# Patient Record
Sex: Female | Born: 1940 | Race: White | Hispanic: No | Smoking: Former smoker
Health system: Southern US, Community
[De-identification: ages and names within clinical notes are randomized; demographics above are authoritative.]

## PROBLEM LIST (undated history)

## (undated) DIAGNOSIS — H9190 Unspecified hearing loss, unspecified ear: Secondary | ICD-10-CM

## (undated) DIAGNOSIS — N183 Chronic kidney disease, stage 3 unspecified: Secondary | ICD-10-CM

## (undated) DIAGNOSIS — I10 Essential (primary) hypertension: Secondary | ICD-10-CM

## (undated) DIAGNOSIS — F329 Major depressive disorder, single episode, unspecified: Secondary | ICD-10-CM

## (undated) DIAGNOSIS — K219 Gastro-esophageal reflux disease without esophagitis: Secondary | ICD-10-CM

## (undated) DIAGNOSIS — H269 Unspecified cataract: Secondary | ICD-10-CM

## (undated) DIAGNOSIS — M542 Cervicalgia: Secondary | ICD-10-CM

## (undated) DIAGNOSIS — G2581 Restless legs syndrome: Secondary | ICD-10-CM

## (undated) DIAGNOSIS — J45909 Unspecified asthma, uncomplicated: Secondary | ICD-10-CM

## (undated) DIAGNOSIS — M1990 Unspecified osteoarthritis, unspecified site: Secondary | ICD-10-CM

## (undated) DIAGNOSIS — R200 Anesthesia of skin: Secondary | ICD-10-CM

## (undated) DIAGNOSIS — F32A Depression, unspecified: Secondary | ICD-10-CM

## (undated) DIAGNOSIS — M5430 Sciatica, unspecified side: Secondary | ICD-10-CM

## (undated) HISTORY — DX: Chronic kidney disease, stage 3 (moderate): N18.3

## (undated) HISTORY — DX: Unspecified hearing loss, unspecified ear: H91.90

## (undated) HISTORY — PX: TOOTH EXTRACTION: SUR596

## (undated) HISTORY — DX: Unspecified cataract: H26.9

## (undated) HISTORY — PX: EYE SURGERY: SHX253

## (undated) HISTORY — DX: Depression, unspecified: F32.A

## (undated) HISTORY — PX: OTHER SURGICAL HISTORY: SHX169

## (undated) HISTORY — DX: Cervicalgia: M54.2

## (undated) HISTORY — DX: Essential (primary) hypertension: I10

## (undated) HISTORY — DX: Anesthesia of skin: R20.0

## (undated) HISTORY — DX: Unspecified osteoarthritis, unspecified site: M19.90

## (undated) HISTORY — DX: Chronic kidney disease, stage 3 unspecified: N18.30

## (undated) HISTORY — DX: Gastro-esophageal reflux disease without esophagitis: K21.9

## (undated) HISTORY — DX: Unspecified asthma, uncomplicated: J45.909

## (undated) HISTORY — DX: Restless legs syndrome: G25.81

## (undated) HISTORY — PX: CATARACT EXTRACTION, BILATERAL: SHX1313

## (undated) HISTORY — DX: Sciatica, unspecified side: M54.30

## (undated) HISTORY — DX: Major depressive disorder, single episode, unspecified: F32.9

---

## 1999-07-12 ENCOUNTER — Encounter: Payer: Self-pay | Admitting: Family Medicine

## 1999-07-12 ENCOUNTER — Ambulatory Visit (HOSPITAL_COMMUNITY): Admission: RE | Admit: 1999-07-12 | Discharge: 1999-07-12 | Payer: Self-pay | Admitting: Family Medicine

## 1999-08-28 ENCOUNTER — Other Ambulatory Visit: Admission: RE | Admit: 1999-08-28 | Discharge: 1999-08-28 | Payer: Self-pay | Admitting: Obstetrics & Gynecology

## 1999-08-28 ENCOUNTER — Encounter (INDEPENDENT_AMBULATORY_CARE_PROVIDER_SITE_OTHER): Payer: Self-pay

## 2000-07-31 ENCOUNTER — Ambulatory Visit (HOSPITAL_COMMUNITY): Admission: RE | Admit: 2000-07-31 | Discharge: 2000-07-31 | Payer: Self-pay | Admitting: Family Medicine

## 2000-07-31 ENCOUNTER — Encounter: Payer: Self-pay | Admitting: Family Medicine

## 2001-08-19 ENCOUNTER — Ambulatory Visit (HOSPITAL_COMMUNITY): Admission: RE | Admit: 2001-08-19 | Discharge: 2001-08-19 | Payer: Self-pay | Admitting: Family Medicine

## 2001-08-19 ENCOUNTER — Encounter: Payer: Self-pay | Admitting: Family Medicine

## 2002-09-13 ENCOUNTER — Encounter: Payer: Self-pay | Admitting: Family Medicine

## 2002-09-13 ENCOUNTER — Ambulatory Visit (HOSPITAL_COMMUNITY): Admission: RE | Admit: 2002-09-13 | Discharge: 2002-09-13 | Payer: Self-pay | Admitting: Family Medicine

## 2003-09-26 ENCOUNTER — Ambulatory Visit (HOSPITAL_COMMUNITY): Admission: RE | Admit: 2003-09-26 | Discharge: 2003-09-26 | Payer: Self-pay | Admitting: Family Medicine

## 2004-03-06 ENCOUNTER — Ambulatory Visit (HOSPITAL_COMMUNITY): Admission: RE | Admit: 2004-03-06 | Discharge: 2004-03-06 | Payer: Self-pay | Admitting: Gastroenterology

## 2004-10-30 ENCOUNTER — Ambulatory Visit (HOSPITAL_COMMUNITY): Admission: RE | Admit: 2004-10-30 | Discharge: 2004-10-30 | Payer: Self-pay | Admitting: Family Medicine

## 2004-11-13 ENCOUNTER — Encounter: Admission: RE | Admit: 2004-11-13 | Discharge: 2004-11-13 | Payer: Self-pay | Admitting: Family Medicine

## 2005-12-30 ENCOUNTER — Encounter: Admission: RE | Admit: 2005-12-30 | Discharge: 2005-12-30 | Payer: Self-pay | Admitting: Family Medicine

## 2006-07-31 ENCOUNTER — Ambulatory Visit (HOSPITAL_BASED_OUTPATIENT_CLINIC_OR_DEPARTMENT_OTHER): Admission: RE | Admit: 2006-07-31 | Discharge: 2006-07-31 | Payer: Self-pay | Admitting: Orthopedic Surgery

## 2007-02-04 ENCOUNTER — Encounter: Admission: RE | Admit: 2007-02-04 | Discharge: 2007-02-04 | Payer: Self-pay | Admitting: Family Medicine

## 2007-03-19 ENCOUNTER — Ambulatory Visit: Payer: Self-pay | Admitting: Critical Care Medicine

## 2007-04-30 ENCOUNTER — Ambulatory Visit: Payer: Self-pay | Admitting: Critical Care Medicine

## 2008-03-01 ENCOUNTER — Encounter: Admission: RE | Admit: 2008-03-01 | Discharge: 2008-03-01 | Payer: Self-pay | Admitting: Family Medicine

## 2009-03-28 ENCOUNTER — Encounter: Admission: RE | Admit: 2009-03-28 | Discharge: 2009-03-28 | Payer: Self-pay | Admitting: Family Medicine

## 2010-02-05 ENCOUNTER — Encounter: Admission: RE | Admit: 2010-02-05 | Discharge: 2010-02-05 | Payer: Self-pay | Admitting: Family Medicine

## 2010-12-14 ENCOUNTER — Encounter: Payer: Self-pay | Admitting: Family Medicine

## 2011-03-25 ENCOUNTER — Other Ambulatory Visit: Payer: Self-pay | Admitting: Family Medicine

## 2011-03-25 DIAGNOSIS — Z1231 Encounter for screening mammogram for malignant neoplasm of breast: Secondary | ICD-10-CM

## 2011-03-27 ENCOUNTER — Ambulatory Visit
Admission: RE | Admit: 2011-03-27 | Discharge: 2011-03-27 | Disposition: A | Discharge status: Home / Self Care | Payer: Medicare Other | Source: Ambulatory Visit | Attending: Family Medicine | Admitting: Family Medicine

## 2011-03-27 DIAGNOSIS — Z1231 Encounter for screening mammogram for malignant neoplasm of breast: Secondary | ICD-10-CM

## 2011-04-08 NOTE — Assessment & Plan Note (Signed)
[facility]                             PULMONARY OFFICE NOTE   MEESHA, SEK                      MRN:          [id number]  DATE:04/30/2007                            DOB:          12-14-1940    Ms. Wisner returns in followup. A 70 year old female with asthmatic  bronchitis, chronic obstructive lung disease, reflux disease. She states  the Zegerid has not been much better than the omeprazole that she was on  previously. She is on;  1. Advair 500/50 one spray b.i.d.  2. Spiriva daily.  3. Singular 10 mg daily.  4. She is off __________ .   On exam, temperature 98, blood pressure 154/80, pulse 91, saturation 95%  room air.  CHEST: Showed few scattered expired wheezes, poor air flow.  CARDIAC EXAM: Showed a regular rate and rhythm without S3. Normal S1,  S2.  ABDOMEN: Soft, nontender.  EXTREMITIES: Showed no edema or clubbing.  SKIN: Clear.   IMPRESSION:  That of acute bronchitis with chronic obstructive pulmonary  disease flare.   PLAN:  For the patient to receive doxicycline 100 mg twice day for 7  days, pulse prednisone 40 mg a day, taper down by 1 every 4 days until  off. She will discontinue Zegerid and restart omeprazole 20 mg b.i.d.  Maintain Advair and Spiriva as is, and we will see the patient back in  follow up.     Charlcie Cradle Delford Field, MD, [facility]  Electronically Signed    PEW/MedQ  DD: 04/30/2007  DT: 04/30/2007  Job #: [id number]   cc:   Gregary Signs A. Everardo All, MD

## 2011-04-11 NOTE — Op Note (Signed)
[name], [name]                         ACCOUNT NO.:  [id number]   MEDICAL RECORD NO.:  [id number]                   PATIENT TYPE:  AMB   LOCATION:  ENDO                                 FACILITY:  [facility]   PHYSICIAN:  Anselmo Rod, M.D.               DATE OF BIRTH:  03-08-1941   DATE OF PROCEDURE:  03/06/2004  DATE OF DISCHARGE:                                 OPERATIVE REPORT   PROCEDURE PERFORMED:  Screening colonoscopy.   ENDOSCOPIST:  Charna Elizabeth, M.D.   INSTRUMENT USED:  Olympus video colonoscope.   INDICATIONS FOR PROCEDURE:  The patient is a 70 year old white female  undergoing screening colonoscopy to rule out colonic polyps, masses, etc.   PREPROCEDURE PREPARATION:  Informed consent was procured from the patient.  The patient was fasted for eight hours prior to the procedure and prepped  with a bottle of magnesium citrate and a gallon of GoLYTELY the night prior  to the procedure.   PREPROCEDURE PHYSICAL:  The patient had stable vital signs.  Neck supple.  Chest clear to auscultation.  S1 and S2 regular.  Abdomen soft with normal  bowel sounds.   DESCRIPTION OF PROCEDURE:  The patient was placed in left lateral decubitus  position and sedated with 125 mg of Demerol and 15 mg of Versed  intravenously.  Once the patient was adequately sedated and maintained on  low flow oxygen and continuous cardiac monitoring, the Olympus video  colonoscope was advanced from the rectum to sigmoid colon with difficulty.  There was extensive diverticulosis of the sigmoid colon with spasm.  The  patient's position was changed in several locations from the left lateral to  the supine and the right lateral position with gentle application of  abdominal pressure.  The adult scope was then changed to an adjustable  pediatric scope and the scope gently advanced up to the cecum.  There was a  large amount of residual stool in the colon and multiple washes were done.  Small lesions  could have been missed.  The patient tolerated the procedure  well without immediate complications.  Retroflexion in the rectum revealed  no abnormalities.   IMPRESSION:  1. Extensive diverticulosis with most prominent changes in the sigmoid     colon.  2. No masses or polyps seen.  3. A large amount of residual stool in the colon.  Small lesions could have     been missed.  The patient had a very difficult procedure.   RECOMMENDATIONS:  1. Repeat barium enema should be done in the next five years unless the     patient develops any abnormal symptoms     in the interim.  2. Continue high fiber diet with liberal fluid intake.  Brochures on     diverticulosis have been given to the patient for her education.  Anselmo Rod, M.D.    JNM/MEDQ  D:  03/06/2004  T:  03/07/2004  Job:  [id number]   cc:   Marjory Lies, M.D.  [address]  [place]  [place] [address]  Fax: [phone]

## 2011-04-11 NOTE — Assessment & Plan Note (Signed)
[facility]                             PULMONARY OFFICE NOTE   HUNTLEIGH, DOOLEN                      MRN:          [id number]  DATE:03/19/2007                            DOB:          1941-09-27    CHIEF COMPLAINT:  Evaluation asthma.   HISTORY OF PRESENT ILLNESS:  A 70 year old white female who I have  actually seen previously in 1996, and at that time diagnosed this  patient as having COPD and chronic asthmatic bronchitis with smoking.  She smoked 39 years, a pack a day.  She did eventually quit in 1999.  I  have not seen her in over 10 years.  In the interim, she has had dry  cough with chest discomfort and pain with coughing and also reflux  symptom-like complex.  She breaks through with acid heartburn on the  omeprazole at 20 mg b.i.d.  She has chest tightness, there is no  wheezing.  She is short of breath with exertion.  She is not short of  breath at rest.  She does have some postnasal drainage.  She has been on  Advair for 4 years, 250/50 strength 1 spray b.i.d.  She is also on the  Singulair 10 mg daily, and mucous relief over the counter 2 every 8  hours.  She also maintains Uniphyl 800 mg daily.  She is referred for  further evaluation.   PAST MEDICAL HISTORY:  1. Medical history of asthmatic bronchitis, diagnosed since 41.  2. History of chronic allergies.  3. Acid reflux disease.  4. Arm surgery for dog bite.  No major surgical issues noted other      than this.   SOCIAL HISTORY:  Retired, lives with her husband.   FAMILY HISTORY:  Heart disease in mother, father had MI.  Mother had  clotting disorder.  Sister had cancer.   REVIEW OF SYSTEMS:  Otherwise noncontributory.   CURRENT MEDICATIONS:  1. Advair 250/50 one spray b.i.d.  2. Bupropion XL 300 mg daily.  3. Allegra 180 mg daily.  4. Fosamax weekly.  5. Omeprazole 20 mg b.i.d.  6. Singulair 10 mg daily.  7. Uniphyl 800 mg daily.  8. Aspirin 81 mg daily.  9.  Magnesium daily.  10.Glucosamine 2 daily.  11.Mucous relief 2 every 8 hours.  12.Tylenol p.r.n.   LABORATORY DATA:  Chest x-ray was obtained and reviewed and showed COPD  changes but no acute infiltrates seen.  Spirometry was obtained today in  the office showing an FEV1 of 1.83, FEC of 2.62, FEV1 FEC ratio 70%.   IMPRESSION:  1. Asthmatic bronchitis, ex-smoker.  2. Chronic obstructive lung disease.  3. Severe reflux disease, exacerbated by Uniphyl use.   PLAN:  Add Spiriva 1 capsule 2 sprays daily, discontinue further  Uniphyl.  Increase the Advair to 500/50 one spray b.i.d.  Patient was  reinstructed as to the proper use of Advair and the Spiriva, and we will  see this patient back in return followup in 1 month.     Charlcie Cradle Delford Field, MD, [facility]  Electronically Signed    PEW/MedQ  DD: 03/19/2007  DT: 03/19/2007  Job #: [id number]   cc:   Marjory Lies, M.D.

## 2011-04-11 NOTE — Op Note (Signed)
[name], [name]             ACCOUNT NO.:  [id number]   MEDICAL RECORD NO.:  [id number]          PATIENT TYPE:  AMB   LOCATION:  DSC                          FACILITY:  [facility]   PHYSICIAN:  Katy Fitch. Sypher, M.D. DATE OF BIRTH:  25-Aug-1941   DATE OF PROCEDURE:  07/31/2006  DATE OF DISCHARGE:                                 OPERATIVE REPORT   PREOPERATIVE DIAGNOSES:  Complex dog bite wounds right forearm sustained on  July 28, 2006 with prior debridement, whirlpool therapy and antibiotic  therapy x72 hours, now presenting for delayed primary closure of stable  wounds.   POSTOPERATIVE DIAGNOSES:  Complex dog bite wounds right forearm sustained on  July 28, 2006 with prior debridement, whirlpool therapy and antibiotic  therapy x72 hours, now presenting for delayed primary closure of stable  wounds.   OPERATION:  1. Excisional debridement of two dorsal wounds right forearm, one      measuring 5 cm in length.  The second measuring 3.5 cm in length with      layered closure with 4-0 Vicryl and intradermal 4-0 Prolene.  2. Closure of three volar forearm wounds with intradermal 3-0 Prolene,      each measuring between 1 and 1.5 cm   SURGEON:  Dr. Josephine Igo.   ASSISTANT:  Annye Rusk PA-C.   ANESTHESIA:  General by LMA, supervising anesthesiologist Dr. Sampson Goon.   INDICATIONS:  [name] is a 65-year woman referred through the  courtesy of Dr. Marjory Lies of [facility] practice for evaluation  and management of untidy dog bite wounds to the right forearm.   Ms. Mount was involved in a altercation when her dog and another dog  became involved in a dog fight on July 28, 2006.  In breaking up the  fight, she sustained a deep bite wound to her right dorsal and volar  forearm.  She believes her own dog was the one that accidentally caused her  injuries.  She had a tearing untidy pair of wounds on the dorsal aspect of  her forearm measuring 5  cm and approximately 3-1/2 cm in length down to the  muscle fascia.  These were very untidy on July 28, 2006.  She had three  significant bite wound puncture wound lacerations measuring between 1.5 and  1 cm on the volar surface of her forearm and 2 smaller superficial bite  wounds.   She was seen on an urgent basis on July 28, 2006 at which time her  wounds were cleaned under local anesthesia in the office and she was treated  with whirlpool therapy.  She has been on oral Augmentin antibiotic therapy  x72 hours and now presents for delayed primary closure of her wounds.   After informed consent, she is brought to the operating room at this time.   DESCRIPTION OF PROCEDURE:  Viktoria Gruetzmacher is brought to the operating room  and placed in supine position on the operating table.  Following an  anesthesia consult by Dr. Sampson Goon, general anesthesia by LMA was  selected.   The right arm was prepped with Betadine soap solution and  sterilely draped.  A pneumatic tourniquet was applied to the proximal right brachium.  Following elevation of the arm for 1 minute, the arterial tourniquet was  inflated to 230 mmHg.  The procedure commenced with excisional debridement  of the inflammatory margins of her dorsal wounds.  These were  circumferentially debrided down to the subcutaneous fat.  They were then  probed with a blunt scissors and irrigated thoroughly.  No purulent material  was recovered.  There was no area of loculation noted.   The wounds were then repaired with subdermal and subcutaneous sutures of 4-0  Vicryl followed by intradermal segmental 4-0 Prolene.  The wounds were then  drained with Vesseloop drains extending from a puncture wound out the main  traumatic wound.   Ms. Speece has extremely fragile senile skin and is at some risk for  secondary wound breakdown due to the poor quality of her skin.   The volar wounds were then addressed.  All three wounds were  irrigated and  probed.  No purulent material was recovered.  The wounds were then repaired  with intradermal 4-0 Prolene.   All wounds were dressed with Adaptic sterile gauze, ABD pads and an Ace  wrap.  There were no apparent complications.   Ms. Hockenberry was given 1 gram of Ancef as a supplementary IV antibiotic  intraoperatively.  She will continue with her Augmentin 875 mg p.o. b.i.d.  for a total of 7 days.  She will return to see [place] in follow-up in the office  on August 03, 2006 for a wound check or contact us sooner p.r.n. problems  with her medication, fever or any potential wound complication.      Katy Fitch Sypher, M.D.  Electronically Signed     RVS/MEDQ  D:  07/31/2006  T:  07/31/2006  Job:  [id number]   cc:   Marjory Lies, M.D.

## 2013-06-23 ENCOUNTER — Other Ambulatory Visit: Payer: Self-pay

## 2013-06-23 DIAGNOSIS — Z1231 Encounter for screening mammogram for malignant neoplasm of breast: Secondary | ICD-10-CM

## 2013-06-30 ENCOUNTER — Ambulatory Visit
Admission: RE | Admit: 2013-06-30 | Discharge: 2013-06-30 | Disposition: A | Discharge status: Home / Self Care | Payer: Medicare Other | Source: Ambulatory Visit

## 2013-06-30 DIAGNOSIS — Z1231 Encounter for screening mammogram for malignant neoplasm of breast: Secondary | ICD-10-CM

## 2014-06-14 ENCOUNTER — Other Ambulatory Visit: Payer: Self-pay | Admitting: Otolaryngology

## 2014-06-14 DIAGNOSIS — M542 Cervicalgia: Secondary | ICD-10-CM

## 2014-06-29 ENCOUNTER — Ambulatory Visit
Admission: RE | Admit: 2014-06-29 | Discharge: 2014-06-29 | Disposition: A | Discharge status: Home / Self Care | Payer: Medicare Other | Source: Ambulatory Visit | Attending: Otolaryngology | Admitting: Otolaryngology

## 2014-06-29 DIAGNOSIS — M542 Cervicalgia: Secondary | ICD-10-CM

## 2015-10-10 ENCOUNTER — Other Ambulatory Visit: Payer: Self-pay | Admitting: Family Medicine

## 2015-10-10 DIAGNOSIS — Z1231 Encounter for screening mammogram for malignant neoplasm of breast: Secondary | ICD-10-CM

## 2015-10-30 ENCOUNTER — Ambulatory Visit
Admission: RE | Admit: 2015-10-30 | Discharge: 2015-10-30 | Disposition: A | Discharge status: Home / Self Care | Payer: Medicare Other | Source: Ambulatory Visit | Attending: Family Medicine | Admitting: Family Medicine

## 2015-10-30 DIAGNOSIS — Z1231 Encounter for screening mammogram for malignant neoplasm of breast: Secondary | ICD-10-CM

## 2016-06-03 DIAGNOSIS — J449 Chronic obstructive pulmonary disease, unspecified: Secondary | ICD-10-CM | POA: Insufficient documentation

## 2016-06-03 DIAGNOSIS — I10 Essential (primary) hypertension: Secondary | ICD-10-CM | POA: Insufficient documentation

## 2016-06-03 DIAGNOSIS — F329 Major depressive disorder, single episode, unspecified: Secondary | ICD-10-CM | POA: Diagnosis present

## 2016-06-03 DIAGNOSIS — K219 Gastro-esophageal reflux disease without esophagitis: Secondary | ICD-10-CM | POA: Insufficient documentation

## 2016-06-03 DIAGNOSIS — N183 Chronic kidney disease, stage 3 unspecified: Secondary | ICD-10-CM | POA: Diagnosis present

## 2016-06-03 DIAGNOSIS — H9190 Unspecified hearing loss, unspecified ear: Secondary | ICD-10-CM | POA: Insufficient documentation

## 2017-07-30 ENCOUNTER — Encounter: Payer: Self-pay | Admitting: Neurology

## 2017-07-30 ENCOUNTER — Ambulatory Visit (INDEPENDENT_AMBULATORY_CARE_PROVIDER_SITE_OTHER): Payer: Medicare Other | Admitting: Neurology

## 2017-07-30 DIAGNOSIS — M545 Low back pain, unspecified: Secondary | ICD-10-CM | POA: Insufficient documentation

## 2017-07-30 DIAGNOSIS — G8929 Other chronic pain: Secondary | ICD-10-CM | POA: Diagnosis not present

## 2017-07-30 DIAGNOSIS — R202 Paresthesia of skin: Secondary | ICD-10-CM | POA: Diagnosis not present

## 2017-07-30 MED ORDER — GABAPENTIN 100 MG PO CAPS: 100.0000 mg | ORAL_CAPSULE | Freq: Three times a day (TID) | ORAL | 11 refills | Status: DC

## 2017-07-30 NOTE — Progress Notes (Signed)
PATIENT: [name] DOB: 03-Nov-1941  Chief Complaint  Patient presents with  . Numbness    Reports having numbness on the bottom of both feet.  She is also experiencing body restlessness.  She is taking ibuprofen PM at night due to her symptoms interrupting her sleep.  Marland Kitchen PCP    [name] Found, MD     HISTORICAL  [name] is [name] 76 year old female, seen in refer by her primary care doctor  [name], for evaluation of numbness in the bottom of her feet, initial evaluation was on September 6th 2018.  I reviewed and summarized referring note, she has past medical history of hypertension, asthma, depression, acid reflux, restless leg,  She reported history of chronic low back pain, around 2015 she noticed numbness of her left foot, starting at the plantar surface, gradually had involvement of her whole left foot, since January 2016, she also noticed similar involvement of right foot, she described intermittent rising sensation starting from bottom of her feet, spreading rostrally to involving her ankle, leg, spine, pelvic area, even to chest level, the rising sensation described as hot flashing, like menopause, lasting for few minutes, then gradually subsided, she denied loss of consciousness, it only happened in the sitting position, when she tries to go to bed, she tends to having her feet on the floor, or rubbing her feet with over-the-counter cream  She also complains of chronic neck pain, was diagnosed with right ulnar neuropathy when she presented with right fifth finger numbness, she had right ulnar transposition of surgery without helping her right fifth finger paresthesia  She denies significant gait abnormality and no bowel bladder incontinence  In between episodes, she denies significant pain, no bowel and bladder incontinence,  Laboratory evaluations B12 515, normal CMP, CBC, hemoglobin 14 point 3, A1C 5.3  REVIEW OF SYSTEMS: Full 14 system review of  systems performed and notable only for restless leg, numbness, hearing loss, ringing ears,   ALLERGIES: No Known Allergies  HOME MEDICATIONS: Current Outpatient Prescriptions  Medication Sig Dispense Refill  . Ascorbic Acid (VITAMIN C) 1000 MG tablet Take 1,000 mg by mouth.    Marland Kitchen aspirin EC 81 MG tablet Take by mouth.    . B Complex Vitamins (VITAMIN-B COMPLEX) TABS Take by mouth.    Marland Kitchen buPROPion (WELLBUTRIN XL) 300 MG 24 hr tablet TAKE 1 TABLET BY MOUTH  DAILY    . CALCIUM PO Take 1,200 mg by mouth.    . Dextromethorphan-Guaifenesin (MUCINEX DM PO) Take by mouth as needed.    . diphenhydrAMINE (BENADRYL) 25 mg capsule Take 100 mg by mouth daily.    Marland Kitchen esomeprazole (NEXIUM) 40 MG capsule TAKE 1 CAPSULE BY MOUTH  DAILY    . Fluticasone-Salmeterol (ADVAIR DISKUS) 500-50 MCG/DOSE AEPB USE 1 INHALATION TWO TIMES  DAILY    . Glucosamine-Chondroitin (MOVE FREE PO) Take 1 tablet by mouth daily.    Marland Kitchen ibuprofen (ADVIL,MOTRIN) 200 MG tablet Take 200 mg by mouth as needed.    . Ibuprofen-Diphenhydramine Cit (IBUPROFEN PM PO) Take 2 tablets by mouth at bedtime.    Marland Kitchen losartan-hydrochlorothiazide (HYZAAR) 50-12.5 MG tablet TAKE 1 TABLET BY MOUTH  DAILY AS DIRECTED    . MAGNESIUM PO Take 3 tablets by mouth daily.    . montelukast (SINGULAIR) 10 MG tablet TAKE 1 TABLET BY MOUTH  DAILY AS DIRECTED    . Multiple Vitamin (MULTI-VITAMINS) TABS Take by mouth.    . Omega-3 Fatty Acids (FISH OIL PO) Take  2,400 mg by mouth daily.    Marland Kitchen SIMETHICONE PO Take 180 mg by mouth as needed.    . theophylline (UNIPHYL) 400 MG 24 hr tablet TAKE 1 TABLET BY MOUTH  DAILY AS DIRECTED    . vitamin E 400 UNIT capsule Take by mouth.     No current facility-administered medications for this visit.     PAST MEDICAL HISTORY: Past Medical History:  Diagnosis Date  . Arthritis   . Asthma   . Chronic renal impairment, stage 3 (moderate)   . Depression   . GERD (gastroesophageal reflux disease)   . Hearing loss   .  Hypertension   . Neck pain   . Numbness   . Restless legs   . Sciatica     PAST SURGICAL HISTORY: Past Surgical History:  Procedure Laterality Date  . arm surgery     due to dog bite  . CATARACT EXTRACTION, BILATERAL    . OTHER SURGICAL HISTORY     bone graft - jaw   . TOOTH EXTRACTION      FAMILY HISTORY: Family History  Problem Relation Age of Onset  . Stroke Mother   . Hypertension Father   . Heart disease Father   . Heart attack Father   . Cancer Sister        unsure of type    SOCIAL HISTORY:  Social History   Social History  . Marital status: Married    Spouse name: N/[name]  . Number of children: 2  . Years of education: associates degree   Occupational History  . Retired    Social History Main Topics  . Smoking status: Former Smoker    Quit date: 1999  . Smokeless tobacco: Never Used  . Alcohol use Yes     Comment: 2 drinks per day  . Drug use: No  . Sexual activity: Not on file   Other Topics Concern  . Not on file   Social History Narrative   Lives at home with husband.   Right-handed.   No caffeine use.     PHYSICAL EXAM   Vitals:   07/30/17 1057  BP: (!) 178/98  Pulse: 98  Weight: 189 lb (85.7 kg)  Height:  (1.651 m)    Not recorded      Body mass index is 31.45 kg/m.  PHYSICAL EXAMNIATION:  Gen: NAD, conversant, well nourised, obese, well groomed                     Cardiovascular: Regular rate rhythm, no peripheral edema, warm, nontender. Eyes: Conjunctivae clear without exudates or hemorrhage Neck: Supple, no carotid bruits. Pulmonary: Clear to auscultation bilaterally   NEUROLOGICAL EXAM:  MENTAL STATUS: Speech:    Speech is normal; fluent and spontaneous with normal comprehension.  Cognition:     Orientation to time, place and person     Normal recent and remote memory     Normal Attention span and concentration     Normal Language, naming, repeating,spontaneous speech     Fund of knowledge   CRANIAL  NERVES: CN II: Visual fields are full to confrontation. Fundoscopic exam is normal with sharp discs and no vascular changes. Pupils are round equal and briskly reactive to light. CN III, IV, VI: extraocular movement are normal. No ptosis. CN V: Facial sensation is intact to pinprick in all 3 divisions bilaterally. Corneal responses are intact.  CN VII: Face is symmetric with normal eye closure and smile. CN  VIII: Mildly decreased hearing bilaterally  CN IX, X: Palate elevates symmetrically. Phonation is normal. CN XI: Head turning and shoulder shrug are intact CN XII: Tongue is midline with normal movements and no atrophy.  MOTOR: There is no pronator drift of out-stretched arms. Muscle bulk and tone are normal. Muscle strength is normal.  REFLEXES: Reflexes are 2+ and symmetric at the biceps, triceps, knees, and absent at ankles.Plantar responses are flexor.  SENSORY: Intact to light touch, pinprick, positional sensation and vibratory sensation are intact in fingers, with exception of decreased light touch at the right fifth finger, decreased vibratory sensation at toes   COORDINATION: Rapid alternating movements and fine finger movements are intact. There is no dysmetria on finger-to-nose and heel-knee-shin.    GAIT/STANCE: Posture is normal. Gait is steady with normal steps, base, arm swing, and turning. Heel and toe walking are normalMild difficulty with tandem walking.  Romberg is absent.   DIAGNOSTIC DATA (LABS, IMAGING, TESTING) - I reviewed patient records, labs, notes, testing and imaging myself where available.   ASSESSMENT AND PLAN  [name] is [name] 76 y.o. female   Bilateral lower extremity paresthesia, chronic low back pain Differentiation diagnosis including peripheral neuropathy versus lumbosacral radiculopathy  Proceed with MRI of lumbar spine  EMG nerve conduction study   Gabapentin 100 mg 3 times [name] day    Levert FeinsteinYijun Krystena Reitter, M.D. Ph.D.  [facility] [address], [address] [place], [place] [address] Ph: [phone] Fax: [phone]  CC: [name] FoundEksir, Samantha A, MD

## 2017-08-14 ENCOUNTER — Encounter: Payer: Medicare Other | Admitting: Neurology

## 2017-08-19 ENCOUNTER — Ambulatory Visit
Admission: RE | Admit: 2017-08-19 | Discharge: 2017-08-19 | Disposition: A | Discharge status: Home / Self Care | Payer: Medicare Other | Source: Ambulatory Visit | Attending: Neurology | Admitting: Neurology

## 2017-08-19 DIAGNOSIS — M545 Low back pain: Secondary | ICD-10-CM | POA: Diagnosis not present

## 2017-08-19 DIAGNOSIS — R202 Paresthesia of skin: Secondary | ICD-10-CM

## 2017-08-19 DIAGNOSIS — G8929 Other chronic pain: Secondary | ICD-10-CM

## 2017-08-21 ENCOUNTER — Telehealth: Payer: Self-pay | Admitting: Neurology

## 2017-08-21 NOTE — Telephone Encounter (Signed)
  Please call patient MRI of lumbar spine showed multilevel degenerative changes, with evidence of spinal stenosis at L4-5, L3-4, and evidence of variable foraminal stenosis, I will review MRI films at her next visit on August 28 2017  IMPRESSION:  Abnormal MRI lumbar spine (without) demonstrating: 1. At L4-5: disc bulging and facet hypertrophy with severe spinal stenosis and mild biforaminal stenosis  2. At L3-4: disc bulging and facet hypertrophy with moderate-severe spinal stenosis and mild biforaminal stenosis  3. At L2-3: disc bulging and facet hypertrophy with mild spinal stenosis and no foraminal stenosis

## 2017-08-24 NOTE — Telephone Encounter (Signed)
Attempted call again.  Left another message requesting a return call.

## 2017-08-24 NOTE — Telephone Encounter (Signed)
Left message for patient. 10/1/20184:54 PM  mck

## 2017-08-25 NOTE — Telephone Encounter (Signed)
Left third message for patient.  Provided our call back number if she would like to discuss result over phone. Otherwise, she has an appt on 08/28/17 and Dr. Terrace Arabia will review them at that time.

## 2017-08-28 ENCOUNTER — Ambulatory Visit (INDEPENDENT_AMBULATORY_CARE_PROVIDER_SITE_OTHER): Payer: Medicare Other | Admitting: Neurology

## 2017-08-28 DIAGNOSIS — G8929 Other chronic pain: Secondary | ICD-10-CM | POA: Diagnosis not present

## 2017-08-28 DIAGNOSIS — M545 Low back pain: Secondary | ICD-10-CM | POA: Diagnosis not present

## 2017-08-28 DIAGNOSIS — R202 Paresthesia of skin: Secondary | ICD-10-CM

## 2017-08-28 DIAGNOSIS — M5416 Radiculopathy, lumbar region: Secondary | ICD-10-CM | POA: Diagnosis not present

## 2017-08-28 DIAGNOSIS — G629 Polyneuropathy, unspecified: Secondary | ICD-10-CM | POA: Insufficient documentation

## 2017-08-28 DIAGNOSIS — G6289 Other specified polyneuropathies: Secondary | ICD-10-CM | POA: Diagnosis not present

## 2017-08-28 NOTE — Procedures (Signed)
Full Name: [name] Gender: Female MRN #: [id number] Date of Birth: November 27, 1940    Visit Date: 08/28/2017 10:07 Age: 76 Years 1 Months Old Examining Physician: Levert Feinstein, MD  Referring Physician: Terrace Arabia, MD History: 76 years old female complains of bilateral feet paresthesia, radiating pain to bilateral upper extremity, she also has chronic low back pain  Summary of the test:  Nerve conduction study: Bilateral sural, superficial peroneal sensory responses were absent.  Left median, ulnar sensory responses were normal.  Bilateral tibial, left peroneal to EDB motor responses showed severely decreased C map amplitude. Right peroneal to EDB motor response was absent.  Left ulnar motor responses were normal. Right median motor responses showed mildly decreased to C map amplitude.  Electromyography: Selected needle examination of bilateral Lower extremity muscles and bilateral lumbar sacral paraspinal muscle were performed.  There is evidence of chronic neuropathic changes involving bilateral lower extremity muscles, bilateral tibialis anterior, tibialis posterior, vastus lateralis. There is no evidence of active denervation and bilateral lumbar sacral paraspinal muscles.    Conclusion:  This is an abnormal study. There is electrodiagnostic evidence of mild length dependent axonal peripheral neuropathy, there is also evidence of chronic bilateral lumbosacral radiculopathy. There is no evidence of active process.   ------------------------------- Levert Feinstein, M.D.  [facility] [address] [place], [place] [address] Tel: [phone] Fax: [phone]        [facility]    Nerve / Sites Muscle Latency Ref. Amplitude Ref. Rel Amp Segments Distance Velocity Ref. Area    ms ms mV mV %  cm m/s m/s mVms  L Median - APB     Wrist APB 3.9 ?4.4 3.1 ?4.0 100 Wrist - APB 7   8.2     Upper arm APB 8.2  2.9  94.1 Upper arm - Wrist 24 55 ?49 7.1  L Ulnar - ADM     Wrist  ADM 2.6 ?3.3 9.6 ?6.0 100 Wrist - ADM 7   28.5     B.Elbow ADM 6.1  8.3  86.6 B.Elbow - Wrist 19 54 ?49 25.7     A.Elbow ADM 8.1  7.3  87.5 A.Elbow - B.Elbow 10 51 ?49 24.7         A.Elbow - Wrist      R Peroneal - EDB     Ankle EDB NR ?6.5 NR ?2.0 NR Ankle - EDB 9   NR     Fib head EDB NR  NR  NR Fib head - Ankle 31 NR ?44 NR         Pop fossa - Ankle      L Peroneal - EDB     Ankle EDB 5.2 ?6.5 0.3 ?2.0 100 Ankle - EDB 9   0.8     Fib head EDB 12.6  0.2  64.4 Fib head - Ankle 32 43 ?44 0.5     Pop fossa EDB 14.9  0.1  76.7 Pop fossa - Fib head 10 44 ?44 0.3         Pop fossa - Ankle      R Tibial - AH     Ankle AH 6.0 ?5.8 0.6 ?4.0 100 Ankle - AH 9   2.0     Pop fossa AH NR  NR  NR Pop fossa - Ankle   ?41 NR  L Tibial - AH     Ankle AH 5.3 ?5.8 0.3 ?4.0 100 Ankle - AH 9  1.5     Pop fossa AH NR  NR  NR Pop fossa - Ankle   ?41 NR                 SNC    Nerve / Sites Rec. Site Peak Lat Ref.  Amp Ref. Segments Distance Peak Diff Ref.    ms ms V V  cm ms ms  L Sural - Ankle (Calf)     Calf Ankle NR ?4.4 NR ?6 Calf - Ankle 14    R Sural - Ankle (Calf)     Calf Ankle NR ?4.4 NR ?6 Calf - Ankle 14    L Superficial peroneal - Ankle     Lat leg Ankle NR ?4.4 NR ?6 Lat leg - Ankle 14    R Superficial peroneal - Ankle     Lat leg Ankle NR ?4.4 NR ?6 Lat leg - Ankle 14    L Median, Ulnar - Transcarpal comparison     Median Palm Wrist 2.1 ?2.2 21 ?35 Median Palm - Wrist 8       Ulnar Palm Wrist 1.9 ?2.2 13 ?12 Ulnar Palm - Wrist 8          Median Palm - Ulnar Palm  0.2 ?0.4  L Median - Orthodromic (Dig II, Mid palm)     Dig II Wrist 3.2 ?3.4 12 ?10 Dig II - Wrist 13    L Ulnar - Orthodromic, (Dig V, Mid palm)     Dig V Wrist 2.7 ?3.1 8 ?5 Dig V - Wrist 20                     F  Wave    Nerve F Lat Ref.   ms ms  L Ulnar - ADM 29.3 ?32.0       EMG full       EMG Summary Table    Spontaneous MUAP Recruitment  Muscle IA Fib PSW Fasc Other Amp Dur. Poly Pattern  R. Tibialis  anterior Normal None None None _______ Normal Normal Normal Reduced  R. Peroneus longus Normal None None None _______ Normal Normal Normal Reduced  R. Vastus lateralis Normal None None None _______ Normal Normal Normal Reduced  L. Tibialis anterior Normal None None None _______ Normal Normal Normal Reduced  L. Tibialis posterior Normal None None None _______ Normal Normal Normal Reduced   L. Vastus lateralis Normal None None None _______ Normal Normal Normal Reduced  R. Lumbar paraspinals (low) Normal None None None _______ Normal Normal Normal Normal  R. Lumbar paraspinals (mid) Normal None None None _______ Normal Normal Normal Normal  L. Lumbar paraspinals (low) Normal None None None _______ Normal Normal Normal Normal  L. Lumbar paraspinals (mid) Normal None None None _______ Normal Normal Normal Normal

## 2017-08-28 NOTE — Progress Notes (Signed)
PATIENT: [name] DOB: [birth year]  HISTORICAL  [name] is a 76 year old female, seen in refer by her primary care doctor  Brett Fairy A, for evaluation of numbness in the bottom of her feet, initial evaluation was on September 6th 2018.  I reviewed and summarized referring note, she has past medical history of hypertension, asthma, depression, acid reflux, restless leg,  She reported history of chronic low back pain, around 2015 she noticed numbness of her left foot, starting at the plantar surface, gradually had involvement of her whole left foot, since January 2016, she also noticed similar involvement of right foot, she described intermittent rising sensation starting from bottom of her feet, spreading rostrally to involving her ankle, leg, spine, pelvic area, even to chest level, the rising sensation described as hot flashing, like menopause, lasting for few minutes, then gradually subsided, she denied loss of consciousness, it only happened in the sitting position, when she tries to go to bed, she tends to having her feet on the floor, or rubbing her feet with over-the-counter cream  She also complains of chronic neck pain, was diagnosed with right ulnar neuropathy when she presented with right fifth finger numbness, she had right ulnar transposition of surgery without helping her right fifth finger paresthesia  She denies significant gait abnormality and no bowel bladder incontinence  In between episodes, she denies significant pain, no bowel and bladder incontinence,  Laboratory evaluations B12 515, normal CMP, CBC, hemoglobin 14 point 3, A1C 5.3  UPDATE Aug 28 2017: she returned for electrodiagnostic study today, which showed evidence of mild axonal sensorimotor polyneuropathy, chronic bilateral lumbosacral radiculopathy  We have personally reviewed MRI of lumbar spine: Evidence of multilevel degenerative changes, 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  She has taking gabapentin 100 mg 3 times a day, which has been helpful  REVIEW OF SYSTEMS: Full 14 system review of systems performed and notable only for as above  ALLERGIES: No Known Allergies  HOME MEDICATIONS: Current Outpatient Prescriptions  Medication Sig Dispense Refill  . Ascorbic Acid (VITAMIN C) 1000 MG tablet Take 1,000 mg by mouth.    Marland Kitchen aspirin EC 81 MG tablet Take by mouth.    . B Complex Vitamins (VITAMIN-B COMPLEX) TABS Take by mouth.    Marland Kitchen buPROPion (WELLBUTRIN XL) 300 MG 24 hr tablet TAKE 1 TABLET BY MOUTH  DAILY    . CALCIUM PO Take 1,200 mg by mouth.    . Dextromethorphan-Guaifenesin (MUCINEX DM PO) Take by mouth as needed.    . diphenhydrAMINE (BENADRYL) 25 mg capsule Take 100 mg by mouth daily.    Marland Kitchen esomeprazole (NEXIUM) 40 MG capsule TAKE 1 CAPSULE BY MOUTH  DAILY    . Fluticasone-Salmeterol (ADVAIR DISKUS) 500-50 MCG/DOSE AEPB USE 1 INHALATION TWO TIMES  DAILY    . gabapentin (NEURONTIN) 100 MG capsule Take 1 capsule (100 mg total) by mouth 3 (three) times daily. 90 capsule 11  . Glucosamine-Chondroitin (MOVE FREE PO) Take 1 tablet by mouth daily.    Marland Kitchen ibuprofen (ADVIL,MOTRIN) 200 MG tablet Take 200 mg by mouth as needed.    . Ibuprofen-Diphenhydramine Cit (IBUPROFEN PM PO) Take 2 tablets by mouth at bedtime.    Marland Kitchen losartan-hydrochlorothiazide (HYZAAR) 50-12.5 MG tablet TAKE 1 TABLET BY MOUTH  DAILY AS DIRECTED    . MAGNESIUM PO Take 3 tablets by mouth daily.    . montelukast (SINGULAIR) 10 MG tablet TAKE 1 TABLET BY MOUTH  DAILY AS  DIRECTED    . Multiple Vitamin (MULTI-VITAMINS) TABS Take by mouth.    . Omega-3 Fatty Acids (FISH OIL PO) Take 2,400 mg by mouth daily.    Marland Kitchen SIMETHICONE PO Take 180 mg by mouth as needed.    . theophylline (UNIPHYL) 400 MG 24 hr tablet TAKE 1 TABLET BY MOUTH  DAILY AS DIRECTED    . vitamin E 400 UNIT capsule Take by mouth.     No current facility-administered medications for this visit.     PAST MEDICAL  HISTORY: Past Medical History:  Diagnosis Date  . Arthritis   . Asthma   . Chronic renal impairment, stage 3 (moderate)   . Depression   . GERD (gastroesophageal reflux disease)   . Hearing loss   . Hypertension   . Neck pain   . Numbness   . Restless legs   . Sciatica     PAST SURGICAL HISTORY: Past Surgical History:  Procedure Laterality Date  . arm surgery     due to dog bite  . CATARACT EXTRACTION, BILATERAL    . OTHER SURGICAL HISTORY     bone graft - jaw   . TOOTH EXTRACTION      FAMILY HISTORY: Family History  Problem Relation Age of Onset  . Stroke Mother   . Hypertension Father   . Heart disease Father   . Heart attack Father   . Cancer Sister        unsure of type    SOCIAL HISTORY:  Social History   Social History  . Marital status: Married    Spouse name: N/A  . Number of children: 2  . Years of education: associates degree   Occupational History  . Retired    Social History Main Topics  . Smoking status: Former Smoker    Quit date: 1999  . Smokeless tobacco: Never Used  . Alcohol use Yes     Comment: 2 drinks per day  . Drug use: No  . Sexual activity: Not on file   Other Topics Concern  . Not on file   Social History Narrative   Lives at home with husband.   Right-handed.   No caffeine use.     PHYSICAL EXAM   There were no vitals filed for this visit.  Not recorded      There is no height or weight on file to calculate BMI.  PHYSICAL EXAMNIATION:  Gen: NAD, conversant, well nourised, obese, well groomed                     Cardiovascular: Regular rate rhythm, no peripheral edema, warm, nontender. Eyes: Conjunctivae clear without exudates or hemorrhage Neck: Supple, no carotid bruits. Pulmonary: Clear to auscultation bilaterally   NEUROLOGICAL EXAM:  MENTAL STATUS: Speech:    Speech is normal; fluent and spontaneous with normal comprehension.  Cognition:     Orientation to time, place and person     Normal  recent and remote memory     Normal Attention span and concentration     Normal Language, naming, repeating,spontaneous speech     Fund of knowledge   CRANIAL NERVES: CN II: Visual fields are full to confrontation. Fundoscopic exam is normal with sharp discs and no vascular changes. Pupils are round equal and briskly reactive to light. CN III, IV, VI: extraocular movement are normal. No ptosis. CN V: Facial sensation is intact to pinprick in all 3 divisions bilaterally. Corneal responses are intact.  CN VII:  Face is symmetric with normal eye closure and smile. CN VIII: Mildly decreased hearing bilaterally  CN IX, X: Palate elevates symmetrically. Phonation is normal. CN XI: Head turning and shoulder shrug are intact CN XII: Tongue is midline with normal movements and no atrophy.  MOTOR: There is no pronator drift of out-stretched arms. Muscle bulk and tone are normal. Muscle strength is normal.  REFLEXES: Reflexes are 2+ and symmetric at the biceps, triceps, knees, and absent at ankles.Plantar responses are flexor.  SENSORY: Length dependent decreased to light touch pinprick, vibratory sensation to bilateral feet,  COORDINATION: Rapid alternating movements and fine finger movements are intact. There is no dysmetria on finger-to-nose and heel-knee-shin.    GAIT/STANCE: She needs push up to get up from seated position, bending his back forward, mildly antalgic   DIAGNOSTIC DATA (LABS, IMAGING, TESTING) - I reviewed patient records, labs, notes, testing and imaging myself where available.   ASSESSMENT AND PLAN  Kerensa Nicklas is a 76 y.o. female    Bilateral lower extremity paresthesia, chronic low back pain Electrodiagnostic study today confirmed mild length dependent axonal peripheral neuropathy there was also superimposed bilateral lumbosacral radiculopathy  MRI of lumbar showed evidence of severe spinal stenosis at L4-5, moderate to severe stenosis at L 3-4  Laboratory  evaluation for etiology of peripheral neuropathy  Gabapentin 100 mg 3 times a day  Levert Feinstein, M.D. Ph.D.  [facility] [address] [place], [place] [address] Ph: [phone] Fax: [phone]  CC: Gwenlyn Found, MD

## 2017-08-31 ENCOUNTER — Telehealth: Payer: Self-pay | Admitting: *Deleted

## 2017-08-31 NOTE — Telephone Encounter (Signed)
Spoke to patient - she is aware of lab results. 

## 2017-08-31 NOTE — Telephone Encounter (Signed)
Left message for a return call

## 2017-08-31 NOTE — Telephone Encounter (Signed)
-----   Message from Levert Feinstein, MD sent at 08/31/2017  7:57 AM EDT ----- Please call patient for mild abnormal immunofixation electrophoresis of unknown clinical significance. Rest of the laboratory evaluations were normal.

## 2017-09-02 LAB — IMMUNOFIXATION ELECTROPHORESIS
IGA/IMMUNOGLOBULIN A, SERUM: 423 mg/dL — AB (ref 64–422)
IGG (IMMUNOGLOBIN G), SERUM: 631 mg/dL — AB (ref 700–1600)
IGM (IMMUNOGLOBULIN M), SRM: 47 mg/dL (ref 26–217)
Total Protein: 6.7 g/dL (ref 6.0–8.5)

## 2017-09-02 LAB — CK: Total CK: 60 U/L (ref 24–173)

## 2017-09-02 LAB — RPR: RPR Ser Ql: NONREACTIVE

## 2017-09-02 LAB — ANA W/REFLEX IF POSITIVE: ANA: NEGATIVE

## 2017-09-02 LAB — VITAMIN B12: VITAMIN B 12: 676 pg/mL (ref 232–1245)

## 2017-09-02 LAB — COPPER, SERUM: Copper: 130 ug/dL (ref 72–166)

## 2017-09-02 LAB — TSH: TSH: 3.47 u[IU]/mL (ref 0.450–4.500)

## 2017-09-02 LAB — C-REACTIVE PROTEIN: CRP: 3.9 mg/L (ref 0.0–4.9)

## 2017-09-02 LAB — VITAMIN D 25 HYDROXY (VIT D DEFICIENCY, FRACTURES): Vit D, 25-Hydroxy: 40.3 ng/mL (ref 30.0–100.0)

## 2017-09-02 LAB — FOLATE

## 2017-12-03 ENCOUNTER — Encounter: Payer: Self-pay | Admitting: Neurology

## 2017-12-03 ENCOUNTER — Ambulatory Visit: Payer: Medicare Other | Admitting: Neurology

## 2017-12-03 VITALS — BP 153/96 | HR 107 | Ht 65.0 in | Wt 207.0 lb

## 2017-12-03 DIAGNOSIS — G6289 Other specified polyneuropathies: Secondary | ICD-10-CM

## 2017-12-03 DIAGNOSIS — M5416 Radiculopathy, lumbar region: Secondary | ICD-10-CM

## 2017-12-03 NOTE — Progress Notes (Signed)
PATIENT: [name] DOB: Dec 14, 1940  HISTORICAL  [name] Edwardsvelyn Chandra is a 77 year old female, seen in refer by her primary care doctor  Brett Fairyksir, Samantha A, for evaluation of numbness in the bottom of her feet, initial evaluation was on September 6th 2018.  I reviewed and summarized referring note, she has past medical history of hypertension, asthma, depression, acid reflux, restless leg,  She reported history of chronic low back pain, around 2015 she noticed numbness of her left foot, starting at the plantar surface, gradually had involvement of her whole left foot, since January 2016, she also noticed similar involvement of right foot, she described intermittent rising sensation starting from bottom of her feet, spreading rostrally to involving her ankle, leg, spine, pelvic area, even to chest level, the rising sensation described as hot flashing, like menopause, lasting for few minutes, then gradually subsided, she denied loss of consciousness, it only happened in the sitting position, when she tries to go to bed, she tends to having her feet on the floor, or rubbing her feet with over-the-counter cream  She also complains of chronic neck pain, was diagnosed with right ulnar neuropathy when she presented with right fifth finger numbness, she had right ulnar transposition of surgery without helping her right fifth finger paresthesia  She denies significant gait abnormality and no bowel bladder incontinence  In between episodes, she denies significant pain, no bowel and bladder incontinence,  Laboratory evaluations B12 515, normal CMP, CBC, hemoglobin 14 point 3, A1C 5.3  UPDATE Aug 28 2017: she returned for electrodiagnostic study today, which showed evidence of mild axonal sensorimotor polyneuropathy, chronic bilateral lumbosacral radiculopathy  We have personally reviewed MRI of lumbar spine: Evidence of multilevel degenerative changes, 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  She has taking gabapentin 100 mg 3 times a day, which has been helpful  UPDATE Dec 03 2017: Extensive laboratory evaluation October 2018 showed normal negative vitamin D, copper, ANA, CPK, TSH, C-reactive protein, folic acid, vitamin B12, RPR, mild low IgG on protein electrophoresis,  She is taking gabapentin 100mg  tid, which has helped her lower extremity paresthesia,   REVIEW OF SYSTEMS: Full 14 system review of systems performed and notable only for as above  ALLERGIES: No Known Allergies  HOME MEDICATIONS: Current Outpatient Medications  Medication Sig Dispense Refill  . Ascorbic Acid (VITAMIN C) 1000 MG tablet Take 1,000 mg by mouth.    Marland Kitchen. aspirin EC 81 MG tablet Take by mouth.    . B Complex Vitamins (VITAMIN-B COMPLEX) TABS Take by mouth.    Marland Kitchen. buPROPion (WELLBUTRIN XL) 300 MG 24 hr tablet TAKE 1 TABLET BY MOUTH  DAILY    . CALCIUM PO Take 1,200 mg by mouth.    . Dextromethorphan-Guaifenesin (MUCINEX DM PO) Take by mouth as needed.    . diphenhydrAMINE (BENADRYL) 25 mg capsule Take 100 mg by mouth daily.    Marland Kitchen. esomeprazole (NEXIUM) 40 MG capsule TAKE 1 CAPSULE BY MOUTH  DAILY    . Fluticasone-Salmeterol (ADVAIR DISKUS) 500-50 MCG/DOSE AEPB USE 1 INHALATION TWO TIMES  DAILY    . gabapentin (NEURONTIN) 100 MG capsule Take 1 capsule (100 mg total) by mouth 3 (three) times daily. 90 capsule 11  . Glucosamine-Chondroitin (MOVE FREE PO) Take 1 tablet by mouth daily.    Marland Kitchen. ibuprofen (ADVIL,MOTRIN) 200 MG tablet Take 200 mg by mouth as needed.    . Ibuprofen-Diphenhydramine Cit (IBUPROFEN PM PO) Take 2 tablets by mouth at bedtime.    .Marland Kitchen  losartan-hydrochlorothiazide (HYZAAR) 50-12.5 MG tablet TAKE 1 TABLET BY MOUTH  DAILY AS DIRECTED    . montelukast (SINGULAIR) 10 MG tablet TAKE 1 TABLET BY MOUTH  DAILY AS DIRECTED    . Multiple Vitamin (MULTI-VITAMINS) TABS Take by mouth.    . Omega-3 Fatty Acids (FISH OIL PO) Take 2,400 mg by mouth daily.    Marland Kitchen SIMETHICONE PO  Take 180 mg by mouth as needed.    . theophylline (UNIPHYL) 400 MG 24 hr tablet TAKE 1 TABLET BY MOUTH  DAILY AS DIRECTED    . vitamin E 400 UNIT capsule Take by mouth.     No current facility-administered medications for this visit.     PAST MEDICAL HISTORY: Past Medical History:  Diagnosis Date  . Arthritis   . Asthma   . Chronic renal impairment, stage 3 (moderate) (HCC)   . Depression   . GERD (gastroesophageal reflux disease)   . Hearing loss   . Hypertension   . Neck pain   . Numbness   . Restless legs   . Sciatica     PAST SURGICAL HISTORY: Past Surgical History:  Procedure Laterality Date  . arm surgery     due to dog bite  . CATARACT EXTRACTION, BILATERAL    . OTHER SURGICAL HISTORY     bone graft - jaw   . TOOTH EXTRACTION      FAMILY HISTORY: Family History  Problem Relation Age of Onset  . Stroke Mother   . Hypertension Father   . Heart disease Father   . Heart attack Father   . Cancer Sister        unsure of type    SOCIAL HISTORY:  Social History   Socioeconomic History  . Marital status: Married    Spouse name: Not on file  . Number of children: 2  . Years of education: associates degree  . Highest education level: Not on file  Social Needs  . Financial resource strain: Not on file  . Food insecurity - worry: Not on file  . Food insecurity - inability: Not on file  . Transportation needs - medical: Not on file  . Transportation needs - non-medical: Not on file  Occupational History  . Occupation: Retired  Tobacco Use  . Smoking status: Former Smoker    Last attempt to quit: 1999    Years since quitting: 20.0  . Smokeless tobacco: Never Used  Substance and Sexual Activity  . Alcohol use: Yes    Comment: 2 drinks per day  . Drug use: No  . Sexual activity: Not on file  Other Topics Concern  . Not on file  Social History Narrative   Lives at home with husband.   Right-handed.   No caffeine use.     PHYSICAL EXAM   Vitals:    12/03/17 1203  BP: (!) 153/96  Pulse: (!) 107  Weight: 207 lb (93.9 kg)  Height: 5\' 5"  (1.651 m)    Not recorded      Body mass index is 34.45 kg/m.  PHYSICAL EXAMNIATION:  Gen: NAD, conversant, well nourised, obese, well groomed                     Cardiovascular: Regular rate rhythm, no peripheral edema, warm, nontender. Eyes: Conjunctivae clear without exudates or hemorrhage Neck: Supple, no carotid bruits. Pulmonary: Clear to auscultation bilaterally   NEUROLOGICAL EXAM:  MENTAL STATUS: Speech:    Speech is normal; fluent and spontaneous with  normal comprehension.  Cognition:     Orientation to time, place and person     Normal recent and remote memory     Normal Attention span and concentration     Normal Language, naming, repeating,spontaneous speech     Fund of knowledge   CRANIAL NERVES: CN II: Visual fields are full to confrontation. Fundoscopic exam is normal with sharp discs and no vascular changes. Pupils are round equal and briskly reactive to light. CN III, IV, VI: extraocular movement are normal. No ptosis. CN V: Facial sensation is intact to pinprick in all 3 divisions bilaterally. Corneal responses are intact.  CN VII: Face is symmetric with normal eye closure and smile. CN VIII: Mildly decreased hearing bilaterally  CN IX, X: Palate elevates symmetrically. Phonation is normal. CN XI: Head turning and shoulder shrug are intact CN XII: Tongue is midline with normal movements and no atrophy.  MOTOR: There is no pronator drift of out-stretched arms. Muscle bulk and tone are normal. Muscle strength is normal.  REFLEXES: Reflexes are 2+ and symmetric at the biceps, triceps, knees, and absent at ankles.Plantar responses are flexor.  SENSORY: Length dependent decreased to light touch pinprick, vibratory sensation to bilateral feet,  COORDINATION: Rapid alternating movements and fine finger movements are intact. There is no dysmetria on finger-to-nose  and heel-knee-shin.    GAIT/STANCE: She needs push up to get up from seated position, bending his back forward, mildly antalgic   DIAGNOSTIC DATA (LABS, IMAGING, TESTING) - I reviewed patient records, labs, notes, testing and imaging myself where available.   ASSESSMENT AND PLAN  Ofelia Podolski is a 77 y.o. 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  Levert Feinstein, M.D. Ph.D.  [facility] [address] [place], [place] [address] Ph: [phone] Fax: [phone]  CC: Gwenlyn Found, MD

## 2021-03-24 DIAGNOSIS — L97222 Non-pressure chronic ulcer of left calf with fat layer exposed: Secondary | ICD-10-CM | POA: Insufficient documentation

## 2021-03-24 DIAGNOSIS — I872 Venous insufficiency (chronic) (peripheral): Secondary | ICD-10-CM | POA: Insufficient documentation

## 2021-03-29 ENCOUNTER — Ambulatory Visit: Payer: Medicare Other | Admitting: Nurse Practitioner

## 2021-06-24 ENCOUNTER — Emergency Department (HOSPITAL_COMMUNITY): Payer: Medicare Other

## 2021-06-24 ENCOUNTER — Encounter (HOSPITAL_COMMUNITY): Payer: Self-pay | Admitting: Internal Medicine

## 2021-06-24 ENCOUNTER — Inpatient Hospital Stay (HOSPITAL_COMMUNITY)
Admission: EM | Admit: 2021-06-24 | Discharge: 2021-07-02 | DRG: 872 | Disposition: A | Discharge status: Skilled Nursing Facility | Payer: Medicare Other | Attending: Student | Admitting: Student

## 2021-06-24 DIAGNOSIS — Z809 Family history of malignant neoplasm, unspecified: Secondary | ICD-10-CM

## 2021-06-24 DIAGNOSIS — E669 Obesity, unspecified: Secondary | ICD-10-CM | POA: Diagnosis present

## 2021-06-24 DIAGNOSIS — Z66 Do not resuscitate: Secondary | ICD-10-CM | POA: Diagnosis present

## 2021-06-24 DIAGNOSIS — F329 Major depressive disorder, single episode, unspecified: Secondary | ICD-10-CM | POA: Diagnosis present

## 2021-06-24 DIAGNOSIS — K219 Gastro-esophageal reflux disease without esophagitis: Secondary | ICD-10-CM | POA: Diagnosis present

## 2021-06-24 DIAGNOSIS — F419 Anxiety disorder, unspecified: Secondary | ICD-10-CM | POA: Diagnosis present

## 2021-06-24 DIAGNOSIS — G8929 Other chronic pain: Secondary | ICD-10-CM | POA: Diagnosis present

## 2021-06-24 DIAGNOSIS — M8949 Other hypertrophic osteoarthropathy, multiple sites: Secondary | ICD-10-CM | POA: Diagnosis not present

## 2021-06-24 DIAGNOSIS — N183 Chronic kidney disease, stage 3 unspecified: Secondary | ICD-10-CM | POA: Diagnosis present

## 2021-06-24 DIAGNOSIS — W19XXXA Unspecified fall, initial encounter: Secondary | ICD-10-CM | POA: Diagnosis present

## 2021-06-24 DIAGNOSIS — R21 Rash and other nonspecific skin eruption: Secondary | ICD-10-CM | POA: Diagnosis not present

## 2021-06-24 DIAGNOSIS — L03116 Cellulitis of left lower limb: Secondary | ICD-10-CM | POA: Diagnosis present

## 2021-06-24 DIAGNOSIS — J4522 Mild intermittent asthma with status asthmaticus: Secondary | ICD-10-CM | POA: Diagnosis not present

## 2021-06-24 DIAGNOSIS — R339 Retention of urine, unspecified: Secondary | ICD-10-CM | POA: Diagnosis present

## 2021-06-24 DIAGNOSIS — Z6835 Body mass index (BMI) 35.0-35.9, adult: Secondary | ICD-10-CM

## 2021-06-24 DIAGNOSIS — N179 Acute kidney failure, unspecified: Secondary | ICD-10-CM | POA: Diagnosis present

## 2021-06-24 DIAGNOSIS — M545 Low back pain, unspecified: Secondary | ICD-10-CM | POA: Diagnosis not present

## 2021-06-24 DIAGNOSIS — G629 Polyneuropathy, unspecified: Secondary | ICD-10-CM | POA: Diagnosis present

## 2021-06-24 DIAGNOSIS — R6520 Severe sepsis without septic shock: Secondary | ICD-10-CM | POA: Diagnosis present

## 2021-06-24 DIAGNOSIS — E871 Hypo-osmolality and hyponatremia: Secondary | ICD-10-CM | POA: Diagnosis present

## 2021-06-24 DIAGNOSIS — Z8249 Family history of ischemic heart disease and other diseases of the circulatory system: Secondary | ICD-10-CM

## 2021-06-24 DIAGNOSIS — H9190 Unspecified hearing loss, unspecified ear: Secondary | ICD-10-CM | POA: Diagnosis present

## 2021-06-24 DIAGNOSIS — K760 Fatty (change of) liver, not elsewhere classified: Secondary | ICD-10-CM | POA: Diagnosis present

## 2021-06-24 DIAGNOSIS — A419 Sepsis, unspecified organism: Principal | ICD-10-CM | POA: Diagnosis present

## 2021-06-24 DIAGNOSIS — L0390 Cellulitis, unspecified: Secondary | ICD-10-CM

## 2021-06-24 DIAGNOSIS — Z20822 Contact with and (suspected) exposure to covid-19: Secondary | ICD-10-CM | POA: Diagnosis present

## 2021-06-24 DIAGNOSIS — D72825 Bandemia: Secondary | ICD-10-CM | POA: Diagnosis not present

## 2021-06-24 DIAGNOSIS — N1832 Chronic kidney disease, stage 3b: Secondary | ICD-10-CM | POA: Diagnosis present

## 2021-06-24 DIAGNOSIS — E875 Hyperkalemia: Principal | ICD-10-CM | POA: Diagnosis present

## 2021-06-24 DIAGNOSIS — E860 Dehydration: Secondary | ICD-10-CM | POA: Diagnosis present

## 2021-06-24 DIAGNOSIS — Y92009 Unspecified place in unspecified non-institutional (private) residence as the place of occurrence of the external cause: Secondary | ICD-10-CM

## 2021-06-24 DIAGNOSIS — Z888 Allergy status to other drugs, medicaments and biological substances status: Secondary | ICD-10-CM

## 2021-06-24 DIAGNOSIS — I878 Other specified disorders of veins: Secondary | ICD-10-CM | POA: Diagnosis present

## 2021-06-24 DIAGNOSIS — L03115 Cellulitis of right lower limb: Secondary | ICD-10-CM

## 2021-06-24 DIAGNOSIS — G2581 Restless legs syndrome: Secondary | ICD-10-CM | POA: Diagnosis present

## 2021-06-24 DIAGNOSIS — Z79899 Other long term (current) drug therapy: Secondary | ICD-10-CM

## 2021-06-24 DIAGNOSIS — Z823 Family history of stroke: Secondary | ICD-10-CM

## 2021-06-24 DIAGNOSIS — N189 Chronic kidney disease, unspecified: Secondary | ICD-10-CM | POA: Diagnosis present

## 2021-06-24 DIAGNOSIS — M25559 Pain in unspecified hip: Secondary | ICD-10-CM

## 2021-06-24 DIAGNOSIS — Z87891 Personal history of nicotine dependence: Secondary | ICD-10-CM

## 2021-06-24 DIAGNOSIS — T501X5A Adverse effect of loop [high-ceiling] diuretics, initial encounter: Secondary | ICD-10-CM | POA: Diagnosis present

## 2021-06-24 DIAGNOSIS — I83029 Varicose veins of left lower extremity with ulcer of unspecified site: Secondary | ICD-10-CM | POA: Diagnosis present

## 2021-06-24 DIAGNOSIS — T796XXA Traumatic ischemia of muscle, initial encounter: Secondary | ICD-10-CM | POA: Diagnosis not present

## 2021-06-24 DIAGNOSIS — J449 Chronic obstructive pulmonary disease, unspecified: Secondary | ICD-10-CM | POA: Diagnosis present

## 2021-06-24 DIAGNOSIS — R000 Tachycardia, unspecified: Secondary | ICD-10-CM | POA: Diagnosis not present

## 2021-06-24 DIAGNOSIS — R609 Edema, unspecified: Secondary | ICD-10-CM | POA: Diagnosis not present

## 2021-06-24 DIAGNOSIS — I517 Cardiomegaly: Secondary | ICD-10-CM | POA: Diagnosis present

## 2021-06-24 DIAGNOSIS — M7989 Other specified soft tissue disorders: Secondary | ICD-10-CM | POA: Diagnosis present

## 2021-06-24 DIAGNOSIS — Z7951 Long term (current) use of inhaled steroids: Secondary | ICD-10-CM

## 2021-06-24 DIAGNOSIS — I1310 Hypertensive heart and chronic kidney disease without heart failure, with stage 1 through stage 4 chronic kidney disease, or unspecified chronic kidney disease: Secondary | ICD-10-CM | POA: Diagnosis present

## 2021-06-24 DIAGNOSIS — I872 Venous insufficiency (chronic) (peripheral): Secondary | ICD-10-CM | POA: Diagnosis present

## 2021-06-24 DIAGNOSIS — R531 Weakness: Secondary | ICD-10-CM | POA: Diagnosis present

## 2021-06-24 DIAGNOSIS — I10 Essential (primary) hypertension: Secondary | ICD-10-CM | POA: Diagnosis not present

## 2021-06-24 DIAGNOSIS — R7989 Other specified abnormal findings of blood chemistry: Secondary | ICD-10-CM | POA: Diagnosis not present

## 2021-06-24 LAB — CBC WITH DIFFERENTIAL/PLATELET
Abs Immature Granulocytes: 0.23 10*3/uL — ABNORMAL HIGH (ref 0.00–0.07)
Basophils Absolute: 0.1 10*3/uL (ref 0.0–0.1)
Basophils Relative: 0 %
Eosinophils Absolute: 0 10*3/uL (ref 0.0–0.5)
Eosinophils Relative: 0 %
HCT: 44.6 % (ref 36.0–46.0)
Hemoglobin: 14.8 g/dL (ref 12.0–15.0)
Immature Granulocytes: 1 %
Lymphocytes Relative: 3 %
Lymphs Abs: 0.7 10*3/uL (ref 0.7–4.0)
MCH: 33 pg (ref 26.0–34.0)
MCHC: 33.2 g/dL (ref 30.0–36.0)
MCV: 99.3 fL (ref 80.0–100.0)
Monocytes Absolute: 1.7 10*3/uL — ABNORMAL HIGH (ref 0.1–1.0)
Monocytes Relative: 8 %
Neutro Abs: 19.3 10*3/uL — ABNORMAL HIGH (ref 1.7–7.7)
Neutrophils Relative %: 88 %
Platelets: 414 10*3/uL — ABNORMAL HIGH (ref 150–400)
RBC: 4.49 MIL/uL (ref 3.87–5.11)
RDW: 13.2 % (ref 11.5–15.5)
WBC: 22 10*3/uL — ABNORMAL HIGH (ref 4.0–10.5)
nRBC: 0 % (ref 0.0–0.2)

## 2021-06-24 LAB — COMPREHENSIVE METABOLIC PANEL
ALT: 25 U/L (ref 0–44)
ALT: 29 U/L (ref 0–44)
AST: 52 U/L — ABNORMAL HIGH (ref 15–41)
AST: 61 U/L — ABNORMAL HIGH (ref 15–41)
Albumin: 3.2 g/dL — ABNORMAL LOW (ref 3.5–5.0)
Albumin: 2.8 g/dL — ABNORMAL LOW (ref 3.5–5.0)
Alkaline Phosphatase: 103 U/L (ref 38–126)
Alkaline Phosphatase: 79 U/L (ref 38–126)
Anion gap: 18 — ABNORMAL HIGH (ref 5–15)
Anion gap: 16 — ABNORMAL HIGH (ref 5–15)
BUN: 98 mg/dL — ABNORMAL HIGH (ref 8–23)
BUN: 80 mg/dL — ABNORMAL HIGH (ref 8–23)
CO2: 22 mmol/L (ref 22–32)
CO2: 20 mmol/L — ABNORMAL LOW (ref 22–32)
Calcium: 10.1 mg/dL (ref 8.9–10.3)
Calcium: 9.2 mg/dL (ref 8.9–10.3)
Chloride: 94 mmol/L — ABNORMAL LOW (ref 98–111)
Chloride: 98 mmol/L (ref 98–111)
Creatinine, Ser: 4.66 mg/dL — ABNORMAL HIGH (ref 0.44–1.00)
Creatinine, Ser: 3.83 mg/dL — ABNORMAL HIGH (ref 0.44–1.00)
GFR, Estimated: 9 mL/min — ABNORMAL LOW (ref >60)
GFR, Estimated: 11 mL/min — ABNORMAL LOW (ref >60)
Glucose, Bld: 103 mg/dL — ABNORMAL HIGH (ref 70–99)
Glucose, Bld: 100 mg/dL — ABNORMAL HIGH (ref 70–99)
Potassium: 6.4 mmol/L (ref 3.5–5.1)
Potassium: 4.3 mmol/L (ref 3.5–5.1)
Sodium: 134 mmol/L — ABNORMAL LOW (ref 135–145)
Sodium: 134 mmol/L — ABNORMAL LOW (ref 135–145)
Total Bilirubin: 0.7 mg/dL (ref 0.3–1.2)
Total Bilirubin: 0.8 mg/dL (ref 0.3–1.2)
Total Protein: 8 g/dL (ref 6.5–8.1)
Total Protein: 6.6 g/dL (ref 6.5–8.1)

## 2021-06-24 LAB — PROTIME-INR
INR: 1 (ref 0.8–1.2)
Prothrombin Time: 13.5 seconds (ref 11.4–15.2)

## 2021-06-24 LAB — URINALYSIS, ROUTINE W REFLEX MICROSCOPIC
Bilirubin Urine: NEGATIVE
Glucose, UA: NEGATIVE mg/dL
Ketones, ur: NEGATIVE mg/dL
Nitrite: NEGATIVE
Protein, ur: 100 mg/dL — AB
Specific Gravity, Urine: 1.017 (ref 1.005–1.030)
pH: 6 (ref 5.0–8.0)

## 2021-06-24 LAB — RESP PANEL BY RT-PCR (FLU A&B, COVID) ARPGX2
Influenza A by PCR: NEGATIVE
Influenza B by PCR: NEGATIVE
SARS Coronavirus 2 by RT PCR: NEGATIVE

## 2021-06-24 LAB — CBC
HCT: 41 % (ref 36.0–46.0)
Hemoglobin: 13.1 g/dL (ref 12.0–15.0)
MCH: 32.4 pg (ref 26.0–34.0)
MCHC: 32 g/dL (ref 30.0–36.0)
MCV: 101.5 fL — ABNORMAL HIGH (ref 80.0–100.0)
Platelets: 341 10*3/uL (ref 150–400)
RBC: 4.04 MIL/uL (ref 3.87–5.11)
RDW: 13.2 % (ref 11.5–15.5)
WBC: 18 10*3/uL — ABNORMAL HIGH (ref 4.0–10.5)
nRBC: 0 % (ref 0.0–0.2)

## 2021-06-24 LAB — PHOSPHORUS: Phosphorus: 5.9 mg/dL — ABNORMAL HIGH (ref 2.5–4.6)

## 2021-06-24 LAB — LACTIC ACID, PLASMA
Lactic Acid, Venous: 1.9 mmol/L (ref 0.5–1.9)
Lactic Acid, Venous: 2.4 mmol/L (ref 0.5–1.9)

## 2021-06-24 LAB — MAGNESIUM: Magnesium: 2.6 mg/dL — ABNORMAL HIGH (ref 1.7–2.4)

## 2021-06-24 LAB — CBG MONITORING, ED: Glucose-Capillary: 119 mg/dL — ABNORMAL HIGH (ref 70–99)

## 2021-06-24 LAB — GLUCOSE, RANDOM: Glucose, Bld: 90 mg/dL (ref 70–99)

## 2021-06-24 LAB — MRSA NEXT GEN BY PCR, NASAL: MRSA by PCR Next Gen: DETECTED — AB

## 2021-06-24 LAB — TSH: TSH: 3.445 u[IU]/mL (ref 0.350–4.500)

## 2021-06-24 LAB — APTT: aPTT: 30 seconds (ref 24–36)

## 2021-06-24 MED ORDER — THEOPHYLLINE ER 400 MG PO TB24
400.0000 mg | ORAL_TABLET | Freq: Every day | ORAL | Status: DC
  Administered 2021-06-25 – 2021-07-02 (×8): 400 mg via ORAL
  Filled 2021-06-24 (×8): qty 1

## 2021-06-24 MED ORDER — SODIUM CHLORIDE 0.9 % IV SOLN: 2.0000 g | INTRAVENOUS | Status: DC

## 2021-06-24 MED ORDER — NYSTATIN 100000 UNIT/GM EX POWD
CUTANEOUS | Status: DC | PRN
  Filled 2021-06-24: qty 15

## 2021-06-24 MED ORDER — ONDANSETRON HCL 4 MG/2ML IJ SOLN: 4.0000 mg | Freq: Four times a day (QID) | INTRAMUSCULAR | Status: DC | PRN

## 2021-06-24 MED ORDER — BUPROPION HCL ER (XL) 150 MG PO TB24
150.0000 mg | ORAL_TABLET | Freq: Every day | ORAL | Status: DC
  Administered 2021-06-25 – 2021-07-02 (×8): 150 mg via ORAL
  Filled 2021-06-24 (×8): qty 1

## 2021-06-24 MED ORDER — SODIUM CHLORIDE 0.9 % IV BOLUS (SEPSIS)
1000.0000 mL | Freq: Once | INTRAVENOUS | Status: AC
  Administered 2021-06-24: 1000 mL via INTRAVENOUS

## 2021-06-24 MED ORDER — POLYETHYLENE GLYCOL 3350 17 G PO PACK: 17.0000 g | PACK | Freq: Every day | ORAL | Status: DC | PRN

## 2021-06-24 MED ORDER — CHLORHEXIDINE GLUCONATE CLOTH 2 % EX PADS
6.0000 | MEDICATED_PAD | Freq: Every day | CUTANEOUS | Status: DC
  Administered 2021-06-24 – 2021-07-02 (×9): 6 via TOPICAL

## 2021-06-24 MED ORDER — DEXTROSE 10 % IV SOLN: Freq: Once | INTRAVENOUS | Status: AC

## 2021-06-24 MED ORDER — SODIUM CHLORIDE 0.9 % IV SOLN: Freq: Once | INTRAVENOUS | Status: AC

## 2021-06-24 MED ORDER — ACETAMINOPHEN 325 MG PO TABS
650.0000 mg | ORAL_TABLET | Freq: Four times a day (QID) | ORAL | Status: DC | PRN
  Administered 2021-06-25 – 2021-06-28 (×3): 650 mg via ORAL
  Filled 2021-06-24 (×3): qty 2

## 2021-06-24 MED ORDER — HEPARIN SODIUM (PORCINE) 5000 UNIT/ML IJ SOLN
5000.0000 [IU] | Freq: Three times a day (TID) | INTRAMUSCULAR | Status: DC
  Administered 2021-06-25 – 2021-07-02 (×22): 5000 [IU] via SUBCUTANEOUS
  Filled 2021-06-24 (×22): qty 1

## 2021-06-24 MED ORDER — DEXTROSE 50 % IV SOLN
1.0000 | Freq: Once | INTRAVENOUS | Status: AC
  Administered 2021-06-24: 50 mL via INTRAVENOUS
  Filled 2021-06-24: qty 50

## 2021-06-24 MED ORDER — SODIUM CHLORIDE 0.9 % IV SOLN: INTRAVENOUS | Status: AC

## 2021-06-24 MED ORDER — VANCOMYCIN HCL IN DEXTROSE 1-5 GM/200ML-% IV SOLN
1000.0000 mg | Freq: Once | INTRAVENOUS | Status: AC
  Administered 2021-06-24: 1000 mg via INTRAVENOUS
  Filled 2021-06-24: qty 200

## 2021-06-24 MED ORDER — MOMETASONE FURO-FORMOTEROL FUM 200-5 MCG/ACT IN AERO
2.0000 | INHALATION_SPRAY | Freq: Two times a day (BID) | RESPIRATORY_TRACT | Status: DC
  Administered 2021-06-25 – 2021-07-02 (×14): 2 via RESPIRATORY_TRACT
  Filled 2021-06-24: qty 8.8

## 2021-06-24 MED ORDER — SODIUM CHLORIDE 0.9 % IV SOLN
2.0000 g | Freq: Once | INTRAVENOUS | Status: AC
  Administered 2021-06-24: 2 g via INTRAVENOUS
  Filled 2021-06-24: qty 20

## 2021-06-24 MED ORDER — VANCOMYCIN HCL 500 MG/100ML IV SOLN
500.0000 mg | INTRAVENOUS | Status: DC
Start: 2021-06-26 — End: 2021-06-26

## 2021-06-24 MED ORDER — ACETAMINOPHEN 650 MG RE SUPP: 650.0000 mg | Freq: Four times a day (QID) | RECTAL | Status: DC | PRN

## 2021-06-24 MED ORDER — HYDRALAZINE HCL 20 MG/ML IJ SOLN: 10.0000 mg | Freq: Four times a day (QID) | INTRAMUSCULAR | Status: DC | PRN

## 2021-06-24 MED ORDER — ONDANSETRON HCL 4 MG PO TABS: 4.0000 mg | ORAL_TABLET | Freq: Four times a day (QID) | ORAL | Status: DC | PRN

## 2021-06-24 MED ORDER — CHLORHEXIDINE GLUCONATE 0.12 % MT SOLN
15.0000 mL | Freq: Two times a day (BID) | OROMUCOSAL | Status: DC
  Administered 2021-06-24 – 2021-07-02 (×15): 15 mL via OROMUCOSAL
  Filled 2021-06-24 (×16): qty 15

## 2021-06-24 MED ORDER — OXYCODONE HCL 5 MG PO TABS
5.0000 mg | ORAL_TABLET | ORAL | Status: DC | PRN
  Administered 2021-06-24 – 2021-07-02 (×15): 5 mg via ORAL
  Filled 2021-06-24 (×15): qty 1

## 2021-06-24 MED ORDER — SODIUM CHLORIDE 0.9 % IV BOLUS
500.0000 mL | Freq: Once | INTRAVENOUS | Status: AC
  Administered 2021-06-24: 500 mL via INTRAVENOUS

## 2021-06-24 MED ORDER — INSULIN ASPART 100 UNIT/ML IV SOLN
5.0000 [IU] | Freq: Once | INTRAVENOUS | Status: AC
  Administered 2021-06-24: 5 [IU] via INTRAVENOUS
  Filled 2021-06-24: qty 0.05

## 2021-06-24 MED ORDER — SODIUM CHLORIDE 0.9 % IV SOLN: 2.0000 g | Freq: Once | INTRAVENOUS | Status: DC

## 2021-06-24 MED ORDER — ORAL CARE MOUTH RINSE
15.0000 mL | Freq: Two times a day (BID) | OROMUCOSAL | Status: DC
  Administered 2021-06-25 – 2021-07-01 (×13): 15 mL via OROMUCOSAL

## 2021-06-24 MED ORDER — ADULT MULTIVITAMIN W/MINERALS CH
1.0000 | ORAL_TABLET | Freq: Every day | ORAL | Status: DC
  Administered 2021-06-25 – 2021-07-02 (×8): 1 via ORAL
  Filled 2021-06-24 (×8): qty 1

## 2021-06-24 MED ORDER — SODIUM CHLORIDE 0.9 % IV SOLN
2.0000 g | INTRAVENOUS | Status: DC
  Administered 2021-06-25 – 2021-06-27 (×3): 2 g via INTRAVENOUS
  Filled 2021-06-24: qty 2
  Filled 2021-06-24 (×3): qty 20

## 2021-06-24 MED ORDER — PANTOPRAZOLE SODIUM 40 MG PO TBEC
40.0000 mg | DELAYED_RELEASE_TABLET | Freq: Every day | ORAL | Status: DC
  Administered 2021-06-25 – 2021-07-02 (×8): 40 mg via ORAL
  Filled 2021-06-24 (×8): qty 1

## 2021-06-24 MED ORDER — SODIUM CHLORIDE 0.9 % IV SOLN: INTRAVENOUS | Status: DC

## 2021-06-24 MED ORDER — SODIUM CHLORIDE 0.9 % IV BOLUS
1000.0000 mL | Freq: Once | INTRAVENOUS | Status: DC
Start: 2021-06-24 — End: 2021-06-28

## 2021-06-24 MED ORDER — SODIUM ZIRCONIUM CYCLOSILICATE 10 G PO PACK
10.0000 g | PACK | Freq: Once | ORAL | Status: AC
  Administered 2021-06-24: 10 g via ORAL
  Filled 2021-06-24: qty 1

## 2021-06-24 MED ORDER — LEVALBUTEROL HCL 0.63 MG/3ML IN NEBU: 0.6300 mg | INHALATION_SOLUTION | Freq: Four times a day (QID) | RESPIRATORY_TRACT | Status: DC | PRN

## 2021-06-24 NOTE — Progress Notes (Signed)
A consult was received from an ED physician for vancomycin per pharmacy dosing.  The patient's profile has been reviewed for ht/wt/allergies/indication/available labs.  STAT ht/wt ordered entered, no weight in chart.   A one time order has been placed for vancomycin 1000 mg IV.  Further antibiotics/pharmacy consults should be ordered by admitting physician if indicated.                       Thank you,  Cindi Carbon, PharmD 06/24/2021  3:29 PM

## 2021-06-24 NOTE — ED Provider Notes (Addendum)
[facility]-EMERGENCY DEPT Provider Note   CSN: [id number] Arrival date & time: 06/24/21  1443     History Chief Complaint  Patient presents with   Fall   Weakness    [name] is a 80 y.o. female.  Patient currently following with wound care for lower leg wounds.  Started on antibiotics 2 days ago for worsening of left lower leg wound.  Does not have any home health.  Lives with her husband.  She is feeling generally weak and having more more difficulty with walking secondary to pain in her legs.  Patient was found on the floor this morning by her husband.  She denies a fall.  She is not on blood thinner.  She states that they have not been able to arrange for home health and she has had wound care and dressing changes several times a week at her wound care clinic but does not think that is helping.  Having a hard time taking care of her self.  Denies history of diabetes.  The history is provided by the patient.  Weakness Severity:  Moderate Onset quality:  Gradual Timing:  Constant Progression:  Worsening Chronicity:  New Associated symptoms: no abdominal pain, no arthralgias, no chest pain, no cough, no dysuria, no fever, no seizures, no shortness of breath and no vomiting       Past Medical History:  Diagnosis Date   Arthritis    Asthma    Chronic renal impairment, stage 3 (moderate) (HCC)    Depression    GERD (gastroesophageal reflux disease)    Hearing loss    Hypertension    Neck pain    Numbness    Restless legs    Sciatica     Patient Active Problem List   Diagnosis Date Noted   Peripheral neuropathy 08/28/2017   Lumbar radiculopathy 08/28/2017   Paresthesia 07/30/2017   Chronic low back pain 07/30/2017    Past Surgical History:  Procedure Laterality Date   arm surgery     due to dog bite   CATARACT EXTRACTION, BILATERAL     OTHER SURGICAL HISTORY     bone graft - jaw    TOOTH EXTRACTION       OB History   No obstetric  history on file.     Family History  Problem Relation Age of Onset   Stroke Mother    Hypertension Father    Heart disease Father    Heart attack Father    Cancer Sister        unsure of type    Social History   Tobacco Use   Smoking status: Former    Types: Cigarettes    Quit date: 1999    Years since quitting: 23.5   Smokeless tobacco: Never  Vaping Use   Vaping Use: Never used  Substance Use Topics   Alcohol use: Yes    Comment: 2 drinks per day   Drug use: No    Home Medications Prior to Admission medications   Medication Sig Start Date End Date Taking? Authorizing Provider  buPROPion (WELLBUTRIN XL) 300 MG 24 hr tablet Take 300 mg by mouth daily. 06/15/17  Yes [provider]  Dextromethorphan-Guaifenesin (MUCINEX DM PO) Take 1 tablet by mouth daily as needed (cough).   Yes [provider]  diphenhydrAMINE (BENADRYL) 25 mg capsule Take 25 mg by mouth daily as needed for itching or allergies.   Yes [provider]  esomeprazole (NEXIUM) 40 MG  capsule Take 40 mg by mouth daily as needed (heartburn). 06/15/17  Yes [provider]  Fluticasone-Salmeterol (ADVAIR) 500-50 MCG/DOSE AEPB Inhale 1 puff into the lungs 2 (two) times daily. 06/15/17  Yes [provider]  gabapentin (NEURONTIN) 100 MG capsule Take 1 capsule (100 mg total) by mouth 3 (three) times daily. 07/30/17  Yes Levert Feinstein, MD  Multiple Vitamin (MULTI-VITAMINS) TABS Take 1 tablet by mouth daily.   Yes [provider]  theophylline (UNIPHYL) 400 MG 24 hr tablet Take 400 mg by mouth daily. 06/15/17  Yes [provider]  Glucosamine-Chondroitin (MOVE FREE PO) Take 1 tablet by mouth daily.    [provider]  ibuprofen (ADVIL,MOTRIN) 200 MG tablet Take 200 mg by mouth as needed.    [provider]  Ibuprofen-Diphenhydramine Cit (IBUPROFEN PM PO) Take 2 tablets by mouth at bedtime.    [provider]  losartan-hydrochlorothiazide  (HYZAAR) 50-12.5 MG tablet TAKE 1 TABLET BY MOUTH  DAILY AS DIRECTED 06/15/17   [provider]  montelukast (SINGULAIR) 10 MG tablet TAKE 1 TABLET BY MOUTH  DAILY AS DIRECTED 06/15/17   [provider]  Omega-3 Fatty Acids (FISH OIL PO) Take 2,400 mg by mouth daily.    [provider]  SIMETHICONE PO Take 180 mg by mouth as needed.    [provider]  vitamin E 400 UNIT capsule Take by mouth.    [provider]    Allergies    Meloxicam  Review of Systems   Review of Systems  Constitutional:  Negative for chills and fever.  HENT:  Negative for ear pain and sore throat.   Eyes:  Negative for pain and visual disturbance.  Respiratory:  Negative for cough and shortness of breath.   Cardiovascular:  Negative for chest pain and palpitations.  Gastrointestinal:  Negative for abdominal pain and vomiting.  Genitourinary:  Negative for dysuria and hematuria.  Musculoskeletal:  Negative for arthralgias and back pain.  Skin:  Positive for color change and wound. Negative for rash.  Neurological:  Positive for weakness. Negative for seizures and syncope.  All other systems reviewed and are negative.  Physical Exam Updated Vital Signs BP (!) 141/87 (BP Location: Right Arm)   Pulse (!) 106   Temp 99 F (37.2 C) (Oral)   Resp 18   Wt 81.6 kg   SpO2 93%   BMI 29.95 kg/m   Physical Exam Vitals and nursing note reviewed.  Constitutional:      General: She is not in acute distress.    Appearance: She is well-developed. She is not ill-appearing.  HENT:     Head: Normocephalic and atraumatic.     Nose: Nose normal.     Mouth/Throat:     Mouth: Mucous membranes are moist.  Eyes:     Extraocular Movements: Extraocular movements intact.     Conjunctiva/sclera: Conjunctivae normal.     Pupils: Pupils are equal, round, and reactive to light.  Cardiovascular:     Rate and Rhythm: Normal rate and regular rhythm.     Pulses: Normal pulses.     Heart  sounds: Normal heart sounds. No murmur heard. Pulmonary:     Effort: Pulmonary effort is normal. No respiratory distress.     Breath sounds: Normal breath sounds.  Abdominal:     Palpations: Abdomen is soft.     Tenderness: There is no abdominal tenderness.  Musculoskeletal:        General: No swelling or tenderness.  Cervical back: Neck supple.  Skin:    General: Skin is warm and dry.     Capillary Refill: Capillary refill takes less than 2 seconds.     Comments: Bilateral lower legs with redness, right leg overall does not appear to have any acute infection but left lower extremity is red and swollen with some areas of purulent drainage multiple areas of ulcers  Neurological:     General: No focal deficit present.     Mental Status: She is alert and oriented to person, place, and time.     Cranial Nerves: No cranial nerve deficit.     Sensory: No sensory deficit.    ED Results / Procedures / Treatments   Labs (all labs ordered are listed, but only abnormal results are displayed) Labs Reviewed  COMPREHENSIVE METABOLIC PANEL - Abnormal; Notable for the following components:      Result Value   Sodium 134 (*)    Potassium 6.4 (*)    Chloride 94 (*)    Glucose, Bld 103 (*)    BUN 98 (*)    Creatinine, Ser 4.66 (*)    Albumin 3.2 (*)    AST 52 (*)    GFR, Estimated 9 (*)    Anion gap 18 (*)    All other components within normal limits  CBC WITH DIFFERENTIAL/PLATELET - Abnormal; Notable for the following components:   WBC 22.0 (*)    Platelets 414 (*)    Neutro Abs 19.3 (*)    Monocytes Absolute 1.7 (*)    Abs Immature Granulocytes 0.23 (*)    All other components within normal limits  RESP PANEL BY RT-PCR (FLU A&B, COVID) ARPGX2  CULTURE, BLOOD (ROUTINE X 2)  CULTURE, BLOOD (ROUTINE X 2)  URINE CULTURE  LACTIC ACID, PLASMA  PROTIME-INR  APTT  LACTIC ACID, PLASMA  URINALYSIS, ROUTINE W REFLEX MICROSCOPIC  GLUCOSE, RANDOM    EKG EKG  Interpretation  Date/Time:  Monday June 24 2021 17:05:19 EDT Ventricular Rate:  109 PR Interval:  142 QRS Duration: 102 QT Interval:  322 QTC Calculation: 433 R Axis:   55 Text Interpretation: Sinus tachycardia Low voltage QRS Borderline ECG Confirmed by Virgina Norfolkuratolo, Graziella Connery ([id number]) on 06/24/2021 5:09:20 PM  Radiology DG Tibia/Fibula Left  Result Date: 06/24/2021 CLINICAL DATA:  Infection. Patient reports fall and weakness. Wounds to the lower extremity. EXAM: LEFT TIBIA AND FIBULA - 2 VIEW COMPARISON:  None. FINDINGS: Cortical margins of the tibia and fibula are intact. There is no evidence of fracture or other focal bone lesions. No periosteal reaction, erosion, or bone destruction. Knee and ankle alignment are maintained. Generalized soft tissue edema. No soft tissue air or radiopaque foreign body. Mild overlying artifact projects over the distal aspect of the lower leg on the lateral view. IMPRESSION: Generalized soft tissue edema. No acute osseous abnormality. Electronically Signed   By: Narda RutherfordMelanie  Sanford M.D.   On: 06/24/2021 16:49   CT Head Wo Contrast  Result Date: 06/24/2021 CLINICAL DATA:  Follow up approximately 12 hours prior. Weakness for a few days. Inability to ambulate due to weakness. EXAM: CT HEAD WITHOUT CONTRAST TECHNIQUE: Contiguous axial images were obtained from the base of the skull through the vertex without intravenous contrast. COMPARISON:  None. FINDINGS: Despite efforts by the technologist and patient, mild motion artifact is present on today's exam and could not be eliminated. This reduces exam sensitivity and specificity. Some images were repeated. Brain: There is no evidence of acute intracranial hemorrhage, mass lesion, brain  edema or extra-axial fluid collection. Mild atrophy with mild prominence of the ventricles and subarachnoid spaces. There is mild low-density in the periventricular white matter which likely reflects small vessel ischemic change. There is no CT evidence  of acute cortical infarction. Vascular: Mild intracranial atherosclerosis. No hyperdense vessel identified. Skull: Negative for fracture or focal lesion. Sinuses/Orbits: The visualized paranasal sinuses and mastoid air cells are clear. No orbital abnormalities are seen. Other: Previous bilateral lens surgery. IMPRESSION: 1. No acute intracranial or calvarial findings identified. 2. Mild atrophy and chronic small vessel ischemic changes. Electronically Signed   By: Carey Bullocks M.D.   On: 06/24/2021 16:36   DG Chest Port 1 View  Result Date: 06/24/2021 CLINICAL DATA:  80 year old female evaluation for sepsis EXAM: PORTABLE CHEST - 1 VIEW COMPARISON:  None. FINDINGS: Cardiomegaly. Hypoinflation. No focal consolidation or mass. No pleural effusion or pneumothorax. No acute osseous abnormality. IMPRESSION: No acute cardiopulmonary process. Electronically Signed   By: Roanna Banning MD   On: 06/24/2021 16:44    Procedures .Critical Care  Date/Time: 06/24/2021 5:27 PM Performed by: Virgina Norfolk, DO Authorized by: Virgina Norfolk, DO   Critical care provider statement:    Critical care time (minutes):  45   Critical care was necessary to treat or prevent imminent or life-threatening deterioration of the following conditions:  Sepsis and renal failure   Critical care was time spent personally by me on the following activities:  Development of treatment plan with patient or surrogate, blood draw for specimens, discussions with primary provider, evaluation of patient's response to treatment, examination of patient, obtaining history from patient or surrogate, ordering and performing treatments and interventions, ordering and review of laboratory studies, ordering and review of radiographic studies, pulse oximetry, re-evaluation of patient's condition and review of old charts   I assumed direction of critical care for this patient from another provider in my specialty: no     Care discussed with: admitting  provider     Medications Ordered in ED Medications  vancomycin (VANCOCIN) IVPB 1000 mg/200 mL premix (has no administration in time range)  sodium zirconium cyclosilicate (LOKELMA) packet 10 g (has no administration in time range)  insulin aspart (novoLOG) injection 5 Units (has no administration in time range)    And  dextrose 50 % solution 50 mL (has no administration in time range)  dextrose 10 % infusion (has no administration in time range)  0.9 %  sodium chloride infusion (has no administration in time range)  sodium chloride 0.9 % bolus 1,000 mL (1,000 mLs Intravenous New Bag/Given 06/24/21 1552)  cefTRIAXone (ROCEPHIN) 2 g in sodium chloride 0.9 % 100 mL IVPB (2 g Intravenous New Bag/Given 06/24/21 1620)    ED Course  I have reviewed the triage vital signs and the nursing notes.  Pertinent labs & imaging results that were available during my care of the patient were reviewed by me and considered in my medical decision making (see chart for details).    MDM Rules/Calculators/A&P                           Emunah Texidor is here for weakness, worsening left lower leg infection.  History of hypertension, neuropathy.  Follows closely with wound care.  Right lower leg appears to be fairly stable and no signs of acute infection.  She has been on doxycycline for about a day or 2 for worsening of left lower leg infection and edema.  She  goes to wound care several times a week for wound dressing changes but was started on doxycycline due to concern for some areas of purulence.  She has diffuse erythema from her left foot up to her knee.  There are multiple areas of ulcers with some purulent drainage.  She has good pulses in her lower extremities.  Her husband found her on the floor this morning.  She denies a fall but is not very good at telling me what happened.  She states that she is just too weak to get up to use her walker and that her left lower leg is more painful and making it more  difficult for her to ambulate.  Overall she has mild tachycardia but no fever.  Left lower leg wound looks acutely infected and believe she would benefit from IV antibiotics and likely needs more support at home with daily home health if not short-term rehab given her lack of mobility despite using a walker.  We will start broad-spectrum IV antibiotics and pursue sepsis work-up although does not meet sepsis criteria at this time.  Her mucous membranes are very dry and overall she appears dehydrated and in overall poor health.  We will get a CT scan of her head given possible fall.  She is not having any neck pain.  No other extremity pain.  Will admit after obtaining work-up.  Patient with leukocytosis of 22, potassium is 6.4, AKI with a creatinine of 4.66.  Lactic acid within normal limits.  CT of head unremarkable.  X-ray of left lower extremity shows no subcutaneous gas.  Overall suspect sepsis from a bad left lower leg cellulitis complicated by severe dehydration with significant hyperkalemia.  EKG does not show any hyperkalemic changes.  We will give her IV fluids, IV insulin/dextrose/Lokelma and have her admitted to medicine for further care.  This chart was dictated using voice recognition software.  Despite best efforts to proofread,  errors can occur which can change the documentation meaning.   Final Clinical Impression(s) / ED Diagnoses Final diagnoses:  Hyperkalemia  Cellulitis, unspecified cellulitis site  Sepsis, due to unspecified organism, unspecified whether acute organ dysfunction present [facility])  AKI (acute kidney injury) [facility])    Rx / DC Orders ED Discharge Orders     None        Virgina Norfolk, DO 06/24/21 1727    Virgina Norfolk, DO 06/24/21 1727

## 2021-06-24 NOTE — H&P (Signed)
History and Physical    [name]:[id number] DOB: [birth year] DOA: 06/24/2021  PCP: Gwenlyn Found, MD   Patient coming from: Home  Chief Complaint: Generalized Weakness, Inability to Ambulate and Leg Pain and Swelling   HPI: [name] is a 80 y.o. female with medical history significant of arthritis, chronic kidney disease stage IIIb, depression anxiety, GERD, history of hearing loss requiring hearing aids, history of restless leg syndrome and sciatica and numbness and neuropathy, hypertension, as well as a left leg venous stasis and wound who has been seeing the wound clinic in outpatient setting.  Patient was recently seen by wound clinic and prescribed doxycycline for cellulitis of her leg but subsequently became extremely weak and unable to ambulate.  Patient's husband states that she had a fall about 12 hours prior to admission and he is unable to get her up and ambulate because of her weakness and because of the pain in her legs.  She has had this leg cellulitis and wound since last year which has been progressively getting worse.  She has been seeing the wound clinic has been having wraps but subsequently has gotten worse and started having purulent drainage.  Patient complains of significant amount of pain when ambulating and neuropathy.  Of note she was taking her Lasix for leg swelling but had not been elevating her extremities so her legs remain swollen.  She denies chest pain or shortness of breath but did have some tremors.  She feels overall weak and she is extremely hard of hearing so most of the history is obtained from the patient and patient's husband who is at bedside.  TRH was asked admit this patient given that her leg appeared infected and she was found to be in severe sepsis with hyperkalemia and AKI on CKD stage IIIb.  ED Course: In the ED she had basic blood work done and an x-ray of her leg and a renal ultrasound.  She had chest x-ray, head CT scan as well as  an EKG.  She was given IV fluid hydration of just 1 L and had blood cultures drawn.  SARS-CoV-2 testing was negative.  Review of Systems: As per HPI otherwise all other systems reviewed and negative.   Past Medical History:  Diagnosis Date   Arthritis    Asthma    Chronic renal impairment, stage 3 (moderate) (HCC)    Depression    GERD (gastroesophageal reflux disease)    Hearing loss    Hypertension    Neck pain    Numbness    Restless legs    Sciatica     Past Surgical History:  Procedure Laterality Date   arm surgery     due to dog bite   CATARACT EXTRACTION, BILATERAL     OTHER SURGICAL HISTORY     bone graft - jaw    TOOTH EXTRACTION     SOCIAL HISTORY   reports that she quit smoking about 23 years ago. She has never used smokeless tobacco. She reports current alcohol use. She reports that she does not use drugs.  Allergies  Allergen Reactions   Meloxicam Rash   Family History  Problem Relation Age of Onset   Stroke Mother    Hypertension Father    Heart disease Father    Heart attack Father    Cancer Sister        unsure of type   Prior to Admission medications   Medication Sig Start Date End Date Taking? Authorizing  Provider  buPROPion (WELLBUTRIN XL) 150 MG 24 hr tablet Take 150 mg by mouth daily.   Yes [provider]  Dextromethorphan-Guaifenesin (MUCINEX DM PO) Take 1 tablet by mouth daily as needed (cough).   Yes [provider]  diphenhydrAMINE (BENADRYL) 25 mg capsule Take 25 mg by mouth daily as needed for itching or allergies.   Yes [provider]  doxycycline (MONODOX) 100 MG capsule Take 100 mg by mouth 2 (two) times daily. Start date : 06/22/21 06/21/21 07/01/21 Yes [provider]  Fluticasone-Salmeterol (ADVAIR) 500-50 MCG/DOSE AEPB Inhale 1 puff into the lungs 2 (two) times daily. 06/15/17  Yes [provider]  furosemide (LASIX) 40 MG tablet Take 40 mg by mouth daily.   Yes [provider]   gabapentin (NEURONTIN) 100 MG capsule Take 1 capsule (100 mg total) by mouth 3 (three) times daily. 07/30/17  Yes Levert Feinstein, MD  Glucosamine-Chondroitin (MOVE FREE PO) Take 1 tablet by mouth daily.   Yes [provider]  Hyprom-Naphaz-Polysorb-Zn Sulf (CLEAR EYES COMPLETE OP) Place 1 drop into both eyes 2 (two) times daily as needed (redness).   Yes [provider]  ibuprofen (ADVIL,MOTRIN) 200 MG tablet Take 600 mg by mouth daily as needed for moderate pain.   Yes [provider]  losartan (COZAAR) 50 MG tablet Take 25 mg by mouth daily.   Yes [provider]  Multiple Vitamin (MULTI-VITAMINS) TABS Take 1 tablet by mouth daily.   Yes [provider]  omeprazole (PRILOSEC) 20 MG capsule Take 20 mg by mouth daily as needed (heartburn).   Yes [provider]  potassium chloride SA (KLOR-CON) 20 MEQ tablet Take 20 mEq by mouth 2 (two) times daily.   Yes [provider]  SIMETHICONE PO Take 180 mg by mouth daily as needed (flatulance).   Yes [provider]  theophylline (UNIPHYL) 400 MG 24 hr tablet Take 400 mg by mouth daily. 06/15/17  Yes [provider]   Physical Exam: Vitals:   06/24/21 1512 06/24/21 1552 06/24/21 1639  BP: (!) 151/136  (!) 141/87  Pulse: (!) 106  (!) 106  Resp: 19  18  Temp: 99 F (37.2 C)    TempSrc: Oral    SpO2: 99%  93%  Weight:  81.6 kg    Constitutional: WN/WD obese Caucasian female currently in mild distress appears uncomfortable, Eyes: Lids and conjunctivae normal, sclerae anicteric  ENMT: External Ears, Nose appear normal.  She extremely hard of hearing Neck: Appears normal, supple, no cervical masses, normal ROM, no appreciable thyromegaly; no JVD Respiratory: Diminished to auscultation bilaterally, no wheezing, rales, rhonchi or crackles. Normal respiratory effort and patient is not tachypenic. No accessory muscle use.  Unlabored breathing Cardiovascular: Tachycardic rate, no  murmurs / rubs / gallops. S1 and S2 auscultated.  Is 1+ lower extremity edema bilaterally worse on the left compared to right Abdomen: Soft, non-tender, distended secondary body habitus. Bowel sounds positive.  GU: Deferred. Musculoskeletal: No clubbing / cyanosis of digits/nails. No joint deformity upper and lower extremities.  Skin: Has significant erythema and swelling in the left lower extremity compared to the right with some purulent discharge on the left and some small ulcers noted.  Has some erythema on the right leg leg.  No induration; Warm and dry.  Has some onychomycosis noted on her nails Neurologic: CN 2-12 grossly intact with no focal deficits but does have some slight tremors.  Romberg sign and cerebellar reflexes not assessed.  Psychiatric: Normal  judgment and insight. Alert and oriented x 3. Normal mood and appropriate affect.   Labs on Admission: I have personally reviewed following labs and imaging studies  CBC: Recent Labs  Lab 06/24/21 1559  WBC 22.0*  NEUTROABS 19.3*  HGB 14.8  HCT 44.6  MCV 99.3  PLT 414*   Basic Metabolic Panel: Recent Labs  Lab 06/24/21 1559 06/24/21 1800  NA 134*  --   K 6.4*  --   CL 94*  --   CO2 22  --   GLUCOSE 103* 90  BUN 98*  --   CREATININE 4.66*  --   CALCIUM 10.1  --    GFR: CrCl cannot be calculated (Unknown ideal weight.). Liver Function Tests: Recent Labs  Lab 06/24/21 1559  AST 52*  ALT 25  ALKPHOS 103  BILITOT 0.7  PROT 8.0  ALBUMIN 3.2*   No results for input(s): LIPASE, AMYLASE in the last 168 hours. No results for input(s): AMMONIA in the last 168 hours. Coagulation Profile: Recent Labs  Lab 06/24/21 1559  INR 1.0   Cardiac Enzymes: No results for input(s): CKTOTAL, CKMB, CKMBINDEX, TROPONINI in the last 168 hours. BNP (last 3 results) No results for input(s): PROBNP in the last 8760 hours. HbA1C: No results for input(s): HGBA1C in the last 72 hours. CBG: Recent Labs  Lab 06/24/21 1921   GLUCAP 119*   Lipid Profile: No results for input(s): CHOL, HDL, LDLCALC, TRIG, CHOLHDL, LDLDIRECT in the last 72 hours. Thyroid Function Tests: No results for input(s): TSH, T4TOTAL, FREET4, T3FREE, THYROIDAB in the last 72 hours. Anemia Panel: No results for input(s): VITAMINB12, FOLATE, FERRITIN, TIBC, IRON, RETICCTPCT in the last 72 hours. Urine analysis: No results found for: COLORURINE, APPEARANCEUR, LABSPEC, PHURINE, GLUCOSEU, HGBUR, BILIRUBINUR, KETONESUR, PROTEINUR, UROBILINOGEN, NITRITE, LEUKOCYTESUR Sepsis Labs: !!!!!!!!!!!!!!!!!!!!!!!!!!!!!!!!!!!!!!!!!!!! @LABRCNTIP (procalcitonin:4,lacticidven:4) ) Recent Results (from the past 240 hour(s))  Resp Panel by RT-PCR (Flu A&B, Covid) Nasopharyngeal Swab     Status: None   Collection Time: 06/24/21  3:51 PM   Specimen: Nasopharyngeal Swab; Nasopharyngeal(NP) swabs in vial transport medium  Result Value Ref Range Status   SARS Coronavirus 2 by RT PCR NEGATIVE NEGATIVE Final    Comment: (NOTE) SARS-CoV-2 target nucleic acids are NOT DETECTED.  The SARS-CoV-2 RNA is generally detectable in upper respiratory specimens during the acute phase of infection. The lowest concentration of SARS-CoV-2 viral copies this assay can detect is 138 copies/mL. A negative result does not preclude SARS-Cov-2 infection and should not be used as the sole basis for treatment or other patient management decisions. A negative result may occur with  improper specimen collection/handling, submission of specimen other than nasopharyngeal swab, presence of viral mutation(s) within the areas targeted by this assay, and inadequate number of viral copies(<138 copies/mL). A negative result must be combined with clinical observations, patient history, and epidemiological information. The expected result is Negative.  Fact Sheet for Patients:  BloggerCourse.comhttps://www.fda.gov/media/152166/download  Fact Sheet for Healthcare Providers:   SeriousBroker.ithttps://www.fda.gov/media/152162/download  This test is no t yet approved or cleared by the [place] FDA and  has been authorized for detection and/or diagnosis of SARS-CoV-2 by FDA under an Emergency Use Authorization (EUA). This EUA will remain  in effect (meaning this test can be used) for the duration of the COVID-19 declaration under Section 564(b)(1) of the Act, 21 U.S.C.section 360bbb-3(b)(1), unless the authorization is terminated  or revoked sooner.       Influenza A by PCR NEGATIVE NEGATIVE Final   Influenza B by PCR NEGATIVE  NEGATIVE Final    Comment: (NOTE) The Xpert Xpress SARS-CoV-2/FLU/RSV plus assay is intended as an aid in the diagnosis of influenza from Nasopharyngeal swab specimens and should not be used as a sole basis for treatment. Nasal washings and aspirates are unacceptable for Xpert Xpress SARS-CoV-2/FLU/RSV testing.  Fact Sheet for Patients: BloggerCourse.com  Fact Sheet for Healthcare Providers: SeriousBroker.it  This test is not yet approved or cleared by the [place] FDA and has been authorized for detection and/or diagnosis of SARS-CoV-2 by FDA under an Emergency Use Authorization (EUA). This EUA will remain in effect (meaning this test can be used) for the duration of the COVID-19 declaration under Section 564(b)(1) of the Act, 21 U.S.C. section 360bbb-3(b)(1), unless the authorization is terminated or revoked.  Performed at [facility], [address], [place], [place] [address]      Radiological Exams on Admission: DG Tibia/Fibula Left  Result Date: 06/24/2021 CLINICAL DATA:  Infection. Patient reports fall and weakness. Wounds to the lower extremity. EXAM: LEFT TIBIA AND FIBULA - 2 VIEW COMPARISON:  None. FINDINGS: Cortical margins of the tibia and fibula are intact. There is no evidence of fracture or other focal bone lesions. No periosteal reaction, erosion,  or bone destruction. Knee and ankle alignment are maintained. Generalized soft tissue edema. No soft tissue air or radiopaque foreign body. Mild overlying artifact projects over the distal aspect of the lower leg on the lateral view. IMPRESSION: Generalized soft tissue edema. No acute osseous abnormality. Electronically Signed   By: Narda Rutherford M.D.   On: 06/24/2021 16:49   CT Head Wo Contrast  Result Date: 06/24/2021 CLINICAL DATA:  Follow up approximately 12 hours prior. Weakness for a few days. Inability to ambulate due to weakness. EXAM: CT HEAD WITHOUT CONTRAST TECHNIQUE: Contiguous axial images were obtained from the base of the skull through the vertex without intravenous contrast. COMPARISON:  None. FINDINGS: Despite efforts by the technologist and patient, mild motion artifact is present on today's exam and could not be eliminated. This reduces exam sensitivity and specificity. Some images were repeated. Brain: There is no evidence of acute intracranial hemorrhage, mass lesion, brain edema or extra-axial fluid collection. Mild atrophy with mild prominence of the ventricles and subarachnoid spaces. There is mild low-density in the periventricular white matter which likely reflects small vessel ischemic change. There is no CT evidence of acute cortical infarction. Vascular: Mild intracranial atherosclerosis. No hyperdense vessel identified. Skull: Negative for fracture or focal lesion. Sinuses/Orbits: The visualized paranasal sinuses and mastoid air cells are clear. No orbital abnormalities are seen. Other: Previous bilateral lens surgery. IMPRESSION: 1. No acute intracranial or calvarial findings identified. 2. Mild atrophy and chronic small vessel ischemic changes. Electronically Signed   By: Carey Bullocks M.D.   On: 06/24/2021 16:36   US Renal  Result Date: 06/24/2021 CLINICAL DATA:  Acute kidney injury. EXAM: RENAL / URINARY TRACT ULTRASOUND COMPLETE COMPARISON:  None. FINDINGS: Right Kidney:  Renal measurements: 9.2 x 4 x 4 cm = volume: 76 mL. Echogenicity within normal limits. No mass or hydronephrosis visualized. Left Kidney: Renal measurements: 9.2 x 5.3 x 4.5 cm = volume: 114 mL. Echogenicity within normal limits. No mass or hydronephrosis visualized. Urinary bladder: Prevoid volume of 628 mL. Appears normal for degree of bladder distention. Other: Increased hepatic echogenicity. IMPRESSION: 1. Unremarkable bilateral renal ultrasound. 2. Prevoid volume urinary bladder 628 mL.  No postvoid obtained. 3. Hepatic steatosis. Electronically Signed   By: Normajean Glasgow.D.  On: 06/24/2021 19:16   DG Chest Port 1 View  Result Date: 06/24/2021 CLINICAL DATA:  80 year old female evaluation for sepsis EXAM: PORTABLE CHEST - 1 VIEW COMPARISON:  None. FINDINGS: Cardiomegaly. Hypoinflation. No focal consolidation or mass. No pleural effusion or pneumothorax. No acute osseous abnormality. IMPRESSION: No acute cardiopulmonary process. Electronically Signed   By: Roanna Banning MD   On: 06/24/2021 16:44    EKG: Independently reviewed.  Showed sinus tachycardia with a rate of 109 and QT stable 433 as well as low voltage but no evidence of ST elevation on my interpretation  Assessment/Plan Active Problems:   Severe sepsis (HCC)  Severe sepsis in setting of left lower extremity cellulitis, present on admission and failed outpatient antibiotics -Patient presented with a severe cellulitis and a acute renal failure with a creatinine over 2 as well as a WBC of 22 and a tachycardia meeting sepsis criteria; subsequently lactic acid level elevated 2.4 -Sees the wound clinic in outpatient setting and was prescribed doxycycline but swelling, erythema and purulent drainage has gotten worse -The ED only gave her 1 L of normal saline boluses and will give another 1.5 liter boluses and start maintenance IV fluid hydration at normal saline 100 MLS per hour -Cellulitis focus order set or utilized and we will start the  patient on IV ceftriaxone 2 g and IV vancomycin given her significant cellulitis -WOC Nurse Consulted -Check urinalysis and urine culture  -Blood cultures x2 -WBC was elevated 22.0 -She had a DG tib-fib which showed "Cortical margins of the tibia and fibula are intact. There is no evidence of fracture or other focal bone lesions. No periosteal reaction, erosion, or bone destruction. Knee and ankle alignment are maintained. Generalized soft tissue edema. No soft tissue air or radiopaque foreign body. Mild overlying artifact projects over the distal aspect of the lower leg on the lateral view." -DG Chest X-Ray Showed "Cardiomegaly. Hypoinflation. No focal consolidation or mass. No pleural effusion or pneumothorax. No acute osseous abnormality"  Acute kidney injury in the setting of chronic kidney disease stage IIIb Elevated anion gap  -Baseline creatinine is around 1-1.3 -Now with a BUN/creatinine of 98/4.66 in the setting of Lasix usage, losartan usage, NSAID usage with ibuprofen and Meloxicam -Start IV fluid hydration as above -Avoid nephrotoxic medications, contrast dyes, hypotension renally dose medications -Patient had an elevated anion gap with an anion gap of 18 -FENa has not been to be accurate given that she has been on Lasix' -Check bladder scan and she was retaining urine -Check renal ultrasound and showed "Unremarkable bilateral renal ultrasound. Prevoid volume urinary bladder 628 mL. No postvoid obtained. Hepatic steatosis." -Strict I's and O's -Check urinalysis and urine culture -Continue to monitor and trend renal function  Acute urinary retention -Patient was retaining urine on admission -Strict I's and O's and have placed a Foley catheter for decompression  -Will need a trial of void and will continue antibiotics -Will be checking urinalysis and urine culture  Hyperkalemia -In setting of dehydration and potassium supplementation -Patient was taking potassium supplements  along with her Lasix -Given Lokelma, D5 and insulin, and given IV fluid hydration boluses -Continue to monitor and trend and repeat CMP in AM  Generalized weakness and acute fall -In setting of dehydration from Lasix and infection -Fall approximately 12 hours prior to admission and complained of weakness for a few days and was unable to stand and ambulate due to weakness -Head CT done and showed "No acute intracranial or calvarial findings identified.  Mild atrophy and chronic small vessel ischemic changes." -Rehydration as above -Will need PT and OT to further evaluate and treat  Hyponatremia -Patient's Na+ was 134  -C/w IVF Hydration -Continue to Monitor and Trend -Repeat CMP in the AM   Elevated AST -In setting of sepsis and dehydration -Patient's AST is now 52 -Continue monitor and trend and if worsening will need to be obtaining a right upper quadrant ultrasound as well as an acute hepatitis panel -Repeat CMP in the a.m. to monitor hepatic function trend carefully  Neuropathy -Hold gabapentin for now and resume at a renally adjusted dose  Hypertension -Continue to hold furosemide 40 mg p.o. daily as well as losartan 25 mg daily next-if necessary will place on IV hydralazine next-continue to monitor blood pressures per protocol -Last blood pressure reading was  GERD -Continue with omeprazole substitution with pantoprazole 40 p.o. daily  Depression and Anxiety Continue bupropion 150 mg p.o. daily  Asthma and COPD -Currently not decompensated -Continue with theophylline and Advair substitution -Continue monitor respiratory status carefully and if necessary will place on nebs  Obesity -Complicates overall prognosis and care -Estimated body mass index is 29.95 kg/m as calculated from the following:   Height as of 12/03/17: 5\' 5"  (1.651 m).   Weight as of this encounter: 81.6 kg. -Weight Loss and Dietary Counseling given   DVT prophylaxis: Heparin 5,000 units sq  q8h Code Status: DO NOT RESUSCITATE  Family Communication: Discussed with the husband at bedside Disposition Plan: Pending further clinical improvement Consults called: None Admission status: Inpatient stepdown unit  Severity of Illness: The appropriate patient status for this patient is INPATIENT. Inpatient status is judged to be reasonable and necessary in order to provide the required intensity of service to ensure the patient's safety. The patient's presenting symptoms, physical exam findings, and initial radiographic and laboratory data in the context of their chronic comorbidities is felt to place them at high risk for further clinical deterioration. Furthermore, it is not anticipated that the patient will be medically stable for discharge from the hospital within 2 midnights of admission. The following factors support the patient status of inpatient.   " The patient's presenting symptoms include leg swelling and pain as well as purulent drainage and associated generalized weakness. " The worrisome physical exam findings include dry mucous membranes and significantly swollen and erythematous left leg. " The initial radiographic and laboratory data are worrisome because of AKI, hyperkalemia, cellulitis. " The chronic co-morbidities are listed as above   * I certify that at the point of admission it is my clinical judgment that the patient will require inpatient hospital care spanning beyond 2 midnights from the point of admission due to high intensity of service, high risk for further deterioration and high frequency of surveillance required. , D.O. [facility] PAG[facility] is on AMION  If 7PM-7AM, please contact night-coverage [email or web address]  06/24/2021, 9:00 PM

## 2021-06-24 NOTE — ED Triage Notes (Signed)
Transported by [facility] from home-- experienced a fall approximately 12 hours ago, has been c/o weakness x a few days. Unable to stand and ambulate due to weakness and wounds to BLE; patient has a wound care specialist. AAO x 4. HOH

## 2021-06-24 NOTE — Progress Notes (Signed)
Pharmacy Antibiotic Note  [name] is a 80 y.o. female admitted on 06/24/2021 with cellulitis.  Pharmacy has been consulted for vancomycin dosing for a duration of 7 days.  Antibiotics administered today in ED: Ceftriaxone 2 g IV x 1 at 1620 Vancomycin 1000 mg IV x 1 at 1754  Plan: Vancomycin 500 mg IV Q 48 hrs (Goal AUC 400-550, Expected AUC: 542.2, SCr used: 4.66) Monitor clinical picture, renal function, vancomycin levels if indicated/therapy continued for greater than 7 days Might consider changing to linezolid (no renal dose adjustment required) if serum creatinine worsens F/U C&S, abx deescalation / LOT    Weight: 81.6 kg (180 lb)  Temp (24hrs), Avg:99 F (37.2 C), Min:99 F (37.2 C), Max:99 F (37.2 C)  Recent Labs  Lab 06/24/21 1559  WBC 22.0*  CREATININE 4.66*  LATICACIDVEN 1.9    CrCl cannot be calculated (Unknown ideal weight.).    Allergies  Allergen Reactions   Meloxicam Rash    Antimicrobials this admission: 8/1 Rocephin x 1 8/1 vancomycin >> (8/7)  Microbiology results: 8/1 BCx: sent  Thank you for allowing pharmacy to be a part of this patient's care.  Payzlee Ryder P. Casimiro Needle, PharmD, BCPS Clinical Pharmacist [place] Please utilize Amion for appropriate phone number to reach the unit pharmacist [facility]) 06/24/2021 7:21 PM

## 2021-06-24 NOTE — ED Notes (Signed)
Bladder scan showed >550

## 2021-06-25 DIAGNOSIS — T796XXA Traumatic ischemia of muscle, initial encounter: Secondary | ICD-10-CM

## 2021-06-25 DIAGNOSIS — R7989 Other specified abnormal findings of blood chemistry: Secondary | ICD-10-CM

## 2021-06-25 DIAGNOSIS — D72825 Bandemia: Secondary | ICD-10-CM

## 2021-06-25 DIAGNOSIS — N179 Acute kidney failure, unspecified: Secondary | ICD-10-CM | POA: Diagnosis present

## 2021-06-25 DIAGNOSIS — N189 Chronic kidney disease, unspecified: Secondary | ICD-10-CM | POA: Diagnosis present

## 2021-06-25 DIAGNOSIS — R000 Tachycardia, unspecified: Secondary | ICD-10-CM

## 2021-06-25 DIAGNOSIS — Y92009 Unspecified place in unspecified non-institutional (private) residence as the place of occurrence of the external cause: Secondary | ICD-10-CM

## 2021-06-25 DIAGNOSIS — J4522 Mild intermittent asthma with status asthmaticus: Secondary | ICD-10-CM

## 2021-06-25 DIAGNOSIS — R7401 Elevation of levels of liver transaminase levels: Secondary | ICD-10-CM

## 2021-06-25 DIAGNOSIS — I878 Other specified disorders of veins: Secondary | ICD-10-CM

## 2021-06-25 DIAGNOSIS — L03116 Cellulitis of left lower limb: Secondary | ICD-10-CM | POA: Diagnosis present

## 2021-06-25 DIAGNOSIS — I10 Essential (primary) hypertension: Secondary | ICD-10-CM

## 2021-06-25 DIAGNOSIS — E871 Hypo-osmolality and hyponatremia: Secondary | ICD-10-CM

## 2021-06-25 DIAGNOSIS — E875 Hyperkalemia: Secondary | ICD-10-CM

## 2021-06-25 DIAGNOSIS — W19XXXA Unspecified fall, initial encounter: Secondary | ICD-10-CM

## 2021-06-25 DIAGNOSIS — R531 Weakness: Secondary | ICD-10-CM

## 2021-06-25 LAB — COMPREHENSIVE METABOLIC PANEL
ALT: 28 U/L (ref 0–44)
AST: 65 U/L — ABNORMAL HIGH (ref 15–41)
Albumin: 2.5 g/dL — ABNORMAL LOW (ref 3.5–5.0)
Alkaline Phosphatase: 74 U/L (ref 38–126)
Anion gap: 12 (ref 5–15)
BUN: 73 mg/dL — ABNORMAL HIGH (ref 8–23)
CO2: 20 mmol/L — ABNORMAL LOW (ref 22–32)
Calcium: 9.1 mg/dL (ref 8.9–10.3)
Chloride: 105 mmol/L (ref 98–111)
Creatinine, Ser: 3.19 mg/dL — ABNORMAL HIGH (ref 0.44–1.00)
GFR, Estimated: 14 mL/min — ABNORMAL LOW (ref >60)
Glucose, Bld: 90 mg/dL (ref 70–99)
Potassium: 4.2 mmol/L (ref 3.5–5.1)
Sodium: 137 mmol/L (ref 135–145)
Total Bilirubin: 0.7 mg/dL (ref 0.3–1.2)
Total Protein: 6.4 g/dL — ABNORMAL LOW (ref 6.5–8.1)

## 2021-06-25 LAB — GLUCOSE, CAPILLARY
Glucose-Capillary: 85 mg/dL (ref 70–99)
Glucose-Capillary: 73 mg/dL (ref 70–99)
Glucose-Capillary: 75 mg/dL (ref 70–99)

## 2021-06-25 LAB — CK: Total CK: 1100 U/L — ABNORMAL HIGH (ref 38–234)

## 2021-06-25 LAB — HEMOGLOBIN A1C
Hgb A1c MFr Bld: 5.9 % — ABNORMAL HIGH (ref 4.8–5.6)
Mean Plasma Glucose: 122.63 mg/dL

## 2021-06-25 LAB — CBC
HCT: 38.1 % (ref 36.0–46.0)
Hemoglobin: 12.8 g/dL (ref 12.0–15.0)
MCH: 33 pg (ref 26.0–34.0)
MCHC: 33.6 g/dL (ref 30.0–36.0)
MCV: 98.2 fL (ref 80.0–100.0)
Platelets: 303 10*3/uL (ref 150–400)
RBC: 3.88 MIL/uL (ref 3.87–5.11)
RDW: 13.2 % (ref 11.5–15.5)
WBC: 15.6 10*3/uL — ABNORMAL HIGH (ref 4.0–10.5)
nRBC: 0 % (ref 0.0–0.2)

## 2021-06-25 MED ORDER — GABAPENTIN 100 MG PO CAPS
100.0000 mg | ORAL_CAPSULE | Freq: Every day | ORAL | Status: DC
  Administered 2021-06-25: 100 mg via ORAL
  Filled 2021-06-25: qty 1

## 2021-06-25 MED ORDER — SILVER SULFADIAZINE 1 % EX CREA
TOPICAL_CREAM | Freq: Two times a day (BID) | CUTANEOUS | Status: DC
  Administered 2021-07-01 – 2021-07-02 (×2): 1 via TOPICAL
  Filled 2021-06-25 (×5): qty 50

## 2021-06-25 MED ORDER — MUPIROCIN 2 % EX OINT: 1.0000 "application " | TOPICAL_OINTMENT | Freq: Two times a day (BID) | CUTANEOUS | Status: DC

## 2021-06-25 MED ORDER — DIPHENHYDRAMINE HCL 25 MG PO CAPS
25.0000 mg | ORAL_CAPSULE | Freq: Three times a day (TID) | ORAL | Status: DC | PRN
  Administered 2021-06-25: 25 mg via ORAL
  Filled 2021-06-25: qty 1

## 2021-06-25 MED ORDER — MUPIROCIN 2 % EX OINT
1.0000 "application " | TOPICAL_OINTMENT | Freq: Two times a day (BID) | CUTANEOUS | Status: AC
  Administered 2021-06-25 – 2021-06-29 (×10): 1 via NASAL
  Filled 2021-06-25 (×3): qty 22

## 2021-06-25 MED ORDER — DEXTROSE-NACL 5-0.9 % IV SOLN: INTRAVENOUS | Status: AC

## 2021-06-25 MED ORDER — BETHANECHOL CHLORIDE 10 MG PO TABS
10.0000 mg | ORAL_TABLET | Freq: Three times a day (TID) | ORAL | Status: DC
  Administered 2021-06-25 – 2021-07-02 (×21): 10 mg via ORAL
  Filled 2021-06-25 (×25): qty 1

## 2021-06-25 MED ORDER — BETHANECHOL CHLORIDE 25 MG PO TABS
25.0000 mg | ORAL_TABLET | Freq: Once | ORAL | Status: AC
  Administered 2021-06-25: 25 mg via ORAL
  Filled 2021-06-25: qty 1

## 2021-06-25 NOTE — TOC Initial Note (Addendum)
[place] with Transition of Care [facility]) - Initial/Assessment Note    Patient Details  Name: [name] MRN: [id number] Date of Birth: 12-21-1940  Transition [facility] Care [facility]) CM/SW Contact:    Golda Acre, RN Phone Number: 06/25/2021, 7:52 AM  Clinical Narrative:                 80 y.o. female with medical history significant of arthritis, chronic kidney disease stage IIIb, depression anxiety, GERD, history of hearing loss requiring hearing aids, history of restless leg syndrome and sciatica and numbness and neuropathy, hypertension, as well as a left leg venous stasis and wound who has been seeing the wound clinic in outpatient setting.  Patient was recently seen by wound clinic and prescribed doxycycline for cellulitis of her leg but subsequently became extremely weak and unable to ambulate.  Patient's husband states that she had a fall about 12 hours prior to admission and he is unable to get her up and ambulate because of her weakness and because of the pain in her legs.  She has had this leg cellulitis and wound since last year which has been progressively getting worse.  She has been seeing the wound clinic has been having wraps but subsequently has gotten worse and started having purulent drainage.  Patient complains of significant amount of pain when ambulating and neuropathy.  Of note she was taking her Lasix for leg swelling but had not been elevating her extremities so her legs remain swollen.  She denies chest pain or shortness of breath but did have some tremors.  She feels overall weak and she is extremely hard of hearing so most of the history is obtained from the patient and patient's husband who is at bedside.  TRH was asked admit this patient given that her leg appeared infected and she was found to be in severe sepsis with hyperkalemia and AKI on CKD stage IIIb. TOC PLAN OF CARE: pt is from home =seeing the wound clinic for cellulitis of the leg x1 year, recent fall,[id number] and  creat' 3.19 Following for toc neess -possible hhc and iv abx at home. Following for progression. Jeri Modena with [employer] notified of possible home iv abx due to continued cellulits. Risk Evaluation and Mitigation Strategies (REMS)  The medication you are ordering is associated with Risk Evaluation and Mitigation Strategies (REMS) and has Elements to [employer] Use (ETASU).   Please use the link below to the FDA website to assure all ETASU are addressed for this medication.  [email or web address] Expected Discharge Plan: Home w Home Health Services Barriers to Discharge: Continued Medical Work up   Patient Goals and CMS Choice Patient states their goals for this hospitalization and ongoing recovery are:: to go home CMS Medicare.gov Compare Post Acute Care list provided to:: Patient    Expected Discharge Plan and Services Expected Discharge Plan: Home w Home Health Services   Discharge Planning Services: CM Consult   Living arrangements for the past 2 months: Single Family Home                                      Prior Living Arrangements/Services Living arrangements for the past 2 months: Single Family Home Lives with:: Spouse Patient language and need for interpreter reviewed:: Yes Do you feel safe going back to the place where you live?: Yes           Criminal Activity/Legal  Involvement Pertinent to Current Situation/Hospitalization: No - Comment as needed  Activities of Daily Living Home Assistive Devices/Equipment: Hearing aid, Walker (specify type), Grab bars around toilet (bilateral hearing aides, Perfect walker) ADL Screening (condition at time of admission) Patient's cognitive ability adequate to safely complete daily activities?: No Is the patient deaf or have difficulty hearing?: Yes (wears bilateral hearing aides) Does the patient have difficulty seeing, even when wearing glasses/contacts?: No (bilateral  cataract removal with lens implants) Does the patient have difficulty concentrating, remembering, or making decisions?: Yes Patient able to express need for assistance with ADLs?: Yes Does the patient have difficulty dressing or bathing?: Yes Independently performs ADLs?: No Communication: Independent Dressing (OT): Needs assistance Is this a change from baseline?: Change from baseline, expected to last >3 days Grooming: Needs assistance Is this a change from baseline?: Change from baseline, expected to last >3 days Feeding: Needs assistance Is this a change from baseline?: Change from baseline, expected to last >3 days Bathing: Needs assistance Is this a change from baseline?: Change from baseline, expected to last >3 days Toileting: Needs assistance Is this a change from baseline?: Change from baseline, expected to last >3days In/Out Bed: Needs assistance Is this a change from baseline?: Change from baseline, expected to last >3 days Walks in Home: Dependent Is this a change from baseline?: Change from baseline, expected to last >3 days Does the patient have difficulty walking or climbing stairs?: Yes Weakness of Legs: Both Weakness of Arms/Hands: Both  Permission Sought/Granted                  Emotional Assessment Appearance:: Appears stated age Attitude/Demeanor/Rapport: Engaged Affect (typically observed): Calm Orientation: : Oriented to Place, Oriented to Self, Oriented to  Time, Oriented to Situation Alcohol / Substance Use: Not Applicable Psych Involvement: No (comment)  Admission diagnosis:  Hyperkalemia [E87.5] AKI (acute kidney injury) (HCC) [N17.9] Severe sepsis (HCC) [A41.9, R65.20] Cellulitis, unspecified cellulitis site [L03.90] Sepsis, due to unspecified organism, unspecified whether acute organ dysfunction present [facility]) [A41.9] Patient Active Problem List   Diagnosis Date Noted   Severe sepsis (HCC) 06/24/2021   Peripheral neuropathy 08/28/2017    Lumbar radiculopathy 08/28/2017   Paresthesia 07/30/2017   Chronic low back pain 07/30/2017   PCP:  Gwenlyn Found, MD Pharmacy:   [facility] [phone] - [place], [place] - [address] [place] [address] Phone: [phone] Fax: [phone]     Social Determinants of Health (SDOH) Interventions    Readmission Risk Interventions No flowsheet data found.

## 2021-06-25 NOTE — Consult Note (Signed)
WOC Nurse Consult Note: Patient receiving care in [facility].  Primary RN present at time of my assessment. Reason for Consult: LLE wound Wound type: Per notes from Sheppard Plumber at the [facility] in [place], [place] she has a diagnosis of venous stasis of the left calf. Pressure Injury POA: Yes/No/NA Measurement:  Wound bed: there is circumferential erythema and Irritant Dermatitis from heavy drainage, and moisture from likely patient spraying of water on her wrapped legs (prior to this hospitalization).  There are numerous areas/ulcerations with yellow slough in the wound beds.  The entire LLE and foot are tender to light touch.  The patient also tells me that the ulcerations hurt worse if she tries to elevate her leg, making me think this could represent an arterial insufficiency as well. Drainage (amount, consistency, odor) heavy serous on existing wraps that were removed Periwound: as described.  Also, the left foot has heavy dried brown drainage around the toes that would be very difficult to remove due to the pain associated with touch.  The RLE is erythematous and the lateral aspect has a few scattered areas that are pink, and very lightly healed over, but also tender to touch. Dressing procedure/placement/frequency:  Place as many Mepitel as necessary to cover the red, raw area of the LLE (will likely take 8 -10), and 1-2 over the RLE lateral side of the leg. THE MEPITEL DRESSINGS CAN STAY IN PLACE UP TO 5 DAYS BEFORE THEY ARE CHANGED. Then apply a layer of silvadene cream over the Mepitel dressing. Beginning behind the toes and going to just below the knees, spiral wrap kerlix, then 4 inch ace wrap. Perform twice daily. Change the kerlix with each application of Silvadene and the Ace wraps when soiled.  I have also added an order for a Prevalon heel lift boot for each foot.  It may be in the patient's best interest to investigate arterial flow and ABI to the legs.  Although I am not  sure she could tolerate pressures to either leg in attempting to obtain ABIs.  There could be a role for inviting Vascular Services or possibly Orthopedic Services into her case for their expert advise. Thank you for the consult.  Discussed plan of care with the patient and bedside nurse.  WOC nurse will not follow at this time.  Please re-consult the WOC team if needed.  Helmut Muster, RN, MSN, CWOCN, CNS-BC, pager [phone]

## 2021-06-25 NOTE — Progress Notes (Signed)
PROGRESS NOTE  [name] WGN:[id number] DOB: August 03, 1941   PCP: Gwenlyn FoundEksir, Samantha A, MD  Patient is from: Home  DOA: 06/24/2021 LOS: 1  Chief complaints:  Chief Complaint  Patient presents with   Fall   Weakness     Brief Narrative / Interim history: 80 year old M with PMH of renal cystitis/ulcer followed at wound clinic, CKD-3B, asthma/COPD, neuropathy, HTN, osteoarthritis, anxiety, depression, RLS, diminished hearing and GERD presenting with generalized weakness, fall at home, increased leg pain, ulceration and drainage, and admitted for severe sepsis due to LLE cellulitis, AKI, hyperkalemia and acute urinary retention.  Cultures obtained.  Patient was started on IV ceftriaxone, IV vancomycin and IV fluid.  Wound care consulted.  Subjective: Seen and examined earlier this morning.  No major events overnight of this morning.  No major complaints other than itching in her legs.  She denies pain, shortness of breath, palpitation, GI or UTI symptoms.  She has indwelling Foley in place.  Objective: Vitals:   06/25/21 0759 06/25/21 0800 06/25/21 0813 06/25/21 0900  BP:  (!) 178/142 108/62 (!) 97/53  Pulse:  (!) 103 (!) 107   Resp:  14 15 14   Temp: 99.3 F (37.4 C)     TempSrc: Axillary     SpO2:  90% 92%   Weight:      Height:        Intake/Output Summary (Last 24 hours) at 06/25/2021 1107 Last data filed at 06/25/2021 1053 Gross per 24 hour  Intake 2695.11 ml  Output 1500 ml  Net 1195.11 ml   Filed Weights   06/24/21 1552 06/24/21 2148 06/25/21 0500  Weight: 81.6 kg 88.6 kg 87.8 kg    Examination:  GENERAL: No apparent distress.  Nontoxic. HEENT: MMM.  Vision grossly intact.  Diminished hearing. NECK: Supple.  No apparent JVD.  RESP: 92% on RA.  No IWOB.  Fair aeration bilaterally. CVS:  RRR. Heart sounds normal.  ABD/GI/GU: BS+. Abd soft, NTND.  Indwelling Foley. MSK/EXT:  Moves extremities.  Dressing over BLE.  SKIN: Dressing over BLE DCI. NEURO: Awake, alert  and oriented appropriately.  No apparent focal neuro deficit. PSYCH: Calm. Normal affect.   Procedures:  None  Microbiology summarized: COVID-19 and influenza PCR nonreactive. MRSA PCR screen positive.  Assessment & Plan: Severe sepsis in the setting of LLE cellulitis: POA.  Recently started on p.o. doxycycline at wound clinic. Had significant leukocytosis with tachycardia, lactic acidosis and AKI on presentation.  She also spiked fever to 100.5 overnight.  MRSA PCR screen positive.  Sepsis physiology improved.  No osseous abnormality on x-ray. -Continue Vanco and CTX -Follow cultures -Trend leukocytosis -WOCN consulted.  AKI on CKD-3B/azotemia-likely due to sepsis, diuretics, ARB, NSAID use and rhabdo.  Improving. Elevated anion gap metabolic acidosis: Likely due to azotemia.  Improving. Recent Labs    06/24/21 1559 06/24/21 2206 06/25/21 0243  BUN 98* 80* 73*  CREATININE 4.66* 3.83* 3.19*  -Renal US negative. -Avoid or minimize nephrotoxic meds -May consider changing vancomycin to Zyvox if worse -Continue IV fluid  Generalized weakness/fall at home/traumatic rhabdomyolysis: CK elevated to 1100.  CT head without acute finding.  No significant injury. -Continue IV fluid -Check CK in the morning -PT/OT eval   Acute urinary retention-reportedly had about 628 cc on bladder scan prevoid and Foley catheter was inserted.  Renal US without acute finding. -Start bethanechol -Voiding trial   Hyperkalemia: Likely due to renal failure.  Resolved. -Continue IV fluids  Hyponatremia: In the setting of AKI.  Resolved. -  Continue IV fluid   Elevated AST: Likely due to rhabdo.  Stable. -Treat rhabdo as above   Neuropathic pain -Resume home gabapentin at reduced dose.   Hypertension: Normotensive for most part.  Some of the readings are not accurate. -Continue IV fluid -Continue holding Lasix and losartan  Sinus tachycardia: Could be in the setting of sepsis.  However, she is  also on theophylline which could contribute. -Continue IV fluid as above -Xopenex as needed instead of albuterol   GERD -Continue Protonix  Depression and Anxiety: Stable -Continue bupropion 150 mg p.o. daily  Asthma and COPD: Stable. -Continue with theophylline and Advair substitution -Continue monitor respiratory status carefully and if necessary will place on nebs   Obesity Body mass index is 33.23 kg/m.         DVT prophylaxis:  heparin injection 5,000 Units Start: 06/25/21 0600  Code Status: DNR/DNI Family Communication: Patient and/or RN. Available if any question.  Level of care: Stepdown.  Will transfer to progressive care Status is: Inpatient  Remains inpatient appropriate because:Hemodynamically unstable, IV treatments appropriate due to intensity of illness or inability to take PO, and Inpatient level of care appropriate due to severity of illness  Dispo: The patient is from: Home              Anticipated d/c is to:  To be determined              Patient currently is not medically stable to d/c.   Difficult to place patient No       Consultants:  None   Sch Meds:  Scheduled Meds:  bethanechol  10 mg Oral TID   buPROPion  150 mg Oral Daily   chlorhexidine  15 mL Mouth Rinse BID   Chlorhexidine Gluconate Cloth  6 each Topical Daily   heparin  5,000 Units Subcutaneous Q8H   mouth rinse  15 mL Mouth Rinse q12n4p   mometasone-formoterol  2 puff Inhalation BID   multivitamin with minerals  1 tablet Oral Daily   mupirocin ointment  1 application Nasal BID   pantoprazole  40 mg Oral Daily   theophylline  400 mg Oral Daily   Continuous Infusions:  sodium chloride 100 mL/hr at 06/25/21 0641   sodium chloride     cefTRIAXone (ROCEPHIN)  IV     sodium chloride     [START ON 06/26/2021] vancomycin     PRN Meds:.acetaminophen **OR** acetaminophen, diphenhydrAMINE, hydrALAZINE, levalbuterol, nystatin, ondansetron **OR** ondansetron (ZOFRAN) IV, oxyCODONE,  polyethylene glycol  Antimicrobials: Anti-infectives (From admission, onward)    Start     Dose/Rate Route Frequency Ordered Stop   06/26/21 1800  vancomycin (VANCOREADY) IVPB 500 mg/100 mL        500 mg 100 mL/hr over 60 Minutes Intravenous Every 48 hours 06/24/21 1917 07/02/21 1759   06/25/21 1600  cefTRIAXone (ROCEPHIN) 2 g in sodium chloride 0.9 % 100 mL IVPB        2 g 200 mL/hr over 30 Minutes Intravenous Every 24 hours 06/24/21 2059     06/24/21 2245  cefTRIAXone (ROCEPHIN) 2 g in sodium chloride 0.9 % 100 mL IVPB  Status:  Discontinued        2 g 200 mL/hr over 30 Minutes Intravenous  Once 06/24/21 2145 06/24/21 2149   06/24/21 2245  cefTRIAXone (ROCEPHIN) 2 g in sodium chloride 0.9 % 100 mL IVPB  Status:  Discontinued        2 g 200 mL/hr over 30 Minutes Intravenous  Every 24 hours 06/24/21 2145 06/24/21 2149   06/24/21 2100  cefTRIAXone (ROCEPHIN) 2 g in sodium chloride 0.9 % 100 mL IVPB  Status:  Discontinued        2 g 200 mL/hr over 30 Minutes Intravenous Every 24 hours 06/24/21 2059 06/24/21 2100   06/24/21 1530  vancomycin (VANCOCIN) IVPB 1000 mg/200 mL premix        1,000 mg 200 mL/hr over 60 Minutes Intravenous  Once 06/24/21 1520 06/24/21 1854   06/24/21 1530  cefTRIAXone (ROCEPHIN) 2 g in sodium chloride 0.9 % 100 mL IVPB        2 g 200 mL/hr over 30 Minutes Intravenous  Once 06/24/21 1520 06/24/21 1650        I have personally reviewed the following labs and images: CBC: Recent Labs  Lab 06/24/21 1559 06/24/21 2206 06/25/21 0243  WBC 22.0* 18.0* 15.6*  NEUTROABS 19.3*  --   --   HGB 14.8 13.1 12.8  HCT 44.6 41.0 38.1  MCV 99.3 101.5* 98.2  PLT 414* 341 303   BMP &GFR Recent Labs  Lab 06/24/21 1559 06/24/21 1800 06/24/21 2206 06/25/21 0243  NA 134*  --  134* 137  K 6.4*  --  4.3 4.2  CL 94*  --  98 105  CO2 22  --  20* 20*  GLUCOSE 103* 90 100* 90  BUN 98*  --  80* 73*  CREATININE 4.66*  --  3.83* 3.19*  CALCIUM 10.1  --  9.2 9.1  MG  --    --  2.6*  --   PHOS  --   --  5.9*  --    Estimated Creatinine Clearance: 15.1 mL/min (A) (by C-G formula based on SCr of 3.19 mg/dL (H)). Liver & Pancreas: Recent Labs  Lab 06/24/21 1559 06/24/21 2206 06/25/21 0243  AST 52* 61* 65*  ALT ALKPHOS 103 79 74  BILITOT 0.7 0.8 0.7  PROT 8.0 6.6 6.4*  ALBUMIN 3.2* 2.8* 2.5*   No results for input(s): LIPASE, AMYLASE in the last 168 hours. No results for input(s): AMMONIA in the last 168 hours. Diabetic: Recent Labs    06/24/21 2206  HGBA1C 5.9*   Recent Labs  Lab 06/24/21 1921 06/25/21 0745  GLUCAP 119* 85   Cardiac Enzymes: Recent Labs  Lab 06/25/21 0728  CKTOTAL 1,100*   No results for input(s): PROBNP in the last 8760 hours. Coagulation Profile: Recent Labs  Lab 06/24/21 1559  INR 1.0   Thyroid Function Tests: Recent Labs    06/24/21 2206  TSH 3.445   Lipid Profile: No results for input(s): CHOL, HDL, LDLCALC, TRIG, CHOLHDL, LDLDIRECT in the last 72 hours. Anemia Panel: No results for input(s): VITAMINB12, FOLATE, FERRITIN, TIBC, IRON, RETICCTPCT in the last 72 hours. Urine analysis:    Component Value Date/Time   COLORURINE YELLOW 06/24/2021 2100   APPEARANCEUR HAZY (A) 06/24/2021 2100   LABSPEC 1.017 06/24/2021 2100   PHURINE 6.0 06/24/2021 2100   GLUCOSEU NEGATIVE 06/24/2021 2100   HGBUR MODERATE (A) 06/24/2021 2100   BILIRUBINUR NEGATIVE 06/24/2021 2100   KETONESUR NEGATIVE 06/24/2021 2100   PROTEINUR 100 (A) 06/24/2021 2100   NITRITE NEGATIVE 06/24/2021 2100   LEUKOCYTESUR MODERATE (A) 06/24/2021 2100   Sepsis Labs: Invalid input(s): PROCALCITONIN, LACTICIDVEN  Microbiology: Recent Results (from the past 240 hour(s))  Blood Culture (routine x 2)     Status: None (Preliminary result)   Collection Time: 06/24/21  3:48 PM  Specimen: BLOOD  Result Value Ref Range Status   Specimen Description   Final    BLOOD LEFT ANTECUBITAL Performed at [facility], [address] [place], [place] [address]    Special Requests   Final    BOTTLES DRAWN AEROBIC AND ANAEROBIC Blood Culture adequate volume Performed at [facility], [address], [place], [place] [address]    Culture   Final    NO GROWTH < 24 HOURS Performed at [facility], [address] [place], [place] [address]    Report Status PENDING  Incomplete  Blood Culture (routine x 2)     Status: None (Preliminary result)   Collection Time: 06/24/21  3:48 PM   Specimen: BLOOD  Result Value Ref Range Status   Specimen Description   Final    BLOOD RIGHT ANTECUBITAL Performed at [facility], [address] [place], [place] [address]    Special Requests   Final    BOTTLES DRAWN AEROBIC AND ANAEROBIC Blood Culture adequate volume Performed at [facility], [address] [place], [place] [address]    Culture   Final    NO GROWTH < 24 HOURS Performed at [facility], [address] [place], [place] [address]    Report Status PENDING  Incomplete  Resp Panel by RT-PCR (Flu A&B, Covid) Nasopharyngeal Swab     Status: None   Collection Time: 06/24/21  3:51 PM   Specimen: Nasopharyngeal Swab; Nasopharyngeal(NP) swabs in vial transport medium  Result Value Ref Range Status   SARS Coronavirus 2 by RT PCR NEGATIVE NEGATIVE Final    Comment: (NOTE) SARS-CoV-2 target nucleic acids are NOT DETECTED.  The SARS-CoV-2 RNA is generally detectable in upper respiratory specimens during the acute phase of infection. The lowest concentration of SARS-CoV-2 viral copies this assay can detect is 138 copies/mL. A negative result does not preclude SARS-Cov-2 infection and should not be used as the sole basis for treatment or other patient management decisions. A negative result may occur with  improper specimen collection/handling, submission of specimen other than nasopharyngeal swab, presence of viral mutation(s) within the areas  targeted by this assay, and inadequate number of viral copies(<138 copies/mL). A negative result must be combined with clinical observations, patient history, and epidemiological information. The expected result is Negative.  Fact Sheet for Patients:  BloggerCourse.com  Fact Sheet for Healthcare Providers:  SeriousBroker.it  This test is no t yet approved or cleared by the [place] FDA and  has been authorized for detection and/or diagnosis of SARS-CoV-2 by FDA under an Emergency Use Authorization (EUA). This EUA will remain  in effect (meaning this test can be used) for the duration of the COVID-19 declaration under Section 564(b)(1) of the Act, 21 U.S.C.section 360bbb-3(b)(1), unless the authorization is terminated  or revoked sooner.       Influenza A by PCR NEGATIVE NEGATIVE Final   Influenza B by PCR NEGATIVE NEGATIVE Final    Comment: (NOTE) The Xpert Xpress SARS-CoV-2/FLU/RSV plus assay is intended as an aid in the diagnosis of influenza from Nasopharyngeal swab specimens and should not be used as a sole basis for treatment. Nasal washings and aspirates are unacceptable for Xpert Xpress SARS-CoV-2/FLU/RSV testing.  Fact Sheet for Patients: BloggerCourse.com  Fact Sheet for Healthcare Providers: SeriousBroker.it  This test is not yet approved or cleared by the [place] FDA and has been authorized for detection and/or diagnosis of SARS-CoV-2 by FDA  under an Emergency Use Authorization (EUA). This EUA will remain in effect (meaning this test can be used) for the duration of the COVID-19 declaration under Section 564(b)(1) of the Act, 21 U.S.C. section 360bbb-3(b)(1), unless the authorization is terminated or revoked.  Performed at [facility], [address], [place], [place] [address]   MRSA Next Gen by PCR, Nasal     Status: Abnormal    Collection Time: 06/24/21  9:47 PM   Specimen: Nasal Mucosa; Nasal Swab  Result Value Ref Range Status   MRSA by PCR Next Gen DETECTED (A) NOT DETECTED Final    Comment: RESULT CALLED TO, READ BACK BY AND VERIFIED WITH: RN S MORDUE AT 2351 06/24/21 CRUICKSHANK A (NOTE) The GeneXpert MRSA Assay (FDA approved for NASAL specimens only), is one component of a comprehensive MRSA colonization surveillance program. It is not intended to diagnose MRSA infection nor to guide or monitor treatment for MRSA infections. Test performance is not FDA approved in patients less than 32 years old. Performed at [facility], [address], [place], [place] [address]     Radiology Studies: DG Tibia/Fibula Left  Result Date: 06/24/2021 CLINICAL DATA:  Infection. Patient reports fall and weakness. Wounds to the lower extremity. EXAM: LEFT TIBIA AND FIBULA - 2 VIEW COMPARISON:  None. FINDINGS: Cortical margins of the tibia and fibula are intact. There is no evidence of fracture or other focal bone lesions. No periosteal reaction, erosion, or bone destruction. Knee and ankle alignment are maintained. Generalized soft tissue edema. No soft tissue air or radiopaque foreign body. Mild overlying artifact projects over the distal aspect of the lower leg on the lateral view. IMPRESSION: Generalized soft tissue edema. No acute osseous abnormality. Electronically Signed   By: Narda Rutherford M.D.   On: 06/24/2021 16:49   CT Head Wo Contrast  Result Date: 06/24/2021 CLINICAL DATA:  Follow up approximately 12 hours prior. Weakness for a few days. Inability to ambulate due to weakness. EXAM: CT HEAD WITHOUT CONTRAST TECHNIQUE: Contiguous axial images were obtained from the base of the skull through the vertex without intravenous contrast. COMPARISON:  None. FINDINGS: Despite efforts by the technologist and patient, mild motion artifact is present on today's exam and could not be eliminated. This reduces exam  sensitivity and specificity. Some images were repeated. Brain: There is no evidence of acute intracranial hemorrhage, mass lesion, brain edema or extra-axial fluid collection. Mild atrophy with mild prominence of the ventricles and subarachnoid spaces. There is mild low-density in the periventricular white matter which likely reflects small vessel ischemic change. There is no CT evidence of acute cortical infarction. Vascular: Mild intracranial atherosclerosis. No hyperdense vessel identified. Skull: Negative for fracture or focal lesion. Sinuses/Orbits: The visualized paranasal sinuses and mastoid air cells are clear. No orbital abnormalities are seen. Other: Previous bilateral lens surgery. IMPRESSION: 1. No acute intracranial or calvarial findings identified. 2. Mild atrophy and chronic small vessel ischemic changes. Electronically Signed   By: Carey Bullocks M.D.   On: 06/24/2021 16:36   US Renal  Result Date: 06/24/2021 CLINICAL DATA:  Acute kidney injury. EXAM: RENAL / URINARY TRACT ULTRASOUND COMPLETE COMPARISON:  None. FINDINGS: Right Kidney: Renal measurements: 9.2 x 4 x 4 cm = volume: 76 mL. Echogenicity within normal limits. No mass or hydronephrosis visualized. Left Kidney: Renal measurements: 9.2 x 5.3 x 4.5 cm = volume: 114 mL. Echogenicity within normal limits. No mass or hydronephrosis visualized. Urinary bladder: Prevoid volume of 628 mL. Appears  normal for degree of bladder distention. Other: Increased hepatic echogenicity. IMPRESSION: 1. Unremarkable bilateral renal ultrasound. 2. Prevoid volume urinary bladder 628 mL.  No postvoid obtained. 3. Hepatic steatosis. Electronically Signed   By: Tish Frederickson M.D.   On: 06/24/2021 19:16   DG Chest Port 1 View  Result Date: 06/24/2021 CLINICAL DATA:  80 year old female evaluation for sepsis EXAM: PORTABLE CHEST - 1 VIEW COMPARISON:  None. FINDINGS: Cardiomegaly. Hypoinflation. No focal consolidation or mass. No pleural effusion or pneumothorax.  No acute osseous abnormality. IMPRESSION: No acute cardiopulmonary process. Electronically Signed   By: Roanna Banning MD   On: 06/24/2021 16:44       Standley Bargo T. Porche Steinberger [facility]  If 7PM-7AM, please contact night-coverage [email or web address] 06/25/2021, 11:07 AM

## 2021-06-26 DIAGNOSIS — E876 Hypokalemia: Secondary | ICD-10-CM

## 2021-06-26 LAB — GLUCOSE, CAPILLARY
Glucose-Capillary: 123 mg/dL — ABNORMAL HIGH (ref 70–99)
Glucose-Capillary: 149 mg/dL — ABNORMAL HIGH (ref 70–99)
Glucose-Capillary: 152 mg/dL — ABNORMAL HIGH (ref 70–99)
Glucose-Capillary: 80 mg/dL (ref 70–99)
Glucose-Capillary: 81 mg/dL (ref 70–99)
Glucose-Capillary: 90 mg/dL (ref 70–99)

## 2021-06-26 LAB — COMPREHENSIVE METABOLIC PANEL
ALT: 33 U/L (ref 0–44)
AST: 60 U/L — ABNORMAL HIGH (ref 15–41)
Albumin: 2.4 g/dL — ABNORMAL LOW (ref 3.5–5.0)
Alkaline Phosphatase: 75 U/L (ref 38–126)
Anion gap: 11 (ref 5–15)
BUN: 47 mg/dL — ABNORMAL HIGH (ref 8–23)
CO2: 17 mmol/L — ABNORMAL LOW (ref 22–32)
Calcium: 8.8 mg/dL — ABNORMAL LOW (ref 8.9–10.3)
Chloride: 110 mmol/L (ref 98–111)
Creatinine, Ser: 1.73 mg/dL — ABNORMAL HIGH (ref 0.44–1.00)
GFR, Estimated: 30 mL/min — ABNORMAL LOW (ref >60)
Glucose, Bld: 82 mg/dL (ref 70–99)
Potassium: 3.2 mmol/L — ABNORMAL LOW (ref 3.5–5.1)
Sodium: 138 mmol/L (ref 135–145)
Total Bilirubin: 0.8 mg/dL (ref 0.3–1.2)
Total Protein: 5.9 g/dL — ABNORMAL LOW (ref 6.5–8.1)

## 2021-06-26 LAB — PHOSPHORUS: Phosphorus: 3.6 mg/dL (ref 2.5–4.6)

## 2021-06-26 LAB — TSH: TSH: 3.983 u[IU]/mL (ref 0.350–4.500)

## 2021-06-26 LAB — CBC WITH DIFFERENTIAL/PLATELET
Abs Immature Granulocytes: 0.18 10*3/uL — ABNORMAL HIGH (ref 0.00–0.07)
Basophils Absolute: 0.1 10*3/uL (ref 0.0–0.1)
Basophils Relative: 1 %
Eosinophils Absolute: 0.1 10*3/uL (ref 0.0–0.5)
Eosinophils Relative: 1 %
HCT: 38.1 % (ref 36.0–46.0)
Hemoglobin: 12.1 g/dL (ref 12.0–15.0)
Immature Granulocytes: 2 %
Lymphocytes Relative: 12 %
Lymphs Abs: 1.2 10*3/uL (ref 0.7–4.0)
MCH: 32.4 pg (ref 26.0–34.0)
MCHC: 31.8 g/dL (ref 30.0–36.0)
MCV: 102.1 fL — ABNORMAL HIGH (ref 80.0–100.0)
Monocytes Absolute: 1.3 10*3/uL — ABNORMAL HIGH (ref 0.1–1.0)
Monocytes Relative: 13 %
Neutro Abs: 7.4 10*3/uL (ref 1.7–7.7)
Neutrophils Relative %: 71 %
Platelets: 297 10*3/uL (ref 150–400)
RBC: 3.73 MIL/uL — ABNORMAL LOW (ref 3.87–5.11)
RDW: 13.2 % (ref 11.5–15.5)
WBC: 10.3 10*3/uL (ref 4.0–10.5)
nRBC: 0 % (ref 0.0–0.2)

## 2021-06-26 LAB — RAPID URINE DRUG SCREEN, HOSP PERFORMED
Amphetamines: NOT DETECTED
Barbiturates: NOT DETECTED
Benzodiazepines: NOT DETECTED
Cocaine: NOT DETECTED
Opiates: POSITIVE — AB
Tetrahydrocannabinol: NOT DETECTED

## 2021-06-26 LAB — MAGNESIUM: Magnesium: 2.2 mg/dL (ref 1.7–2.4)

## 2021-06-26 LAB — VITAMIN B12: Vitamin B-12: 1180 pg/mL — ABNORMAL HIGH (ref 180–914)

## 2021-06-26 LAB — CK: Total CK: 643 U/L — ABNORMAL HIGH (ref 38–234)

## 2021-06-26 LAB — URINE CULTURE: Culture: >=100000 — AB

## 2021-06-26 LAB — AMMONIA: Ammonia: 17 umol/L (ref 9–35)

## 2021-06-26 MED ORDER — DEXTROSE-NACL 5-0.9 % IV SOLN: INTRAVENOUS | Status: AC

## 2021-06-26 MED ORDER — FLUMAZENIL 0.5 MG/5ML IV SOLN
INTRAVENOUS | Status: AC
  Filled 2021-06-26: qty 5

## 2021-06-26 MED ORDER — VANCOMYCIN HCL IN DEXTROSE 1-5 GM/200ML-% IV SOLN
1000.0000 mg | INTRAVENOUS | Status: DC
  Administered 2021-06-26: 1000 mg via INTRAVENOUS
  Filled 2021-06-26: qty 200

## 2021-06-26 MED ORDER — PROSOURCE PLUS PO LIQD
30.0000 mL | Freq: Two times a day (BID) | ORAL | Status: DC
  Administered 2021-06-26 – 2021-07-02 (×12): 30 mL via ORAL
  Filled 2021-06-26 (×12): qty 30

## 2021-06-26 MED ORDER — ENSURE ENLIVE PO LIQD
237.0000 mL | ORAL | Status: DC
  Administered 2021-06-26 – 2021-07-01 (×6): 237 mL via ORAL

## 2021-06-26 MED ORDER — GABAPENTIN 100 MG PO CAPS
200.0000 mg | ORAL_CAPSULE | Freq: Two times a day (BID) | ORAL | Status: DC
  Administered 2021-06-26 – 2021-06-27 (×2): 200 mg via ORAL
  Filled 2021-06-26 (×2): qty 2

## 2021-06-26 MED ORDER — POTASSIUM CHLORIDE CRYS ER 20 MEQ PO TBCR
40.0000 meq | EXTENDED_RELEASE_TABLET | ORAL | Status: AC
  Administered 2021-06-26 (×2): 40 meq via ORAL
  Filled 2021-06-26 (×2): qty 2

## 2021-06-26 NOTE — Progress Notes (Signed)
Pharmacy Antibiotic Note  [name] is a 80 y.o. female admitted on 06/24/2021 with cellulitis.  Patient was recently seen at her wound care clinic and given a prescription for doxycycline, however the wound has progressively been getting worse and patient unable to ambulate. Pharmacy has been consulted for vancomycin dosing for a duration of 7 days.   WBC 15 >>10.3, patient is afebrile  Plan: Adjust vancomycin to 1000 mg IV Q 48 hrs starting 8/3 PM (Goal AUC 400-550, Expected AUC: 484, SCr used: 1.73, Vd 0.5 L/kg) Ceftriaxone 2g IV q24 hours per MD Monitor clinical picture, renal function, vancomycin levels if indicated F/U C&S, abx deescalation / LOT  Height: 5\' 4"  (162.6 cm) Weight: 89.7 kg (197 lb 12 oz) IBW/kg (Calculated) : 54.7  Temp (24hrs), Avg:98.7 F (37.1 C), Min:97.7 F (36.5 C), Max:99.3 F (37.4 C)  Recent Labs  Lab 06/24/21 1559 06/24/21 1719 06/24/21 2206 06/25/21 0243 06/26/21 0242  WBC 22.0*  --  18.0* 15.6* 10.3  CREATININE 4.66*  --  3.83* 3.19* 1.73*  LATICACIDVEN 1.9 2.4*  --   --   --      Estimated Creatinine Clearance: 28.1 mL/min (A) (by C-G formula based on SCr of 1.73 mg/dL (H)).    Allergies  Allergen Reactions   Meloxicam Rash    Antimicrobials this admission: Ceftriaxone 8/1 >> Vancomycin 8/1 >> (plan 7 days per consult)  Microbiology results: 8/1 BCx: ngtd 8/1 Ucx: mult species present 8/2 L toe culture: few gram pos cocci on gram stain 8/2 L leg culture: ngtd 8/1 MRSA swab: pos  Thank you for allowing pharmacy to be a part of this patient's care.  10/1, PharmD 06/26/2021 7:40 AM

## 2021-06-26 NOTE — Progress Notes (Signed)
Initial Nutrition Assessment  DOCUMENTATION CODES:   Obesity unspecified  INTERVENTION:  - will order Ensure Enlive once/day, each supplement provides 350 kcal and 20 grams of protein. - will order 30 ml Prosource Plus BID, each supplement provides 100 kcal and 15 grams protein.  - complete NFPE when feasible.    NUTRITION DIAGNOSIS:   Increased nutrient needs related to acute illness, wound healing as evidenced by estimated needs.  GOAL:   Patient will meet greater than or equal to 90% of their needs  MONITOR:   PO intake, Supplement acceptance, Labs, Weight trends, Skin  REASON FOR ASSESSMENT:   Malnutrition Screening Tool  ASSESSMENT:   80 year old female with medical history of renal cystitis/ulcer followed at wound clinic, stage 3 CKD, asthma/COPD, neuropathy, HTN, osteoarthritis, anxiety, depression, RLS, diminished hearing, and GERD. She presented to the ED with generalized weakness, s/p fall at home, and increased leg pain with ulceration and drainage. She was admitted with severe sepsis d/t LLE cellulitis, AKI, hyperkalemia, and acute urinary retention.  Diet advanced from NPO to Heart Healthy at 1200. RN has ordered patient a house tray. Patient laying in bed with no family or visitors present.   Patient noted to be a/o x4, but does easily get off topic and mainly does not answer the questions asked by RD. She mainly focuses on her LLE, specifically toes, pain and her desire for pain medication for this.   Able to talk with RN before and after visit to patient's room.  She has not been seen by a [place] RD at any time in the past. Note in MST screen indicates patient had reported poor appetite x1 week PTA and that she was unsure if she had lost any weight PTA.  Weight today is 198 lb and weight on 8/1 was 195 lb. Prior to this, the most recently documented weight was on 12/03/17 when she weighed 206 lb. Non-pitting edema to BLE documented in the edema section of  flow sheet.   Per notes: - severe sepsis d/t LLE cellulitis - AKI on stage 3 CKD - azotemia--improving - metabolic acidosis - generalized weakness, fall at home, traumatic rhabdomyolysis - acute urinary retention--failed voiding trial 8/2 - neuropathic pain   Labs reviewed; CBGs: 80, 81, 90 mg/dl, K: 3.2 mmol/l, BUN: 47 mg/dl, creatinine: 1.61 mg/dl, GFR: 30 ml/min. Medications reviewed; 40 mg oral protonix/day, 40 mEq Klor-Con x2 doses 8/3. IVF; D5-NS @ 100 ml/hr (408 kcal/24 hrs).     NUTRITION - FOCUSED PHYSICAL EXAM:  Unable to complete at this time.   Diet Order:   Diet Order             Diet Heart Room service appropriate? Yes; Fluid consistency: Thin  Diet effective now                   EDUCATION NEEDS:   Not appropriate for education at this time  Skin:  Skin Assessment: Skin Integrity Issues: Skin Integrity Issues:: Other (Comment) Other: Non-pressure injuries to entire calf and ankle on RLE and LLE; MASD to bilateral groin; MASD to bilateral mid-abdomen  Last BM:  PTA/unknown  Height:   Ht Readings from Last 1 Encounters:  06/24/21 5\' 4"  (1.626 m)    Weight:   Wt Readings from Last 1 Encounters:  06/26/21 89.7 kg     Estimated Nutritional Needs:  Kcal:  1800-2000 kcal Protein:  90-100 grams Fluid:  >/= 2 L/day      08/26/21, MS, RD, LDN,  CNSC Inpatient Clinical Dietitian RD pager # available in [place]  After hours/weekend pager # available in [facility]

## 2021-06-26 NOTE — Progress Notes (Signed)
This RN noticed pt has persistent tremors in her hands causing artifact on her bedside monitor and difficulty holding her water cup. Pt is able to answer basic orientation questions, but seems to be very forgetful and intermittently confused. Dr. Alanda Slim notified by this RN, and orders received for a UDS, ammonia level, as well as other add-on labs. This RN will continue to carefully monitor pt.

## 2021-06-26 NOTE — Progress Notes (Signed)
PROGRESS NOTE  [name] YBO:[id number] DOB: [birth year]   PCP: Gwenlyn Found, MD  Patient is from: Home  DOA: 06/24/2021 LOS: 2  Chief complaints:  Chief Complaint  Patient presents with   Fall   Weakness     Brief Narrative / Interim history: 80 year old M with PMH of renal cystitis/ulcer followed at wound clinic, CKD-3B, asthma/COPD, neuropathy, HTN, osteoarthritis, anxiety, depression, RLS, diminished hearing and GERD presenting with generalized weakness, fall at home, increased leg pain, ulceration and drainage, and admitted for severe sepsis due to LLE cellulitis, AKI, hyperkalemia and acute urinary retention.  Cultures obtained.  Patient was started on IV ceftriaxone, IV vancomycin and IV fluid.  Wound care consulted.   Patient is improving.  Blood culture NGTD.  Urine culture with multiple species.  Superficial wound culture with few GPC's.  Remains on IV fluid, CTX and vancomycin.  Subjective: Seen and examined earlier this morning.  No major events overnight or this morning.  She says her left leg is jumpy when she elevates.  She attributes this to her neuropathy.  Her mouth feels dry.  She denies chest pain, dyspnea, GI or UTI symptoms.  Objective: Vitals:   06/26/21 0900 06/26/21 1000 06/26/21 1100 06/26/21 1130  BP: (!) 149/76 (!) 142/94 115/84   Pulse: (!) 131 (!) 105 (!) 127   Resp: (!) 23 (!) 22 (!) 24   Temp:    97.9 F (36.6 C)  TempSrc:    Oral  SpO2: 94% 96% 93%   Weight:      Height:        Intake/Output Summary (Last 24 hours) at 06/26/2021 1148 Last data filed at 06/26/2021 0928 Gross per 24 hour  Intake 1980.23 ml  Output 1100 ml  Net 880.23 ml   Filed Weights   06/24/21 2148 06/25/21 0500 06/26/21 0500  Weight: 88.6 kg 87.8 kg 89.7 kg    Examination:  GENERAL: No apparent distress.  Nontoxic. HEENT: MMM.  Vision and hearing grossly intact.  NECK: Supple.  No apparent JVD.  RESP: 93% on RA.  No IWOB.  Fair aeration bilaterally. CVS:  HR 110.  Regular rhythm.  Heart sounds normal.  ABD/GI/GU: BS+. Abd soft, NTND.  MSK/EXT:  Moves extremities.  Dressing and Ace wrap over BLE. SKIN: Dressing and Ace wrap over BLE. NEURO: Awake and alert. Oriented appropriately.  No apparent focal neuro deficit. PSYCH: Calm. Normal affect.   Procedures:  None  Microbiology summarized: COVID-19 and influenza PCR nonreactive. MRSA PCR screen positive. Blood cultures NGTD. Urine culture with multiple species. Superficial wound culture with GPC's  Assessment & Plan: Severe sepsis in the setting of LLE cellulitis: POA.  Recently started on p.o. doxycycline at wound clinic. Had significant leukocytosis with tachycardia, lactic acidosis and AKI on presentation.   MRSA PCR screen positive.  Sepsis physiology improved.  No osseous abnormality on x-ray. -Continue Vanco and CTX -Follow wound culture speciation and sensitivity -Appreciate help by WOCN.  AKI on CKD-3B/azotemia-likely due to sepsis, diuretics, ARB, NSAID use and rhabdo.  Improving. Elevated anion gap metabolic acidosis: Likely due to azotemia.  Improving. Recent Labs    06/24/21 1559 06/24/21 2206 06/25/21 0243 06/26/21 0242  BUN 98* 80* 73* 47*  CREATININE 4.66* 3.83* 3.19* 1.73*  -Renal US negative. -Avoid or minimize nephrotoxic meds -Continue IV fluid  Generalized weakness/fall at home/traumatic rhabdomyolysis: CK elevated to 1100> 643.  CT head without acute finding.  No significant injury.  CK improving. -Continue IV fluid -Check CK in  the morning -PT/OT eval   Acute urinary retention-reportedly had about 628 cc on bladder scan prevoid and Foley catheter was inserted.  Renal US without acute finding.  Failed voiding trial on 8/2. -Continue bethanechol -Voiding trial in the next 3 to 4 days   Hyperkalemia>> hypokalemia: Hypokalemia likely due to IV fluid -P.o. K-Dur 40 mill equivalent x2  Hyponatremia: In the setting of AKI.  Resolved. -Continue IV fluid    Elevated AST: Likely due to rhabdo.  Improved -Treat rhabdo as above   Neuropathic pain -Increase gabapentin to 200 mg twice daily   Sinus tachycardia/hypertension: Normotensive this morning.  HR slightly elevated probably from Theophylline. -Continue IV fluid -Continue holding Lasix and losartan   GERD -Continue Protonix  Depression and Anxiety: Stable -Continue bupropion 150 mg p.o. daily  Asthma and COPD: Stable. -Continue with theophylline and Advair substitution -Continue monitor respiratory status carefully and if necessary will place on nebs  Generalized weakness/physical deconditioning -PT/OT eval   Obesity Body mass index is 33.94 kg/m.         DVT prophylaxis:  heparin injection 5,000 Units Start: 06/25/21 0600  Code Status: DNR/DNI Family Communication: Patient and/or RN. Available if any question.  Level of care: Progressive.  Will transfer to progressive care Status is: Inpatient  Remains inpatient appropriate because:Hemodynamically unstable, IV treatments appropriate due to intensity of illness or inability to take PO, and Inpatient level of care appropriate due to severity of illness  Dispo: The patient is from: Home              Anticipated d/c is to:  To be determined              Patient currently is not medically stable to d/c.   Difficult to place patient No       Consultants:  None   Sch Meds:  Scheduled Meds:  bethanechol  10 mg Oral TID   buPROPion  150 mg Oral Daily   chlorhexidine  15 mL Mouth Rinse BID   Chlorhexidine Gluconate Cloth  6 each Topical Daily   gabapentin  200 mg Oral BID   heparin  5,000 Units Subcutaneous Q8H   mouth rinse  15 mL Mouth Rinse q12n4p   mometasone-formoterol  2 puff Inhalation BID   multivitamin with minerals  1 tablet Oral Daily   mupirocin ointment  1 application Nasal BID   pantoprazole  40 mg Oral Daily   potassium chloride  40 mEq Oral Q4H   silver sulfADIAZINE   Topical BID    theophylline  400 mg Oral Daily   Continuous Infusions:  cefTRIAXone (ROCEPHIN)  IV Stopped (06/25/21 1744)   dextrose 5 % and 0.9% NaCl 100 mL/hr at 06/26/21 1111   sodium chloride     vancomycin     PRN Meds:.acetaminophen **OR** acetaminophen, diphenhydrAMINE, hydrALAZINE, levalbuterol, nystatin, ondansetron **OR** ondansetron (ZOFRAN) IV, oxyCODONE, polyethylene glycol  Antimicrobials: Anti-infectives (From admission, onward)    Start     Dose/Rate Route Frequency Ordered Stop   06/26/21 1800  vancomycin (VANCOREADY) IVPB 500 mg/100 mL  Status:  Discontinued        500 mg 100 mL/hr over 60 Minutes Intravenous Every 48 hours 06/24/21 1917 06/26/21 0836   06/26/21 1800  vancomycin (VANCOCIN) IVPB 1000 mg/200 mL premix        1,000 mg 200 mL/hr over 60 Minutes Intravenous Every 48 hours 06/26/21 0836 07/02/21 1759   06/25/21 1600  cefTRIAXone (ROCEPHIN) 2 g in sodium chloride 0.9 %  100 mL IVPB        2 g 200 mL/hr over 30 Minutes Intravenous Every 24 hours 06/24/21 2059     06/24/21 2245  cefTRIAXone (ROCEPHIN) 2 g in sodium chloride 0.9 % 100 mL IVPB  Status:  Discontinued        2 g 200 mL/hr over 30 Minutes Intravenous  Once 06/24/21 2145 06/24/21 2149   06/24/21 2245  cefTRIAXone (ROCEPHIN) 2 g in sodium chloride 0.9 % 100 mL IVPB  Status:  Discontinued        2 g 200 mL/hr over 30 Minutes Intravenous Every 24 hours 06/24/21 2145 06/24/21 2149   06/24/21 2100  cefTRIAXone (ROCEPHIN) 2 g in sodium chloride 0.9 % 100 mL IVPB  Status:  Discontinued        2 g 200 mL/hr over 30 Minutes Intravenous Every 24 hours 06/24/21 2059 06/24/21 2100   06/24/21 1530  vancomycin (VANCOCIN) IVPB 1000 mg/200 mL premix        1,000 mg 200 mL/hr over 60 Minutes Intravenous  Once 06/24/21 1520 06/24/21 1854   06/24/21 1530  cefTRIAXone (ROCEPHIN) 2 g in sodium chloride 0.9 % 100 mL IVPB        2 g 200 mL/hr over 30 Minutes Intravenous  Once 06/24/21 1520 06/24/21 1650        I have  personally reviewed the following labs and images: CBC: Recent Labs  Lab 06/24/21 1559 06/24/21 2206 06/25/21 0243 06/26/21 0242  WBC 22.0* 18.0* 15.6* 10.3  NEUTROABS 19.3*  --   --  7.4  HGB 14.8 13.1 12.8 12.1  HCT 44.6 41.0 38.1 38.1  MCV 99.3 101.5* 98.2 102.1*  PLT 414* 341 303 297   BMP &GFR Recent Labs  Lab 06/24/21 1559 06/24/21 1800 06/24/21 2206 06/25/21 0243 06/26/21 0242  NA 134*  --  134* 137 138  K 6.4*  --  4.3 4.2 3.2*  CL 94*  --  98 105 110  CO2 22  --  20* 20* 17*  GLUCOSE 103* 90 100* 90 82  BUN 98*  --  80* 73* 47*  CREATININE 4.66*  --  3.83* 3.19* 1.73*  CALCIUM 10.1  --  9.2 9.1 8.8*  MG  --   --  2.6*  --  2.2  PHOS  --   --  5.9*  --  3.6   Estimated Creatinine Clearance: 28.1 mL/min (A) (by C-G formula based on SCr of 1.73 mg/dL (H)). Liver & Pancreas: Recent Labs  Lab 06/24/21 1559 06/24/21 2206 06/25/21 0243 06/26/21 0242  AST 52* 61* 65* 60*  ALT 25 29 28  33  ALKPHOS 103 79 74 75  BILITOT 0.7 0.8 0.7 0.8  PROT 8.0 6.6 6.4* 5.9*  ALBUMIN 3.2* 2.8* 2.5* 2.4*   No results for input(s): LIPASE, AMYLASE in the last 168 hours. No results for input(s): AMMONIA in the last 168 hours. Diabetic: Recent Labs    06/24/21 2206  HGBA1C 5.9*   Recent Labs  Lab 06/25/21 1954 06/25/21 2320 06/26/21 0335 06/26/21 0810 06/26/21 1113  GLUCAP 73 75 80 81 90   Cardiac Enzymes: Recent Labs  Lab 06/25/21 0728 06/26/21 0242  CKTOTAL 1,100* 643*   No results for input(s): PROBNP in the last 8760 hours. Coagulation Profile: Recent Labs  Lab 06/24/21 1559  INR 1.0   Thyroid Function Tests: Recent Labs    06/26/21 0242  TSH 3.983   Lipid Profile: No results for input(s): CHOL, HDL, LDLCALC, TRIG,  CHOLHDL, LDLDIRECT in the last 72 hours. Anemia Panel: No results for input(s): VITAMINB12, FOLATE, FERRITIN, TIBC, IRON, RETICCTPCT in the last 72 hours. Urine analysis:    Component Value Date/Time   COLORURINE YELLOW 06/24/2021  2100   APPEARANCEUR HAZY (A) 06/24/2021 2100   LABSPEC 1.017 06/24/2021 2100   PHURINE 6.0 06/24/2021 2100   GLUCOSEU NEGATIVE 06/24/2021 2100   HGBUR MODERATE (A) 06/24/2021 2100   BILIRUBINUR NEGATIVE 06/24/2021 2100   KETONESUR NEGATIVE 06/24/2021 2100   PROTEINUR 100 (A) 06/24/2021 2100   NITRITE NEGATIVE 06/24/2021 2100   LEUKOCYTESUR MODERATE (A) 06/24/2021 2100   Sepsis Labs: Invalid input(s): PROCALCITONIN, LACTICIDVEN  Microbiology: Recent Results (from the past 240 hour(s))  Blood Culture (routine x 2)     Status: None (Preliminary result)   Collection Time: 06/24/21  3:48 PM   Specimen: BLOOD  Result Value Ref Range Status   Specimen Description   Final    BLOOD LEFT ANTECUBITAL Performed at [facility], [address], [place], [place] [address]    Special Requests   Final    BOTTLES DRAWN AEROBIC AND ANAEROBIC Blood Culture adequate volume Performed at [facility], [address], [place], [place] [address]    Culture   Final    NO GROWTH 2 DAYS Performed at [facility], [address], [place], [place] [address]    Report Status PENDING  Incomplete  Blood Culture (routine x 2)     Status: None (Preliminary result)   Collection Time: 06/24/21  3:48 PM   Specimen: BLOOD  Result Value Ref Range Status   Specimen Description   Final    BLOOD RIGHT ANTECUBITAL Performed at [facility], [address], [place], [place] [address]    Special Requests   Final    BOTTLES DRAWN AEROBIC AND ANAEROBIC Blood Culture adequate volume Performed at [facility], [address], [place], [place] [address]    Culture   Final    NO GROWTH 2 DAYS Performed at [facility], [address] [place], [place] [address]    Report Status PENDING  Incomplete  Resp Panel by RT-PCR (Flu A&B, Covid) Nasopharyngeal Swab     Status: None   Collection Time: 06/24/21  3:51 PM   Specimen:  Nasopharyngeal Swab; Nasopharyngeal(NP) swabs in vial transport medium  Result Value Ref Range Status   SARS Coronavirus 2 by RT PCR NEGATIVE NEGATIVE Final    Comment: (NOTE) SARS-CoV-2 target nucleic acids are NOT DETECTED.  The SARS-CoV-2 RNA is generally detectable in upper respiratory specimens during the acute phase of infection. The lowest concentration of SARS-CoV-2 viral copies this assay can detect is 138 copies/mL. A negative result does not preclude SARS-Cov-2 infection and should not be used as the sole basis for treatment or other patient management decisions. A negative result may occur with  improper specimen collection/handling, submission of specimen other than nasopharyngeal swab, presence of viral mutation(s) within the areas targeted by this assay, and inadequate number of viral copies(<138 copies/mL). A negative result must be combined with clinical observations, patient history, and epidemiological information. The expected result is Negative.  Fact Sheet for Patients:  BloggerCourse.com  Fact Sheet for Healthcare Providers:  SeriousBroker.it  This test is no t yet approved or cleared by the [place] FDA and  has been authorized for detection and/or diagnosis of SARS-CoV-2 by FDA under an Emergency Use Authorization (EUA). This EUA will remain  in effect (meaning this test can be used) for the duration of the COVID-19 declaration under Section 564(b)(1) of the Act, 21 U.S.C.section 360bbb-3(b)(1), unless the authorization is terminated  or revoked sooner.       Influenza A by PCR NEGATIVE NEGATIVE Final   Influenza B by PCR NEGATIVE NEGATIVE Final    Comment: (NOTE) The Xpert Xpress SARS-CoV-2/FLU/RSV plus assay is intended as an aid in the diagnosis of influenza from Nasopharyngeal swab specimens and should not be used as a sole basis for treatment. Nasal washings and aspirates are unacceptable for  Xpert Xpress SARS-CoV-2/FLU/RSV testing.  Fact Sheet for Patients: BloggerCourse.com  Fact Sheet for Healthcare Providers: SeriousBroker.it  This test is not yet approved or cleared by the [place] FDA and has been authorized for detection and/or diagnosis of SARS-CoV-2 by FDA under an Emergency Use Authorization (EUA). This EUA will remain in effect (meaning this test can be used) for the duration of the COVID-19 declaration under Section 564(b)(1) of the Act, 21 U.S.C. section 360bbb-3(b)(1), unless the authorization is terminated or revoked.  Performed at [facility], [address], [place], [place] [address]   Urine Culture     Status: Abnormal   Collection Time: 06/24/21  9:00 PM   Specimen: In/Out Cath Urine  Result Value Ref Range Status   Specimen Description   Final    IN/OUT CATH URINE Performed at [facility], [address] [place], [place] [address]    Special Requests   Final    NONE Performed at [facility], [address], [place], [place] [address]    Culture (A)  Final    >=100,000 COLONIES/mL MULTIPLE SPECIES PRESENT, SUGGEST RECOLLECTION   Report Status 06/26/2021 FINAL  Final  MRSA Next Gen by PCR, Nasal     Status: Abnormal   Collection Time: 06/24/21  9:47 PM   Specimen: Nasal Mucosa; Nasal Swab  Result Value Ref Range Status   MRSA by PCR Next Gen DETECTED (A) NOT DETECTED Final    Comment: RESULT CALLED TO, READ BACK BY AND VERIFIED WITH: RN S MORDUE AT 2351 06/24/21 CRUICKSHANK A (NOTE) The GeneXpert MRSA Assay (FDA approved for NASAL specimens only), is one component of a comprehensive MRSA colonization surveillance program. It is not intended to diagnose MRSA infection nor to guide or monitor treatment for MRSA infections. Test performance is not FDA approved in patients less than 53 years old. Performed at [facility], [address], [place], [place] [address]   Aerobic Culture w Gram Stain (superficial specimen)     Status: None (Preliminary result)   Collection Time: 06/25/21  1:05 PM   Specimen: Leg  Result Value Ref Range Status   Specimen Description   Final    LEG LEFT Performed at [facility], [address] [place], [place] [address]    Special Requests   Final    NONE Performed at [facility], [address], [place], [place] [address]    Gram Stain   Final    RARE WBC PRESENT, PREDOMINANTLY PMN NO ORGANISMS SEEN    Culture   Final    CULTURE REINCUBATED FOR BETTER GROWTH Performed at [facility], [address] [place], [place] [address]    Report Status PENDING  Incomplete  Aerobic Culture w Gram Stain (superficial specimen)     Status: None (Preliminary result)   Collection Time: 06/25/21  1:05 PM  Specimen: Toe  Result Value Ref Range Status   Specimen Description   Final    TOE LEFT INTERDIGITAL Performed at [facility], [address] [place], [place] [address]    Special Requests   Final    NONE Performed at [facility], [address], [place], [place] [address]    Gram Stain NO WBC SEEN FEW GRAM POSITIVE COCCI   Final   Culture   Final    CULTURE REINCUBATED FOR BETTER GROWTH Performed at [facility], [address] [place], [place] [address]    Report Status PENDING  Incomplete    Radiology Studies: No results found.     Cledis Sohn T. Laylia Mui [facility]  If 7PM-7AM, please contact night-coverage [email or web address] 06/26/2021, 11:48 AM

## 2021-06-27 ENCOUNTER — Encounter (HOSPITAL_COMMUNITY): Payer: Self-pay | Admitting: Internal Medicine

## 2021-06-27 LAB — CBC
HCT: 35.3 % — ABNORMAL LOW (ref 36.0–46.0)
Hemoglobin: 11.4 g/dL — ABNORMAL LOW (ref 12.0–15.0)
MCH: 32.8 pg (ref 26.0–34.0)
MCHC: 32.3 g/dL (ref 30.0–36.0)
MCV: 101.4 fL — ABNORMAL HIGH (ref 80.0–100.0)
Platelets: 295 10*3/uL (ref 150–400)
RBC: 3.48 MIL/uL — ABNORMAL LOW (ref 3.87–5.11)
RDW: 13.2 % (ref 11.5–15.5)
WBC: 9.2 10*3/uL (ref 4.0–10.5)
nRBC: 0 % (ref 0.0–0.2)

## 2021-06-27 LAB — RENAL FUNCTION PANEL
Albumin: 2.2 g/dL — ABNORMAL LOW (ref 3.5–5.0)
Anion gap: 6 (ref 5–15)
BUN: 29 mg/dL — ABNORMAL HIGH (ref 8–23)
CO2: 20 mmol/L — ABNORMAL LOW (ref 22–32)
Calcium: 8.8 mg/dL — ABNORMAL LOW (ref 8.9–10.3)
Chloride: 113 mmol/L — ABNORMAL HIGH (ref 98–111)
Creatinine, Ser: 1.22 mg/dL — ABNORMAL HIGH (ref 0.44–1.00)
GFR, Estimated: 45 mL/min — ABNORMAL LOW (ref >60)
Glucose, Bld: 102 mg/dL — ABNORMAL HIGH (ref 70–99)
Phosphorus: 1.4 mg/dL — ABNORMAL LOW (ref 2.5–4.6)
Potassium: 4 mmol/L (ref 3.5–5.1)
Sodium: 139 mmol/L (ref 135–145)

## 2021-06-27 LAB — GLUCOSE, CAPILLARY
Glucose-Capillary: 100 mg/dL — ABNORMAL HIGH (ref 70–99)
Glucose-Capillary: 108 mg/dL — ABNORMAL HIGH (ref 70–99)
Glucose-Capillary: 113 mg/dL — ABNORMAL HIGH (ref 70–99)
Glucose-Capillary: 113 mg/dL — ABNORMAL HIGH (ref 70–99)
Glucose-Capillary: 120 mg/dL — ABNORMAL HIGH (ref 70–99)
Glucose-Capillary: 140 mg/dL — ABNORMAL HIGH (ref 70–99)

## 2021-06-27 LAB — CK: Total CK: 402 U/L — ABNORMAL HIGH (ref 38–234)

## 2021-06-27 LAB — MAGNESIUM: Magnesium: 2 mg/dL (ref 1.7–2.4)

## 2021-06-27 LAB — RPR: RPR Ser Ql: NONREACTIVE

## 2021-06-27 MED ORDER — VANCOMYCIN HCL 750 MG/150ML IV SOLN
750.0000 mg | INTRAVENOUS | Status: DC
  Administered 2021-06-27: 750 mg via INTRAVENOUS
  Filled 2021-06-27 (×2): qty 150

## 2021-06-27 MED ORDER — GABAPENTIN 100 MG PO CAPS
200.0000 mg | ORAL_CAPSULE | Freq: Three times a day (TID) | ORAL | Status: DC
  Administered 2021-06-27 – 2021-07-02 (×14): 200 mg via ORAL
  Filled 2021-06-27 (×14): qty 2

## 2021-06-27 MED ORDER — GABAPENTIN 300 MG PO CAPS: 300.0000 mg | ORAL_CAPSULE | Freq: Two times a day (BID) | ORAL | Status: DC

## 2021-06-27 NOTE — Progress Notes (Signed)
Pharmacy Antibiotic Note  [name] is a 80 y.o. female admitted on 06/24/2021 with cellulitis.  Patient was recently seen at her wound care clinic and given a prescription for doxycycline, however the wound has progressively been getting worse and patient unable to ambulate. Pharmacy has been consulted for vancomycin dosing for a duration of 7 days.   Day 3 total Abx - vanc/rocephin Afebrile WBC 9.2 improved SCr 1.22 improved Cultures still pending  Plan: Adjust vancomycin from 1g IV q48 to 750mg  IV q24 due to improved renal function Ceftriaxone 2g IV q24 hours per MD Monitor clinical picture, renal function, vancomycin levels if indicated F/U C&S, abx deescalation / LOT  Height: 5\' 4"  (162.6 cm) Weight: 91.9 kg (202 lb 9.6 oz) IBW/kg (Calculated) : 54.7  Temp (24hrs), Avg:98.2 F (36.8 C), Min:97.9 F (36.6 C), Max:98.6 F (37 C)  Recent Labs  Lab 06/24/21 1559 06/24/21 1719 06/24/21 2206 06/25/21 0243 06/26/21 0242 06/27/21 0457  WBC 22.0*  --  18.0* 15.6* 10.3 9.2  CREATININE 4.66*  --  3.83* 3.19* 1.73* 1.22*  LATICACIDVEN 1.9 2.4*  --   --   --   --      Estimated Creatinine Clearance: 40.4 mL/min (A) (by C-G formula based on SCr of 1.22 mg/dL (H)).    Allergies  Allergen Reactions   Meloxicam Rash    Antimicrobials this admission: Ceftriaxone 8/1 >> Vancomycin 8/1 >> (plan 7 days per consult)  Microbiology results: 8/1 BCx: ngtd 8/1 Ucx: mult species present 8/2 L toe culture: few gram pos cocci on gram stain 8/2 L leg culture: ngtd 8/1 MRSA swab: pos  Thank you for allowing pharmacy to be a part of this patient's care.   10/1, PharmD, BCPS Secure Chat if ?s 06/27/2021 8:09 AM

## 2021-06-27 NOTE — Progress Notes (Signed)
PROGRESS NOTE  [name] XAJ:[id number] DOB: Sep 06, 1941   PCP: Gwenlyn Found, MD  Patient is from: Home  DOA: 06/24/2021 LOS: 3  Chief complaints:  Chief Complaint  Patient presents with   Fall   Weakness     Brief Narrative / Interim history: 80 year old M with PMH of renal cystitis/ulcer followed at wound clinic, CKD-3B, asthma/COPD, neuropathy, HTN, osteoarthritis, anxiety, depression, RLS, diminished hearing and GERD presenting with generalized weakness, fall at home, increased leg pain, ulceration and drainage, and admitted for severe sepsis due to LLE cellulitis, AKI, hyperkalemia and acute urinary retention.  Cultures obtained.  Patient was started on IV ceftriaxone, IV vancomycin and IV fluid.  Wound care consulted.   Patient is improving.  Blood culture NGTD.  Urine culture with multiple species.  Superficial wound culture from left leg with few staph aureus and rare GNR.  Superficial wound culture from foot with few GPC's.  Remains on IV fluid, CTX and vancomycin.  Subjective: Seen and examined earlier this morning.  No major events overnight of this morning.  Reports neuropathic pain in her legs during dressing change.  She did not take pain medication prior to dressing change.  She accidentally spilled maple syrup on her self, and getting ready to be cleaned up.  She denies chest pain, shortness of breath, GI or UTI symptoms.  Objective: Vitals:   06/27/21 0429 06/27/21 0500 06/27/21 1005 06/27/21 1311  BP: 124/72  118/70 (!) 146/88  Pulse: (!) 103  (!) 104 100  Resp: 16     Temp: 98.6 F (37 C)  98.3 F (36.8 C) 98.1 F (36.7 C)  TempSrc:   Oral Oral  SpO2: 95%  96% 96%  Weight:  91.9 kg    Height:        Intake/Output Summary (Last 24 hours) at 06/27/2021 1626 Last data filed at 06/27/2021 1110 Gross per 24 hour  Intake 964.91 ml  Output 625 ml  Net 339.91 ml   Filed Weights   06/25/21 0500 06/26/21 0500 06/27/21 0500  Weight: 87.8 kg 89.7 kg 91.9  kg    Examination:  GENERAL: No apparent distress.  Nontoxic. HEENT: MMM.  Vision and hearing grossly intact.  NECK: Supple.  No apparent JVD.  RESP: 95% on RA.  No IWOB.  Fair aeration bilaterally. CVS: HR in 100s.  Regular rhythm. Heart sounds normal.  ABD/GI/GU: BS+. Abd soft, NTND.  MSK/EXT:  Moves extremities.  Dressing and Ace wrap over BLE. SKIN: Dressing and Ace wrap over BLE. NEURO: Awake and alert. Oriented appropriately.  Some sense of humor.  No apparent focal neuro deficit. PSYCH: Calm. Normal affect.   Procedures:  None  Microbiology summarized: COVID-19 and influenza PCR nonreactive. MRSA PCR screen positive. Blood cultures NGTD. Urine culture with multiple species. Superficial wound culture from left foot with GPC's Superficial wound culture from left leg with few staph aureus and rare GNR.   Assessment & Plan: Severe sepsis in the setting of LLE cellulitis: POA.  Recently started on p.o. doxycycline at wound clinic. Had significant leukocytosis with tachycardia, lactic acidosis and AKI on presentation.   MRSA PCR screen positive.  Sepsis physiology improved.  Tachycardia could be due to her theophylline.  No osseous abnormality on x-ray. -Continue Vanco and CTX -Follow wound culture speciation and sensitivity -Appreciate help by WOCN.  AKI on CKD-3B/azotemia-likely due to sepsis, diuretics, ARB, NSAID use and rhabdo.  Improving. Elevated anion gap metabolic acidosis: Likely due to azotemia.  Improving. Recent Labs  06/24/21 1559 06/24/21 2206 06/25/21 0243 06/26/21 0242 06/27/21 0457  BUN 98* 80* 73* 47* 29*  CREATININE 4.66* 3.83* 3.19* 1.73* 1.22*  -Renal US negative. -Avoid or minimize nephrotoxic meds -Continue IV fluid  Generalized weakness/fall at home/traumatic rhabdomyolysis: CK elevated to 1100> 643.  CT head without acute finding.  No significant injury.  CK improving. -Continue IV fluid -Recheck CK in the morning -PT/OT eval-recommended  SNF.   Acute urinary retention-reportedly had about 628 cc on bladder scan prevoid and Foley catheter was inserted.  Renal US without acute finding.  Failed voiding trial on 8/2. -Continue bethanechol -Voiding trial in the next 3 to 4 days   Hyperkalemia>> hypokalemia: Resolved.  Hyponatremia: In the setting of AKI.  Resolved. -Continue IV fluid   Elevated AST: Likely due to rhabdo.  Improved -Treat rhabdo as above   Neuropathic pain-continues to endorse significant pain. -Increase gabapentin from 200 mg twice daily to 3 times daily   Sinus tachycardia/hypertension: Normotensive.  HR slightly elevated probably from Theophylline. -Continue IV fluid -Continue holding Lasix and losartan   GERD -Continue Protonix  Depression and Anxiety: Stable -Continue bupropion 150 mg p.o. daily  Asthma and COPD: Stable. -Continue with theophylline and Advair substitution -Continue monitor respiratory status carefully and if necessary will place on nebs  Obesity Body mass index is 34.78 kg/m. Nutrition Problem: Increased nutrient needs Etiology: acute illness, wound healing Signs/Symptoms: estimated needs Interventions: MVI, Ensure Enlive (each supplement provides 350kcal and 20 grams of protein), Prostat   DVT prophylaxis:  heparin injection 5,000 Units Start: 06/25/21 0600  Code Status: DNR/DNI Family Communication: Patient and/or RN. Available if any question.  Level of care: Progressive.  Status is: Inpatient  Remains inpatient appropriate because:Hemodynamically unstable, Unsafe d/c plan, IV treatments appropriate due to intensity of illness or inability to take PO, and Inpatient level of care appropriate due to severity of illness  Dispo: The patient is from: Home              Anticipated d/c is to:  SNF              Patient currently is not medically stable to d/c.   Difficult to place patient No       Consultants:  None   Sch Meds:  Scheduled Meds:  (feeding  supplement) PROSource Plus  30 mL Oral BID BM   bethanechol  10 mg Oral TID   buPROPion  150 mg Oral Daily   chlorhexidine  15 mL Mouth Rinse BID   Chlorhexidine Gluconate Cloth  6 each Topical Daily   feeding supplement  237 mL Oral Q24H   gabapentin  200 mg Oral TID   heparin  5,000 Units Subcutaneous Q8H   mouth rinse  15 mL Mouth Rinse q12n4p   mometasone-formoterol  2 puff Inhalation BID   multivitamin with minerals  1 tablet Oral Daily   mupirocin ointment  1 application Nasal BID   pantoprazole  40 mg Oral Daily   silver sulfADIAZINE   Topical BID   theophylline  400 mg Oral Daily   Continuous Infusions:  cefTRIAXone (ROCEPHIN)  IV Stopped (06/26/21 1706)   sodium chloride     vancomycin     PRN Meds:.acetaminophen **OR** acetaminophen, diphenhydrAMINE, hydrALAZINE, levalbuterol, nystatin, ondansetron **OR** ondansetron (ZOFRAN) IV, oxyCODONE, polyethylene glycol  Antimicrobials: Anti-infectives (From admission, onward)    Start     Dose/Rate Route Frequency Ordered Stop   06/27/21 1800  vancomycin (VANCOREADY) IVPB 750 mg/150 mL  750 mg 150 mL/hr over 60 Minutes Intravenous Every 24 hours 06/27/21 0811     06/26/21 1800  vancomycin (VANCOREADY) IVPB 500 mg/100 mL  Status:  Discontinued        500 mg 100 mL/hr over 60 Minutes Intravenous Every 48 hours 06/24/21 1917 06/26/21 0836   06/26/21 1800  vancomycin (VANCOCIN) IVPB 1000 mg/200 mL premix  Status:  Discontinued        1,000 mg 200 mL/hr over 60 Minutes Intravenous Every 48 hours 06/26/21 0836 06/27/21 0811   06/25/21 1600  cefTRIAXone (ROCEPHIN) 2 g in sodium chloride 0.9 % 100 mL IVPB        2 g 200 mL/hr over 30 Minutes Intravenous Every 24 hours 06/24/21 2059     06/24/21 2245  cefTRIAXone (ROCEPHIN) 2 g in sodium chloride 0.9 % 100 mL IVPB  Status:  Discontinued        2 g 200 mL/hr over 30 Minutes Intravenous  Once 06/24/21 2145 06/24/21 2149   06/24/21 2245  cefTRIAXone (ROCEPHIN) 2 g in sodium  chloride 0.9 % 100 mL IVPB  Status:  Discontinued        2 g 200 mL/hr over 30 Minutes Intravenous Every 24 hours 06/24/21 2145 06/24/21 2149   06/24/21 2100  cefTRIAXone (ROCEPHIN) 2 g in sodium chloride 0.9 % 100 mL IVPB  Status:  Discontinued        2 g 200 mL/hr over 30 Minutes Intravenous Every 24 hours 06/24/21 2059 06/24/21 2100   06/24/21 1530  vancomycin (VANCOCIN) IVPB 1000 mg/200 mL premix        1,000 mg 200 mL/hr over 60 Minutes Intravenous  Once 06/24/21 1520 06/24/21 1854   06/24/21 1530  cefTRIAXone (ROCEPHIN) 2 g in sodium chloride 0.9 % 100 mL IVPB        2 g 200 mL/hr over 30 Minutes Intravenous  Once 06/24/21 1520 06/24/21 1650        I have personally reviewed the following labs and images: CBC: Recent Labs  Lab 06/24/21 1559 06/24/21 2206 06/25/21 0243 06/26/21 0242 06/27/21 0457  WBC 22.0* 18.0* 15.6* 10.3 9.2  NEUTROABS 19.3*  --   --  7.4  --   HGB 14.8 13.1 12.8 12.1 11.4*  HCT 44.6 41.0 38.1 38.1 35.3*  MCV 99.3 101.5* 98.2 102.1* 101.4*  PLT 414* 341 303 297 295   BMP &GFR Recent Labs  Lab 06/24/21 1559 06/24/21 1800 06/24/21 2206 06/25/21 0243 06/26/21 0242 06/27/21 0457  NA 134*  --  134* 137 138 139  K 6.4*  --  4.3 4.2 3.2* 4.0  CL 94*  --  98 105 110 113*  CO2 22  --  20* 20* 17* 20*  GLUCOSE 103* 90 100* 90 82 102*  BUN 98*  --  80* 73* 47* 29*  CREATININE 4.66*  --  3.83* 3.19* 1.73* 1.22*  CALCIUM 10.1  --  9.2 9.1 8.8* 8.8*  MG  --   --  2.6*  --  2.2 2.0  PHOS  --   --  5.9*  --  3.6 1.4*   Estimated Creatinine Clearance: 40.4 mL/min (A) (by C-G formula based on SCr of 1.22 mg/dL (H)). Liver & Pancreas: Recent Labs  Lab 06/24/21 1559 06/24/21 2206 06/25/21 0243 06/26/21 0242 06/27/21 0457  AST 52* 61* 65* 60*  --   ALT 25 29 28  33  --   ALKPHOS 103 79 74 75  --   BILITOT 0.7 0.8  0.7 0.8  --   PROT 8.0 6.6 6.4* 5.9*  --   ALBUMIN 3.2* 2.8* 2.5* 2.4* 2.2*   No results for input(s): LIPASE, AMYLASE in the last 168  hours. Recent Labs  Lab 06/26/21 1442  AMMONIA 17   Diabetic: Recent Labs    06/24/21 2206  HGBA1C 5.9*   Recent Labs  Lab 06/26/21 2337 06/27/21 0027 06/27/21 0427 06/27/21 0831 06/27/21 1309  GLUCAP 123* 120* 113* 108* 113*   Cardiac Enzymes: Recent Labs  Lab 06/25/21 0728 06/26/21 0242 06/27/21 0457  CKTOTAL 1,100* 643* 402*   No results for input(s): PROBNP in the last 8760 hours. Coagulation Profile: Recent Labs  Lab 06/24/21 1559  INR 1.0   Thyroid Function Tests: Recent Labs    06/26/21 0242  TSH 3.983   Lipid Profile: No results for input(s): CHOL, HDL, LDLCALC, TRIG, CHOLHDL, LDLDIRECT in the last 72 hours. Anemia Panel: Recent Labs    06/26/21 1442  VITAMINB12 1,180*   Urine analysis:    Component Value Date/Time   COLORURINE YELLOW 06/24/2021 2100   APPEARANCEUR HAZY (A) 06/24/2021 2100   LABSPEC 1.017 06/24/2021 2100   PHURINE 6.0 06/24/2021 2100   GLUCOSEU NEGATIVE 06/24/2021 2100   HGBUR MODERATE (A) 06/24/2021 2100   BILIRUBINUR NEGATIVE 06/24/2021 2100   KETONESUR NEGATIVE 06/24/2021 2100   PROTEINUR 100 (A) 06/24/2021 2100   NITRITE NEGATIVE 06/24/2021 2100   LEUKOCYTESUR MODERATE (A) 06/24/2021 2100   Sepsis Labs: Invalid input(s): PROCALCITONIN, LACTICIDVEN  Microbiology: Recent Results (from the past 240 hour(s))  Blood Culture (routine x 2)     Status: None (Preliminary result)   Collection Time: 06/24/21  3:48 PM   Specimen: BLOOD  Result Value Ref Range Status   Specimen Description   Final    BLOOD LEFT ANTECUBITAL Performed at [facility], [address] [place], [place] [address]    Special Requests   Final    BOTTLES DRAWN AEROBIC AND ANAEROBIC Blood Culture adequate volume Performed at [facility], [address] [place], [place] [address]    Culture   Final    NO GROWTH 3 DAYS Performed at [facility], [address] [place], [place] [address]    Report  Status PENDING  Incomplete  Blood Culture (routine x 2)     Status: None (Preliminary result)   Collection Time: 06/24/21  3:48 PM   Specimen: BLOOD  Result Value Ref Range Status   Specimen Description   Final    BLOOD RIGHT ANTECUBITAL Performed at [facility], [address] [place], [place] [address]    Special Requests   Final    BOTTLES DRAWN AEROBIC AND ANAEROBIC Blood Culture adequate volume Performed at [facility], [address] [place], [place] [address]    Culture   Final    NO GROWTH 3 DAYS Performed at [facility], [address] [place], [place] [address]    Report Status PENDING  Incomplete  Resp Panel by RT-PCR (Flu A&B, Covid) Nasopharyngeal Swab     Status: None   Collection Time: 06/24/21  3:51 PM   Specimen: Nasopharyngeal Swab; Nasopharyngeal(NP) swabs in vial transport medium  Result Value Ref Range Status   SARS Coronavirus 2 by RT PCR NEGATIVE NEGATIVE Final    Comment: (NOTE) SARS-CoV-2 target nucleic acids are NOT DETECTED.  The SARS-CoV-2 RNA is generally detectable in upper respiratory specimens during the acute phase of infection. The  lowest concentration of SARS-CoV-2 viral copies this assay can detect is 138 copies/mL. A negative result does not preclude SARS-Cov-2 infection and should not be used as the sole basis for treatment or other patient management decisions. A negative result may occur with  improper specimen collection/handling, submission of specimen other than nasopharyngeal swab, presence of viral mutation(s) within the areas targeted by this assay, and inadequate number of viral copies(<138 copies/mL). A negative result must be combined with clinical observations, patient history, and epidemiological information. The expected result is Negative.  Fact Sheet for Patients:  BloggerCourse.com  Fact Sheet for Healthcare Providers:   SeriousBroker.it  This test is no t yet approved or cleared by the [place] FDA and  has been authorized for detection and/or diagnosis of SARS-CoV-2 by FDA under an Emergency Use Authorization (EUA). This EUA will remain  in effect (meaning this test can be used) for the duration of the COVID-19 declaration under Section 564(b)(1) of the Act, 21 U.S.C.section 360bbb-3(b)(1), unless the authorization is terminated  or revoked sooner.       Influenza A by PCR NEGATIVE NEGATIVE Final   Influenza B by PCR NEGATIVE NEGATIVE Final    Comment: (NOTE) The Xpert Xpress SARS-CoV-2/FLU/RSV plus assay is intended as an aid in the diagnosis of influenza from Nasopharyngeal swab specimens and should not be used as a sole basis for treatment. Nasal washings and aspirates are unacceptable for Xpert Xpress SARS-CoV-2/FLU/RSV testing.  Fact Sheet for Patients: BloggerCourse.com  Fact Sheet for Healthcare Providers: SeriousBroker.it  This test is not yet approved or cleared by the [place] FDA and has been authorized for detection and/or diagnosis of SARS-CoV-2 by FDA under an Emergency Use Authorization (EUA). This EUA will remain in effect (meaning this test can be used) for the duration of the COVID-19 declaration under Section 564(b)(1) of the Act, 21 U.S.C. section 360bbb-3(b)(1), unless the authorization is terminated or revoked.  Performed at [facility], [address] [place], [place] [address]   Urine Culture     Status: Abnormal   Collection Time: 06/24/21  9:00 PM   Specimen: In/Out Cath Urine  Result Value Ref Range Status   Specimen Description   Final    IN/OUT CATH URINE Performed at [facility], [address], [place], [place] [address]    Special Requests   Final    NONE Performed at [facility], [address],  [place], [place] [address]    Culture (A)  Final    >=100,000 COLONIES/mL MULTIPLE SPECIES PRESENT, SUGGEST RECOLLECTION   Report Status 06/26/2021 FINAL  Final  MRSA Next Gen by PCR, Nasal     Status: Abnormal   Collection Time: 06/24/21  9:47 PM   Specimen: Nasal Mucosa; Nasal Swab  Result Value Ref Range Status   MRSA by PCR Next Gen DETECTED (A) NOT DETECTED Final    Comment: RESULT CALLED TO, READ BACK BY AND VERIFIED WITH: RN S MORDUE AT 2351 06/24/21 CRUICKSHANK A (NOTE) The GeneXpert MRSA Assay (FDA approved for NASAL specimens only), is one component of a comprehensive MRSA colonization surveillance program. It is not intended to diagnose MRSA infection nor to guide or monitor treatment for MRSA infections. Test performance is not FDA approved in patients less than 63 years old. Performed at [facility], [address], [place], [place] [address]   Aerobic Culture w Gram Stain (superficial specimen)     Status: None (Preliminary result)  Collection Time: 06/25/21  1:05 PM   Specimen: Leg  Result Value Ref Range Status   Specimen Description   Final    LEG LEFT Performed at [facility], [address] [place], [place] [address]    Special Requests   Final    NONE Performed at [facility], [address], [place], [place] [address]    Gram Stain   Final    RARE WBC PRESENT, PREDOMINANTLY PMN NO ORGANISMS SEEN    Culture   Final    FEW STAPHYLOCOCCUS AUREUS SUSCEPTIBILITIES TO FOLLOW RARE GRAM NEGATIVE RODS IDENTIFICATION AND SUSCEPTIBILITIES TO FOLLOW Performed at [facility], [address] [place], [place] [address]    Report Status PENDING  Incomplete  Aerobic Culture w Gram Stain (superficial specimen)     Status: None (Preliminary result)   Collection Time: 06/25/21  1:05 PM   Specimen: Toe  Result Value Ref Range Status   Specimen Description   Final    TOE LEFT INTERDIGITAL Performed at  [facility], [address] [place], [place] [address]    Special Requests   Final    NONE Performed at [facility], [address], [place], [place] [address]    Gram Stain NO WBC SEEN FEW GRAM POSITIVE COCCI   Final   Culture   Final    CULTURE REINCUBATED FOR BETTER GROWTH Performed at [facility], [address], [place], [place] [address]    Report Status PENDING  Incomplete    Radiology Studies: No results found.     Emanual Lamountain T. Vaniyah Lansky [facility]  If 7PM-7AM, please contact night-coverage [email or web address] 06/27/2021, 4:26 PM

## 2021-06-27 NOTE — Progress Notes (Signed)
PT Cancellation Note  Patient Details Name: Ashleynicole Mcclees MRN: [id number] DOB: 1941-07-20   Cancelled Treatment:    Reason Eval/Treat Not Completed: Other (comment)RN with patient hanging IV's and will need dressing change. Will check back in AM.   Rada Hay 06/27/2021, 4:16 PM  Blanchard Kelch PT Acute Rehabilitation Services Pager [phone] Office [phone]

## 2021-06-27 NOTE — Evaluation (Addendum)
Occupational Therapy Evaluation Patient Details Name: [name] MRN: [id number] DOB: Jun 17, 1941 Today's Date: 06/27/2021    History of Present Illness patient is a 80 year old female who presented to the hosptial on 8/1 after a fall resulting in increasd leg pain. patient was admitted for severe sepsis due to LLE cellulitis, hyperkalemia, and acute kidney injury. IRS:WNIOEVO, depression, renal cystitis/ulcer,osteoarthrits, and LE wounds   Clinical Impression   Patient is an 80 year old female who was admitted for above. Patient was independent for ADLs PLOF. Currently patient is mod A for supine to sit with extended increased time to participate. Patient was noted to have decreased activity tolerance, decreased sitting balance, and increased pain impacting ability to participate in ADLs. Patient would continue to benefit from skilled OT services at this time while admitted and after d/c to address noted deficits in order to improve overall safety and independence in ADLs.      Follow Up Recommendations  SNF    Equipment Recommendations  Tub/shower seat    Recommendations for Other Services       Precautions / Restrictions Precautions Precautions: Fall Precaution Comments: bilateral Unna boots, monitor HR Restrictions Weight Bearing Restrictions: No      Mobility Bed Mobility Overal bed mobility: Needs Assistance Bed Mobility: Supine to Sit     Supine to sit: Mod assist;HOB elevated     General bed mobility comments: patient was noted to take increased time to move LLE herself from middle of bed to L side of bed. patient was resistive to all assistance to move LLE to edge of bed. patient had to be redirectd back to task constantly during session. patient was easily distracted during session.    Transfers                 General transfer comment: unable to transfer with increased drainage from LLE noted on floor and bandages. nursing to address.    Balance  Overall balance assessment: Needs assistance   Sitting balance-Leahy Scale: Poor                                     ADL either performed or assessed with clinical judgement   ADL Overall ADL's : Needs assistance/impaired Eating/Feeding: Bed level;Set up Eating/Feeding Details (indicate cue type and reason): patient had to be repositioned in bed x2 with patients increased and frequent movements in bed. Grooming: Therapist, nutritional;Wash/dry hands;Oral care;Bed level;Minimal assistance Grooming Details (indicate cue type and reason): increased time to complete all tasks with cues to attend to task at hand as well. Upper Body Bathing: Moderate assistance;Bed level   Lower Body Bathing: Total assistance;Bed level   Upper Body Dressing : Moderate assistance;Bed level   Lower Body Dressing: Bed level;Total assistance     Toilet Transfer Details (indicate cue type and reason): unable to transfer with patient noted to have increased red drainage from LLE when it was placed in dependent position to attempt sitting up on edge of bed. session was stopped and transitioned back to supine with nursing made aware. Toileting- Clothing Manipulation and Hygiene: Bed level;Total assistance               Vision Patient Visual Report: No change from baseline       Perception     Praxis      Pertinent Vitals/Pain Pain Assessment: Faces Faces Pain Scale: Hurts even more Pain Location: LLE and  left heel. patient would yell with thought of movement and touching leg. Pain Intervention(s): Limited activity within patient's tolerance;Monitored during session;Repositioned     Hand Dominance Right   Extremity/Trunk Assessment Upper Extremity Assessment Upper Extremity Assessment: Overall WFL for tasks assessed   Lower Extremity Assessment Lower Extremity Assessment: Defer to PT evaluation   Cervical / Trunk Assessment Cervical / Trunk Assessment: Normal   Communication  Communication Communication: HOH   Cognition Arousal/Alertness: Awake/alert Behavior During Therapy: Anxious;Impulsive Overall Cognitive Status: Difficult to assess                                 General Comments: patient was noted to require increased cues for attending to task. patient was noted to be anxiously moving in bed with increased volume to communicate with patient with hearing aid in place. patient had to be educated on safety multiple times and redirected to task at hand. patient takes increased time to complete all tasks.   General Comments       Exercises     Shoulder Instructions      Home Living Family/patient expects to be discharged to:: Private residence (information about house was limited from patient during this session.) Living Arrangements: Spouse/significant other patient had a hard time attending to questions about home set up during session. Patient's home set up will need to be further explored during next session if patient is a little less anxious.                            Home Equipment: Walker - 4 wheels          Prior Functioning/Environment Level of Independence: Independent with assistive device(s)        Comments: patient reported being able to live at home with her "perfect walker" ( rollator) and get around the house. patient reported no longer driving at this time. patient reported being independent in bathing,dressing and toileting tasks at home.        OT Problem List: Decreased strength;Decreased cognition;Impaired balance (sitting and/or standing);Decreased safety awareness;Decreased activity tolerance;Decreased coordination;Increased edema;Pain      OT Treatment/Interventions: Self-care/ADL training;DME and/or AE instruction;Therapeutic activities;Balance training;Therapeutic exercise;Energy conservation;Patient/family education    OT Goals(Current goals can be found in the care plan section) Acute  Rehab OT Goals Patient Stated Goal: wants LLE to stop hurting OT Goal Formulation: With patient Time For Goal Achievement: 07/11/21 Potential to Achieve Goals: Fair  OT Frequency: Min 2X/week   Barriers to D/C:    patient lives at home with husband who also has health issues.       Co-evaluation              AM-PAC OT "6 Clicks" Daily Activity     Outcome Measure Help from another person eating meals?: A Little Help from another person taking care of personal grooming?: A Little Help from another person toileting, which includes using toliet, bedpan, or urinal?: Total Help from another person bathing (including washing, rinsing, drying)?: A Lot Help from another person to put on and taking off regular upper body clothing?: A Little Help from another person to put on and taking off regular lower body clothing?: A Lot 6 Click Score: 14   End of Session Nurse Communication: Other (comment) (cleared patient to participate and updated on participation in session)  Activity Tolerance: Other (comment) (patient was noted to have  increased drainage from LLE) Patient left:    OT Visit Diagnosis: Muscle weakness (generalized) (M62.81);Pain;History of falling (Z91.81) Pain - Right/Left: Left Pain - part of body: Leg                Time: 1610-9604 OT Time Calculation (min): 46 min Charges:  OT General Charges $OT Visit: 1 Visit OT Evaluation $OT Eval Low Complexity: 1 Low OT Treatments $Self Care/Home Management : 23-37 mins  Sharyn Blitz OTR/L, MS Acute Rehabilitation Department Office# [phone] Pager# [phone]   Chalmers Guest Fraya Ueda 06/27/2021, 12:59 PM

## 2021-06-28 DIAGNOSIS — R6520 Severe sepsis without septic shock: Secondary | ICD-10-CM

## 2021-06-28 DIAGNOSIS — N179 Acute kidney failure, unspecified: Secondary | ICD-10-CM

## 2021-06-28 DIAGNOSIS — L03116 Cellulitis of left lower limb: Secondary | ICD-10-CM | POA: Diagnosis not present

## 2021-06-28 DIAGNOSIS — A419 Sepsis, unspecified organism: Principal | ICD-10-CM

## 2021-06-28 LAB — GLUCOSE, CAPILLARY
Glucose-Capillary: 109 mg/dL — ABNORMAL HIGH (ref 70–99)
Glucose-Capillary: 87 mg/dL (ref 70–99)
Glucose-Capillary: 92 mg/dL (ref 70–99)
Glucose-Capillary: 95 mg/dL (ref 70–99)
Glucose-Capillary: 96 mg/dL (ref 70–99)
Glucose-Capillary: 98 mg/dL (ref 70–99)

## 2021-06-28 LAB — CK: Total CK: 247 U/L — ABNORMAL HIGH (ref 38–234)

## 2021-06-28 LAB — COMPREHENSIVE METABOLIC PANEL
ALT: 37 U/L (ref 0–44)
AST: 42 U/L — ABNORMAL HIGH (ref 15–41)
Albumin: 2.3 g/dL — ABNORMAL LOW (ref 3.5–5.0)
Alkaline Phosphatase: 62 U/L (ref 38–126)
Anion gap: 6 (ref 5–15)
BUN: 23 mg/dL (ref 8–23)
CO2: 20 mmol/L — ABNORMAL LOW (ref 22–32)
Calcium: 8.8 mg/dL — ABNORMAL LOW (ref 8.9–10.3)
Chloride: 115 mmol/L — ABNORMAL HIGH (ref 98–111)
Creatinine, Ser: 0.99 mg/dL (ref 0.44–1.00)
GFR, Estimated: 58 mL/min — ABNORMAL LOW (ref >60)
Glucose, Bld: 98 mg/dL (ref 70–99)
Potassium: 3.7 mmol/L (ref 3.5–5.1)
Sodium: 141 mmol/L (ref 135–145)
Total Bilirubin: 0.6 mg/dL (ref 0.3–1.2)
Total Protein: 5.4 g/dL — ABNORMAL LOW (ref 6.5–8.1)

## 2021-06-28 LAB — CBC
HCT: 37.1 % (ref 36.0–46.0)
Hemoglobin: 11.7 g/dL — ABNORMAL LOW (ref 12.0–15.0)
MCH: 32.1 pg (ref 26.0–34.0)
MCHC: 31.5 g/dL (ref 30.0–36.0)
MCV: 101.6 fL — ABNORMAL HIGH (ref 80.0–100.0)
Platelets: 288 10*3/uL (ref 150–400)
RBC: 3.65 MIL/uL — ABNORMAL LOW (ref 3.87–5.11)
RDW: 13.2 % (ref 11.5–15.5)
WBC: 10.6 10*3/uL — ABNORMAL HIGH (ref 4.0–10.5)
nRBC: 0 % (ref 0.0–0.2)

## 2021-06-28 LAB — AEROBIC CULTURE W GRAM STAIN (SUPERFICIAL SPECIMEN)

## 2021-06-28 LAB — MAGNESIUM: Magnesium: 1.9 mg/dL (ref 1.7–2.4)

## 2021-06-28 LAB — PHOSPHORUS: Phosphorus: 1.8 mg/dL — ABNORMAL LOW (ref 2.5–4.6)

## 2021-06-28 MED ORDER — HYDROMORPHONE HCL 1 MG/ML IJ SOLN
0.5000 mg | Freq: Two times a day (BID) | INTRAMUSCULAR | Status: DC | PRN
  Administered 2021-06-28 – 2021-06-30 (×3): 0.5 mg via INTRAVENOUS
  Filled 2021-06-28 (×3): qty 0.5

## 2021-06-28 MED ORDER — CEFDINIR 300 MG PO CAPS
300.0000 mg | ORAL_CAPSULE | Freq: Two times a day (BID) | ORAL | Status: DC
  Administered 2021-06-28 – 2021-07-02 (×8): 300 mg via ORAL
  Filled 2021-06-28 (×8): qty 1

## 2021-06-28 MED ORDER — LINEZOLID 600 MG PO TABS
600.0000 mg | ORAL_TABLET | Freq: Two times a day (BID) | ORAL | Status: DC
  Administered 2021-06-28 – 2021-07-02 (×8): 600 mg via ORAL
  Filled 2021-06-28 (×8): qty 1

## 2021-06-28 NOTE — Care Management Important Message (Signed)
Important Message  Patient Details IM Letter given to the Patient. Name: [name] MRN: [id number] Date of Birth: 1941/10/08   Medicare Important Message Given:  Yes     Caren Macadam 06/28/2021, 10:50 AM

## 2021-06-28 NOTE — Consult Note (Signed)
[facility]    Date of Admission:  06/24/2021     Reason for Consult:cellulitis     Referring Physician: Dr Alanda Slim  Current antibiotics: Ceftriaxone and vancomycin 8/1-pres  ASSESSMENT:    Left lower extremity cellulitis: Patient presented with severe sepsis in the setting of venous stasis ulcer and recently being started on doxycycline at her wound clinic.  She presented with significant leukocytosis, tachycardia, lactic acidosis and AKI.  Superficial wound cultures were obtained which has shown polymicrobial growth of MRSA, Achromobacter, and an unidentified GPC at this point.  She has continued to improve on vancomycin and ceftriaxone.  It is difficult to discern the significance of the superficial wound cultures in particular at the significance of the Achromobacter given the wounds exposure to the environment. Acute kidney injury: Creatinine improving.  PLAN:    Transition antibiotics to cefdinir 300 mg twice daily and linezolid 600 mg twice daily.  She is on low-dose Wellbutrin, but discussed with pharmacy and feel comfortable with this plan. Could consider vascular studies to determine if she has adequate blood flow to her distal extremities.  Will give her 7 more days of antibiotics through 07/05/2021 Continue wound care Will sign off, please call with any questions   Active Problems:   Chronic low back pain   Polyneuropathy   Severe sepsis (HCC)   Cellulitis of left lower extremity   Chronic major depressive disorder   Chronic renal impairment, stage 3 (moderate) (HCC)   AKI (acute kidney injury) (HCC)   MEDICATIONS:    Scheduled Meds: . (feeding supplement) PROSource Plus  30 mL Oral BID BM  . bethanechol  10 mg Oral TID  . buPROPion  150 mg Oral Daily  . cefdinir  300 mg Oral Q12H  . chlorhexidine  15 mL Mouth Rinse BID  . Chlorhexidine Gluconate Cloth  6 each Topical Daily  . feeding supplement  237 mL Oral Q24H  . gabapentin  200  mg Oral TID  . heparin  5,000 Units Subcutaneous Q8H  . linezolid  600 mg Oral Q12H  . mouth rinse  15 mL Mouth Rinse q12n4p  . mometasone-formoterol  2 puff Inhalation BID  . multivitamin with minerals  1 tablet Oral Daily  . mupirocin ointment  1 application Nasal BID  . pantoprazole  40 mg Oral Daily  . silver sulfADIAZINE   Topical BID  . theophylline  400 mg Oral Daily   Continuous Infusions: PRN Meds:.acetaminophen **OR** acetaminophen, diphenhydrAMINE, hydrALAZINE, levalbuterol, nystatin, ondansetron **OR** ondansetron (ZOFRAN) IV, oxyCODONE, polyethylene glycol  HPI:    [name] is a 80 y.o. female with a past medical history of venous stasis ulcer currently followed at the wound clinic, CKD, asthma/COPD, neuropathy, hypertension, OA, depression, RLS, diminished hearing, GERD who presented 4 days ago with generalized weakness, fall at home, increased leg pain, ulceration, and drainage from her known venous stasis ulcer.  There was confirmed concern for severe sepsis due to cellulitis.  She was also found to have an acute kidney injury with hyperkalemia and urinary retention.  She was started on antibiotics with ceftriaxone and vancomycin.  Wound care has been following and leaving recommendations.  Overall, it is felt that she is improving.  Her blood cultures are no growth to date.  Urine culture showed multiple species.  She did have a superficial wound culture from the left leg that grew MRSA as well as Achromobacter.  An additional wound culture superficial from her foot is showing  few GPC's.  She has been evaluated by therapy who is recommending SNF placement at discharge.  We have been consulted for further antibiotic recommendations.  When I attempted to see the patient, she was on the phone and did not engage with me to provide further subjective details so much of this history was obtained via chart review.   Past Medical History:  Diagnosis Date  . Arthritis   . Asthma    . Chronic renal impairment, stage 3 (moderate) (HCC)   . Depression   . GERD (gastroesophageal reflux disease)   . Hearing loss   . Hypertension   . Neck pain   . Numbness   . Restless legs   . Sciatica     Social History   Tobacco Use  . Smoking status: Former    Types: Cigarettes    Quit date: 1999    Years since quitting: 23.6  . Smokeless tobacco: Never  Vaping Use  . Vaping Use: Never used  Substance Use Topics  . Alcohol use: Yes    Comment: 2 drinks per day  . Drug use: No    Family History  Problem Relation Age of Onset  . Stroke Mother   . Hypertension Father   . Heart disease Father   . Heart attack Father   . Cancer Sister        unsure of type    Allergies  Allergen Reactions  . Meloxicam Rash    Review of Systems  Unable to perform ROS: Other   OBJECTIVE:   Blood pressure 140/74, pulse 98, temperature 98 F (36.7 C), temperature source Oral, resp. rate 16, height 5\' 4"  (1.626 m), weight 93.7 kg, SpO2 95 %. Body mass index is 35.46 kg/m.  Physical Exam Constitutional:      General: She is not in acute distress.    Appearance: Normal appearance.     Comments: Patient is having a conversation on the phone and did not respond to me saying "Hello Ms Logan"  HENT:     Head: Normocephalic and atraumatic.  Pulmonary:     Effort: Pulmonary effort is normal. No respiratory distress.  Skin:    Comments: Left lower extremities wrapped in clean gauze.  Images reviewed in media tab.   Neurological:     General: No focal deficit present.     Mental Status: She is alert.     Cranial Nerves: No cranial nerve deficit.           Lab Results: Lab Results  Component Value Date   WBC 10.6 (H) 06/28/2021   HGB 11.7 (L) 06/28/2021   HCT 37.1 06/28/2021   MCV 101.6 (H) 06/28/2021   PLT 288 06/28/2021    Lab Results  Component Value Date   NA 141 06/28/2021   K 3.7 06/28/2021   CO2 20 (L) 06/28/2021   GLUCOSE 98 06/28/2021   BUN 23  06/28/2021   CREATININE 0.99 06/28/2021   CALCIUM 8.8 (L) 06/28/2021   GFRNONAA 58 (L) 06/28/2021    Lab Results  Component Value Date   ALT 37 06/28/2021   AST 42 (H) 06/28/2021   ALKPHOS 62 06/28/2021   BILITOT 0.6 06/28/2021       Component Value Date/Time   CRP 3.9 08/28/2017 1129    No results found for: ESRSEDRATE  I have reviewed the micro and lab results in Epic.  Imaging: No results found.   Imaging independently reviewed in Epic.  10/28/2017 [facility] [phone] pager 06/28/2021, 3:24 PM

## 2021-06-28 NOTE — TOC Progression Note (Signed)
Transition of Care [facility]) - Progression Note    Patient Details  Name: Mischa Brittingham MRN: [id number] Date of Birth: 02-Oct-1941  Transition of Care [facility]) CM/SW Contact  Lennart Pall, LCSW Phone Number: 06/28/2021, 5:04 PM  Clinical Narrative:    Met with pt and spouse this afternoon to discuss PT recommendation for SNF rehab.  They are aware and agreeable with this plan. Spouse notes primary goal is for pt to be back at baseline which is independent with rolling walker (rollator) in the home.  No facility preference.  Will begin bed search and ask oncoming TOC coverage to review any bed offers.   Expected Discharge Plan: [place] Barriers to Discharge: Continued Medical Work up  Expected Discharge Plan and Services Expected Discharge Plan: [place]   Discharge Planning Services: CM Consult   Living arrangements for the past 2 months: Single Family Home                                       Social Determinants of Health (SDOH) Interventions    Readmission Risk Interventions No flowsheet data found.

## 2021-06-28 NOTE — Progress Notes (Signed)
PROGRESS NOTE  [name] JWJ:[id number]:[id number] DOB: [birth year]   PCP: Gwenlyn FoundEksir, Samantha A, MD  Patient is from: Home  DOA: 06/24/2021 LOS: 4  Chief complaints:  Chief Complaint  Patient presents with   Fall   Weakness     Brief Narrative / Interim history: 80 year old M with PMH of renal cystitis/ulcer followed at wound clinic, CKD-3B, asthma/COPD, neuropathy, HTN, osteoarthritis, anxiety, depression, RLS, diminished hearing and GERD presenting with generalized weakness, fall at home, increased leg pain, ulceration and drainage, and admitted for severe sepsis due to LLE cellulitis, AKI, hyperkalemia and acute urinary retention.  Cultures obtained.  Patient was started on IV ceftriaxone, IV vancomycin and IV fluid.  Wound care consulted.   Patient is improving.  Blood culture NGTD.  Urine culture with multiple species.  Superficial wound culture from left leg with MRSA and Achromobacter xylosoxidans.  Superficial wound culture from foot with few GPC's.  Remains on IV fluid, CTX and vancomycin.  Infectious disease consulted.  Therapy recommended SNF.  Subjective: Seen and examined earlier this morning.  No major events overnight of this morning.  AKI seems to have resolved.  Neuropathic pain improved with gabapentin.  No complaints other than itching in her back.  Objective: Vitals:   06/28/21 0500 06/28/21 0504 06/28/21 0640 06/28/21 1235  BP:  133/70  140/74  Pulse:  (!) 103  98  Resp:  14  16  Temp:  97.9 F (36.6 C)  98 F (36.7 C)  TempSrc:  Oral  Oral  SpO2:  96%  95%  Weight: 93.6 kg  93.7 kg   Height:        Intake/Output Summary (Last 24 hours) at 06/28/2021 1515 Last data filed at 06/28/2021 0635 Gross per 24 hour  Intake 411.08 ml  Output 900 ml  Net -488.92 ml   Filed Weights   06/27/21 0500 06/28/21 0500 06/28/21 0640  Weight: 91.9 kg 93.6 kg 93.7 kg    Examination:   GENERAL: No apparent distress.  Nontoxic. HEENT: MMM.  Vision grossly intact.  Diminished  hearing. NECK: Supple.  No apparent JVD.  RESP: 96% on RA.  No IWOB.  Fair aeration bilaterally. CVS:  RRR. Heart sounds normal.  ABD/GI/GU: BS+. Abd soft, NTND.  MSK/EXT:  Moves extremities.  Some dependent around left elbow from extravasation.  Ace wrap over BLE. SKIN: Ace wrap over BLE. NEURO: Awake and alert. Oriented appropriately.  No apparent focal neuro deficit. PSYCH: Calm. Normal affect.   Procedures:  None  Microbiology summarized: COVID-19 and influenza PCR nonreactive. MRSA PCR screen positive. Blood cultures NGTD. Urine culture with multiple species. Superficial wound culture from left foot with GPC's Superficial wound culture from left leg with MRSA and Achromobacter xylosoxidans.   Assessment & Plan: Severe sepsis in the setting of LLE cellulitis: POA.  Recently started on p.o. doxycycline at wound clinic. Had significant leukocytosis with tachycardia, lactic acidosis and AKI on presentation.   MRSA PCR screen positive.  Sepsis physiology improved.  Tachycardia could be due to her theophylline but improved.  No osseous abnormality on x-ray.  Cultures as above. -Continue Vanco and CTX -ID consulted for guidance on antibiotics. -Continue wound care  AKI/azotemia: thought to have underlying CKD 3B which might not be the case.  AKI likely due to sepsis, diuretics, ARB, NSAID use and rhabdo.  Improving. Recent Labs    06/24/21 1559 06/24/21 2206 06/25/21 0243 06/26/21 0242 06/27/21 0457 06/28/21 0442  BUN 98* 80* 73* 47* 29* 23  CREATININE 4.66*  3.83* 3.19* 1.73* 1.22* 0.99  -Renal US negative. -Avoid or minimize nephrotoxic meds -Continue monitoring of IV fluid  Elevated anion gap metabolic acidosis: Likely due to azotemia.  Improving. -Check with renal panel.  Generalized weakness/fall at home/traumatic rhabdomyolysis: CK elevated to 1100> 643> 247.  CT head without acute finding. No significant injury.  -PT/OT eval-recommended SNF.   Acute urinary  retention-reportedly had about 628 cc on bladder scan prevoid and Foley catheter was inserted.  Renal US without acute finding.  Failed voiding trial on 8/2. -Continue bethanechol -Voiding trial in the next 1 to 2 days once she start getting out of the bed  Hypokalemia/hypophosphatemia -IV potassium phosphate 30 mmol x 1   Hyperkalemia: Resolved.  Hyponatremia: Resolved.   Elevated AST: Likely due to rhabdo.  Resolving.   Neuropathic pain-continues to endorse significant pain. -Increased gabapentin from 200 mg twice daily to 3 times daily on 8/4.  This seems to be effective.   Sinus tachycardia/hypertension: Normotensive.  HR slightly elevated probably from Theophylline. -Continue holding Lasix and losartan   GERD -Continue Protonix  Depression and Anxiety: Stable -Continue bupropion 150 mg p.o. daily  Asthma and COPD: Stable. -Continue with theophylline and Advair substitution -Continue monitor respiratory status carefully and if necessary will place on nebs  Obesity Body mass index is 35.46 kg/m. Nutrition Problem: Increased nutrient needs Etiology: acute illness, wound healing Signs/Symptoms: estimated needs Interventions: MVI, Ensure Enlive (each supplement provides 350kcal and 20 grams of protein), Prostat   DVT prophylaxis:  heparin injection 5,000 Units Start: 06/25/21 0600  Code Status: DNR/DNI Family Communication: Patient and/or RN. Available if any question.  Level of care: Progressive.  Status is: Inpatient  Remains inpatient appropriate because:Unsafe d/c plan, IV treatments appropriate due to intensity of illness or inability to take PO, and Inpatient level of care appropriate due to severity of illness  Dispo: The patient is from: Home              Anticipated d/c is to:  SNF              Patient currently is not medically stable to d/c.   Difficult to place patient No       Consultants:  Infectious disease   Sch Meds:  Scheduled Meds:   (feeding supplement) PROSource Plus  30 mL Oral BID BM   bethanechol  10 mg Oral TID   buPROPion  150 mg Oral Daily   cefdinir  300 mg Oral Q12H   chlorhexidine  15 mL Mouth Rinse BID   Chlorhexidine Gluconate Cloth  6 each Topical Daily   feeding supplement  237 mL Oral Q24H   gabapentin  200 mg Oral TID   heparin  5,000 Units Subcutaneous Q8H   linezolid  600 mg Oral Q12H   mouth rinse  15 mL Mouth Rinse q12n4p   mometasone-formoterol  2 puff Inhalation BID   multivitamin with minerals  1 tablet Oral Daily   mupirocin ointment  1 application Nasal BID   pantoprazole  40 mg Oral Daily   silver sulfADIAZINE   Topical BID   theophylline  400 mg Oral Daily   Continuous Infusions:   PRN Meds:.acetaminophen **OR** acetaminophen, diphenhydrAMINE, hydrALAZINE, levalbuterol, nystatin, ondansetron **OR** ondansetron (ZOFRAN) IV, oxyCODONE, polyethylene glycol  Antimicrobials: Anti-infectives (From admission, onward)    Start     Dose/Rate Route Frequency Ordered Stop   06/28/21 2200  linezolid (ZYVOX) tablet 600 mg        600 mg Oral  Every 12 hours 06/28/21 1511 07/06/21 0959   06/28/21 1600  cefdinir (OMNICEF) capsule 300 mg        300 mg Oral Every 12 hours 06/28/21 1511 07/06/21 0959   06/27/21 1800  vancomycin (VANCOREADY) IVPB 750 mg/150 mL  Status:  Discontinued        750 mg 150 mL/hr over 60 Minutes Intravenous Every 24 hours 06/27/21 0811 06/28/21 1511   06/26/21 1800  vancomycin (VANCOREADY) IVPB 500 mg/100 mL  Status:  Discontinued        500 mg 100 mL/hr over 60 Minutes Intravenous Every 48 hours 06/24/21 1917 06/26/21 0836   06/26/21 1800  vancomycin (VANCOCIN) IVPB 1000 mg/200 mL premix  Status:  Discontinued        1,000 mg 200 mL/hr over 60 Minutes Intravenous Every 48 hours 06/26/21 0836 06/27/21 0811   06/25/21 1600  cefTRIAXone (ROCEPHIN) 2 g in sodium chloride 0.9 % 100 mL IVPB  Status:  Discontinued        2 g 200 mL/hr over 30 Minutes Intravenous Every 24 hours  06/24/21 2059 06/28/21 1511   06/24/21 2245  cefTRIAXone (ROCEPHIN) 2 g in sodium chloride 0.9 % 100 mL IVPB  Status:  Discontinued        2 g 200 mL/hr over 30 Minutes Intravenous  Once 06/24/21 2145 06/24/21 2149   06/24/21 2245  cefTRIAXone (ROCEPHIN) 2 g in sodium chloride 0.9 % 100 mL IVPB  Status:  Discontinued        2 g 200 mL/hr over 30 Minutes Intravenous Every 24 hours 06/24/21 2145 06/24/21 2149   06/24/21 2100  cefTRIAXone (ROCEPHIN) 2 g in sodium chloride 0.9 % 100 mL IVPB  Status:  Discontinued        2 g 200 mL/hr over 30 Minutes Intravenous Every 24 hours 06/24/21 2059 06/24/21 2100   06/24/21 1530  vancomycin (VANCOCIN) IVPB 1000 mg/200 mL premix        1,000 mg 200 mL/hr over 60 Minutes Intravenous  Once 06/24/21 1520 06/24/21 1854   06/24/21 1530  cefTRIAXone (ROCEPHIN) 2 g in sodium chloride 0.9 % 100 mL IVPB        2 g 200 mL/hr over 30 Minutes Intravenous  Once 06/24/21 1520 06/24/21 1650        I have personally reviewed the following labs and images: CBC: Recent Labs  Lab 06/24/21 1559 06/24/21 2206 06/25/21 0243 06/26/21 0242 06/27/21 0457 06/28/21 0442  WBC 22.0* 18.0* 15.6* 10.3 9.2 10.6*  NEUTROABS 19.3*  --   --  7.4  --   --   HGB 14.8 13.1 12.8 12.1 11.4* 11.7*  HCT 44.6 41.0 38.1 38.1 35.3* 37.1  MCV 99.3 101.5* 98.2 102.1* 101.4* 101.6*  PLT 414* 341 303 297 295 288   BMP &GFR Recent Labs  Lab 06/24/21 2206 06/25/21 0243 06/26/21 0242 06/27/21 0457 06/28/21 0442  NA 134* 137 138 139 141  K 4.3 4.2 3.2* 4.0 3.7  CL 98 105 110 113* 115*  CO2 20* 20* 17* 20* 20*  GLUCOSE 100* 90 82 102* 98  BUN 80* 73* 47* 29* 23  CREATININE 3.83* 3.19* 1.73* 1.22* 0.99  CALCIUM 9.2 9.1 8.8* 8.8* 8.8*  MG 2.6*  --  2.2 2.0 1.9  PHOS 5.9*  --  3.6 1.4* 1.8*   Estimated Creatinine Clearance: 50.3 mL/min (by C-G formula based on SCr of 0.99 mg/dL). Liver & Pancreas: Recent Labs  Lab 06/24/21 1559 06/24/21 2206 06/25/21 0243 06/26/21  4098  06/27/21 0457 06/28/21 0442  AST 52* 61* 65* 60*  --  42*  ALT 33  --  37  ALKPHOS 103 79 74 75  --  62  BILITOT 0.7 0.8 0.7 0.8  --  0.6  PROT 8.0 6.6 6.4* 5.9*  --  5.4*  ALBUMIN 3.2* 2.8* 2.5* 2.4* 2.2* 2.3*   No results for input(s): LIPASE, AMYLASE in the last 168 hours. Recent Labs  Lab 06/26/21 1442  AMMONIA 17   Diabetic: No results for input(s): HGBA1C in the last 72 hours.  Recent Labs  Lab 06/27/21 2239 06/28/21 0035 06/28/21 0507 06/28/21 0734 06/28/21 1233  GLUCAP 100* 95 96 92 109*   Cardiac Enzymes: Recent Labs  Lab 06/25/21 0728 06/26/21 0242 06/27/21 0457 06/28/21 0442  CKTOTAL 1,100* 643* 402* 247*   No results for input(s): PROBNP in the last 8760 hours. Coagulation Profile: Recent Labs  Lab 06/24/21 1559  INR 1.0   Thyroid Function Tests: Recent Labs    06/26/21 0242  TSH 3.983   Lipid Profile: No results for input(s): CHOL, HDL, LDLCALC, TRIG, CHOLHDL, LDLDIRECT in the last 72 hours. Anemia Panel: Recent Labs    06/26/21 1442  VITAMINB12 1,180*   Urine analysis:    Component Value Date/Time   COLORURINE YELLOW 06/24/2021 2100   APPEARANCEUR HAZY (A) 06/24/2021 2100   LABSPEC 1.017 06/24/2021 2100   PHURINE 6.0 06/24/2021 2100   GLUCOSEU NEGATIVE 06/24/2021 2100   HGBUR MODERATE (A) 06/24/2021 2100   BILIRUBINUR NEGATIVE 06/24/2021 2100   KETONESUR NEGATIVE 06/24/2021 2100   PROTEINUR 100 (A) 06/24/2021 2100   NITRITE NEGATIVE 06/24/2021 2100   LEUKOCYTESUR MODERATE (A) 06/24/2021 2100   Sepsis Labs: Invalid input(s): PROCALCITONIN, LACTICIDVEN  Microbiology: Recent Results (from the past 240 hour(s))  Blood Culture (routine x 2)     Status: None (Preliminary result)   Collection Time: 06/24/21  3:48 PM   Specimen: BLOOD  Result Value Ref Range Status   Specimen Description   Final    BLOOD LEFT ANTECUBITAL Performed at [facility], [address] [place], [place] [address]     Special Requests   Final    BOTTLES DRAWN AEROBIC AND ANAEROBIC Blood Culture adequate volume Performed at [facility], [address], [place], [place] [address]    Culture   Final    NO GROWTH 4 DAYS Performed at [facility], [address] [place], [place] [address]    Report Status PENDING  Incomplete  Blood Culture (routine x 2)     Status: None (Preliminary result)   Collection Time: 06/24/21  3:48 PM   Specimen: BLOOD  Result Value Ref Range Status   Specimen Description   Final    BLOOD RIGHT ANTECUBITAL Performed at [facility], [address], [place], [place] [address]    Special Requests   Final    BOTTLES DRAWN AEROBIC AND ANAEROBIC Blood Culture adequate volume Performed at [facility], [address] [place], [place] [address]    Culture   Final    NO GROWTH 4 DAYS Performed at [facility], [address] [place], [place] [address]    Report Status PENDING  Incomplete  Resp Panel by RT-PCR (Flu A&B, Covid) Nasopharyngeal Swab     Status: None   Collection Time: 06/24/21  3:51 PM   Specimen: Nasopharyngeal Swab; Nasopharyngeal(NP) swabs in vial transport medium  Result Value  Ref Range Status   SARS Coronavirus 2 by RT PCR NEGATIVE NEGATIVE Final    Comment: (NOTE) SARS-CoV-2 target nucleic acids are NOT DETECTED.  The SARS-CoV-2 RNA is generally detectable in upper respiratory specimens during the acute phase of infection. The lowest concentration of SARS-CoV-2 viral copies this assay can detect is 138 copies/mL. A negative result does not preclude SARS-Cov-2 infection and should not be used as the sole basis for treatment or other patient management decisions. A negative result may occur with  improper specimen collection/handling, submission of specimen other than nasopharyngeal swab, presence of viral mutation(s) within the areas targeted by this assay, and inadequate number of  viral copies(<138 copies/mL). A negative result must be combined with clinical observations, patient history, and epidemiological information. The expected result is Negative.  Fact Sheet for Patients:  BloggerCourse.com  Fact Sheet for Healthcare Providers:  SeriousBroker.it  This test is no t yet approved or cleared by the [place] FDA and  has been authorized for detection and/or diagnosis of SARS-CoV-2 by FDA under an Emergency Use Authorization (EUA). This EUA will remain  in effect (meaning this test can be used) for the duration of the COVID-19 declaration under Section 564(b)(1) of the Act, 21 U.S.C.section 360bbb-3(b)(1), unless the authorization is terminated  or revoked sooner.       Influenza A by PCR NEGATIVE NEGATIVE Final   Influenza B by PCR NEGATIVE NEGATIVE Final    Comment: (NOTE) The Xpert Xpress SARS-CoV-2/FLU/RSV plus assay is intended as an aid in the diagnosis of influenza from Nasopharyngeal swab specimens and should not be used as a sole basis for treatment. Nasal washings and aspirates are unacceptable for Xpert Xpress SARS-CoV-2/FLU/RSV testing.  Fact Sheet for Patients: BloggerCourse.com  Fact Sheet for Healthcare Providers: SeriousBroker.it  This test is not yet approved or cleared by the [place] FDA and has been authorized for detection and/or diagnosis of SARS-CoV-2 by FDA under an Emergency Use Authorization (EUA). This EUA will remain in effect (meaning this test can be used) for the duration of the COVID-19 declaration under Section 564(b)(1) of the Act, 21 U.S.C. section 360bbb-3(b)(1), unless the authorization is terminated or revoked.  Performed at [facility], [address], [place], [place] [address]   Urine Culture     Status: Abnormal   Collection Time: 06/24/21  9:00 PM   Specimen: In/Out Cath  Urine  Result Value Ref Range Status   Specimen Description   Final    IN/OUT CATH URINE Performed at [facility], [address] [place], [place] [address]    Special Requests   Final    NONE Performed at [facility], [address], [place], [place] [address]    Culture (A)  Final    >=100,000 COLONIES/mL MULTIPLE SPECIES PRESENT, SUGGEST RECOLLECTION   Report Status 06/26/2021 FINAL  Final  MRSA Next Gen by PCR, Nasal     Status: Abnormal   Collection Time: 06/24/21  9:47 PM   Specimen: Nasal Mucosa; Nasal Swab  Result Value Ref Range Status   MRSA by PCR Next Gen DETECTED (A) NOT DETECTED Final    Comment: RESULT CALLED TO, READ BACK BY AND VERIFIED WITH: RN S MORDUE AT 2351 06/24/21 CRUICKSHANK A (NOTE) The GeneXpert MRSA Assay (FDA approved for NASAL specimens only), is one component of a comprehensive MRSA colonization surveillance program. It is not intended to diagnose MRSA infection nor to guide or monitor treatment for MRSA infections. Test  performance is not FDA approved in patients less than 54 years old. Performed at [facility], [address], [place], [place] [address]   Aerobic Culture w Gram Stain (superficial specimen)     Status: None   Collection Time: 06/25/21  1:05 PM   Specimen: Leg  Result Value Ref Range Status   Specimen Description   Final    LEG LEFT Performed at [facility], [address], [place], [place] [address]    Special Requests   Final    NONE Performed at [facility], [address] [place], [place] [address]    Gram Stain   Final    RARE WBC PRESENT, PREDOMINANTLY PMN NO ORGANISMS SEEN Performed at [facility], [address] [place], [place] [address]    Culture   Final    FEW METHICILLIN RESISTANT STAPHYLOCOCCUS AUREUS RARE ACHROMOBACTER XYLOSOXIDANS    Report Status 06/28/2021 FINAL  Final   Organism ID, Bacteria  METHICILLIN RESISTANT STAPHYLOCOCCUS AUREUS  Final   Organism ID, Bacteria ACHROMOBACTER XYLOSOXIDANS  Final      Susceptibility   Methicillin resistant staphylococcus aureus - MIC*    CIPROFLOXACIN >=8 RESISTANT Resistant     ERYTHROMYCIN >=8 RESISTANT Resistant     GENTAMICIN <=0.5 SENSITIVE Sensitive     OXACILLIN >=4 RESISTANT Resistant     TETRACYCLINE <=1 SENSITIVE Sensitive     VANCOMYCIN <=0.5 SENSITIVE Sensitive     TRIMETH/SULFA <=10 SENSITIVE Sensitive     CLINDAMYCIN <=0.25 SENSITIVE Sensitive     RIFAMPIN <=0.5 SENSITIVE Sensitive     Inducible Clindamycin NEGATIVE Sensitive     * FEW METHICILLIN RESISTANT STAPHYLOCOCCUS AUREUS   Achromobacter xylosoxidans - MIC*    CEFAZOLIN >=64 RESISTANT Resistant     GENTAMICIN >=16 RESISTANT Resistant     CIPROFLOXACIN >=4 RESISTANT Resistant     IMIPENEM 2 SENSITIVE Sensitive     TRIMETH/SULFA <=20 SENSITIVE Sensitive     * RARE ACHROMOBACTER XYLOSOXIDANS  Aerobic Culture w Gram Stain (superficial specimen)     Status: None (Preliminary result)   Collection Time: 06/25/21  1:05 PM   Specimen: Toe  Result Value Ref Range Status   Specimen Description   Final    TOE LEFT INTERDIGITAL Performed at [facility], [address], [place], [place] [address]    Special Requests   Final    NONE Performed at [facility], [address], [place], [place] [address]    Gram Stain NO WBC SEEN FEW GRAM POSITIVE COCCI   Final   Culture   Final    CULTURE REINCUBATED FOR BETTER GROWTH Performed at [facility], [address] [place], [place] [address]    Report Status PENDING  Incomplete    Radiology Studies: No results found.     Gabriela Irigoyen T. Keelia Graybill [facility]  If 7PM-7AM, please contact night-coverage [email or web address] 06/28/2021, 3:15 PM

## 2021-06-28 NOTE — Progress Notes (Signed)
Dressing changes complete on BLE.  Patient's wounds are extremely painful.  The skin is very raw and bleeding a lot.  There is a large amount of white discharge present.  Dr. Alanda Slim notified and new orders for IV dilaudid with dressing changes placed.  Dr. Alanda Slim also notified of redness/swelling/rash on patient's bilateral upper extremities and back.  Patient's skin is in such poor condition it is difficult to tell what is baseline for her.  He is aware.  IV medication given in the middle of dressing changes.

## 2021-06-28 NOTE — Evaluation (Signed)
Physical Therapy Evaluation Patient Details Name: [name] MRN: [id number] DOB: 03/19/1941 Today's Date: 06/28/2021   History of Present Illness  patient is a 80 year old female who presented to the hosptial on 8/1 after a fall resulting in increasd leg pain. patient was admitted for severe sepsis due to LLE cellulitis, hyperkalemia, and acute kidney injury. OAC:[id number], depression, renal cystitis/ulcer,osteoarthrits, and LE wounds  Clinical Impression  Patient  noted in bed, very restless and anxious  when attempting to move the LLE. Patient is very HOH and communication is difficult. Unsure of  last ability to stand and ambulate. Patient indicates a  type of rollator(? The  upright with brakes).  Patient easily distracted, rambling words/tangential with difficulty understanding what patient is trying to communicate other than"Don't touch my leg." Patient did  allow therapists to assist  her to sit up onto edge of bed, moving  both legs on pillow/assist with trunk. Patient sat on bed edge x ~ 8 minutes with no assistance. + 2 total assist for back into bed.  Noted bleeding from lateral side of left foot.foot Pt admitted with above diagnosis.  Pt currently with functional limitations due to the deficits listed below (see PT Problem List). Pt will benefit from skilled PT to increase their independence and safety with mobility to allow discharge to the venue listed below.        Follow Up Recommendations SNF    Equipment Recommendations  None recommended by PT    Recommendations for Other Services       Precautions / Restrictions Precautions Precautions: Fall Precaution Comments: bilateral Unna boots,      Mobility  Bed Mobility   Bed Mobility: Supine to Sit;Sit to Supine     Supine to sit: +2 for safety/equipment;+2 for physical assistance;HOB elevated;Total assist Sit to supine: +2 for safety/equipment;+2 for physical assistance;Total assist   General bed mobility  comments: patient anxious about moving the LLE, constantly states"Don't touch that leg.", both legs moved while on pillow, total assist to lift trunk and scoot forward. Patient resisiting at times when LLE is moved, total of 2 to lift legs and assist with trunk. patient does assist pulling up in bed using rails.    Transfers                 General transfer comment: unable  Ambulation/Gait                Stairs            Wheelchair Mobility    Modified Rankin (Stroke Patients Only)       Balance Overall balance assessment: Needs assistance Sitting-balance support: Feet supported;No upper extremity supported Sitting balance-Leahy Scale: Fair Sitting balance - Comments: sits without support       Standing balance comment: NT                             Pertinent Vitals/Pain Faces Pain Scale: Hurts even more Pain Location: LLE and left heel. patient would yell with thought of movement and touching leg. Pain Descriptors / Indicators: Crying;Discomfort;Moaning Pain Intervention(s): Limited activity within patient's tolerance;Monitored during session    Home Living Family/patient expects to be discharged to:: Private residence Living Arrangements: Spouse/significant other             Home Equipment: Environmental consultant - 4 wheels Additional Comments: patient had a hard time attending to questions about home set up during session. Patient's home  set up will need to be further explored during next session if patient is a little less anxious.    Prior Function           Comments: Patient not clear when she was last able to ambulate- Unsure of level of care provided by  spouse     Hand Dominance        Extremity/Trunk Assessment   Upper Extremity Assessment Upper Extremity Assessment: Overall WFL for tasks assessed    Lower Extremity Assessment Lower Extremity Assessment: RLE deficits/detail;LLE deficits/detail RLE Deficits / Details:  dressing in place, noted sores on toes and feet LLE Deficits / Details: bleeding from Lateral side of foot when dependnent, dressing on leg does not move the leg, moans when toched and lifted    Cervical / Trunk Assessment Cervical / Trunk Assessment: Normal  Communication   Communication: HOH  Cognition                                       General Comments: patient was noted to require increased cues for attending to task. patient was noted to be anxiously moving in bed with increased volume to communicate with patient with hearing aid in place. patient constantly speaking, not always sensible, Difficult to follow directions? due to [facility], distracted.      General Comments      Exercises     Assessment/Plan    PT Assessment Patient needs continued PT services  PT Problem List Decreased strength;Decreased cognition;Decreased knowledge of precautions;Decreased range of motion;Decreased mobility;Decreased activity tolerance;Decreased skin integrity;Decreased safety awareness;Decreased knowledge of use of DME       PT Treatment Interventions Therapeutic activities;Therapeutic exercise;Patient/family education;Functional mobility training    PT Goals (Current goals can be found in the Care Plan section)  Acute Rehab PT Goals Patient Stated Goal: wants LLE to stop hurting PT Goal Formulation: Patient unable to participate in goal setting Time For Goal Achievement: 07/12/21 Potential to Achieve Goals: Fair    Frequency Min 2X/week   Barriers to discharge Decreased caregiver support      Co-evaluation               AM-PAC PT "6 Clicks" Mobility  Outcome Measure Help needed turning from your back to your side while in a flat bed without using bedrails?: Total Help needed moving from lying on your back to sitting on the side of a flat bed without using bedrails?: Total Help needed moving to and from a bed to a chair (including a wheelchair)?: Total Help  needed standing up from a chair using your arms (e.g., wheelchair or bedside chair)?: Total Help needed to walk in hospital room?: Total Help needed climbing 3-5 steps with a railing? : Total 6 Click Score: 6    End of Session   Activity Tolerance: Patient limited by pain Patient left: in bed;with call bell/phone within reach;with bed alarm set Nurse Communication: Mobility status PT Visit Diagnosis: Difficulty in walking, not elsewhere classified (R26.2)    Time: 2297-9892 PT Time Calculation (min) (ACUTE ONLY): 33 min   Charges:   PT Evaluation $PT Eval Moderate Complexity: 1 Mod PT Treatments $Therapeutic Activity: 8-22 mins        Blanchard Kelch PT Acute Rehabilitation Services Pager [phone] Office [phone]   Rada Hay 06/28/2021, 2:39 PM

## 2021-06-29 ENCOUNTER — Inpatient Hospital Stay (HOSPITAL_COMMUNITY): Payer: Medicare Other

## 2021-06-29 DIAGNOSIS — R21 Rash and other nonspecific skin eruption: Secondary | ICD-10-CM

## 2021-06-29 LAB — CBC
HCT: 35.9 % — ABNORMAL LOW (ref 36.0–46.0)
Hemoglobin: 11.6 g/dL — ABNORMAL LOW (ref 12.0–15.0)
MCH: 32.6 pg (ref 26.0–34.0)
MCHC: 32.3 g/dL (ref 30.0–36.0)
MCV: 100.8 fL — ABNORMAL HIGH (ref 80.0–100.0)
Platelets: 297 10*3/uL (ref 150–400)
RBC: 3.56 MIL/uL — ABNORMAL LOW (ref 3.87–5.11)
RDW: 13.4 % (ref 11.5–15.5)
WBC: 9.9 10*3/uL (ref 4.0–10.5)
nRBC: 0 % (ref 0.0–0.2)

## 2021-06-29 LAB — CULTURE, BLOOD (ROUTINE X 2)
Culture: NO GROWTH
Culture: NO GROWTH
Special Requests: ADEQUATE
Special Requests: ADEQUATE

## 2021-06-29 LAB — COMPREHENSIVE METABOLIC PANEL
ALT: 35 U/L (ref 0–44)
AST: 34 U/L (ref 15–41)
Albumin: 2.3 g/dL — ABNORMAL LOW (ref 3.5–5.0)
Alkaline Phosphatase: 61 U/L (ref 38–126)
Anion gap: 4 — ABNORMAL LOW (ref 5–15)
BUN: 19 mg/dL (ref 8–23)
CO2: 23 mmol/L (ref 22–32)
Calcium: 8.5 mg/dL — ABNORMAL LOW (ref 8.9–10.3)
Chloride: 111 mmol/L (ref 98–111)
Creatinine, Ser: 0.99 mg/dL (ref 0.44–1.00)
GFR, Estimated: 58 mL/min — ABNORMAL LOW (ref >60)
Glucose, Bld: 94 mg/dL (ref 70–99)
Potassium: 3.6 mmol/L (ref 3.5–5.1)
Sodium: 138 mmol/L (ref 135–145)
Total Bilirubin: 0.8 mg/dL (ref 0.3–1.2)
Total Protein: 5.4 g/dL — ABNORMAL LOW (ref 6.5–8.1)

## 2021-06-29 LAB — GLUCOSE, CAPILLARY
Glucose-Capillary: 105 mg/dL — ABNORMAL HIGH (ref 70–99)
Glucose-Capillary: 112 mg/dL — ABNORMAL HIGH (ref 70–99)
Glucose-Capillary: 126 mg/dL — ABNORMAL HIGH (ref 70–99)
Glucose-Capillary: 137 mg/dL — ABNORMAL HIGH (ref 70–99)
Glucose-Capillary: 152 mg/dL — ABNORMAL HIGH (ref 70–99)
Glucose-Capillary: 99 mg/dL (ref 70–99)

## 2021-06-29 LAB — MAGNESIUM: Magnesium: 2.1 mg/dL (ref 1.7–2.4)

## 2021-06-29 LAB — VITAMIN B1: Vitamin B1 (Thiamine): 189.2 nmol/L (ref 66.5–200.0)

## 2021-06-29 LAB — PHOSPHORUS: Phosphorus: 2.4 mg/dL — ABNORMAL LOW (ref 2.5–4.6)

## 2021-06-29 LAB — CK: Total CK: 190 U/L (ref 38–234)

## 2021-06-29 MED ORDER — NYSTATIN-TRIAMCINOLONE 100000-0.1 UNIT/GM-% EX CREA
TOPICAL_CREAM | Freq: Two times a day (BID) | CUTANEOUS | Status: DC
  Administered 2021-06-30 – 2021-07-01 (×2): 1 via TOPICAL
  Filled 2021-06-29: qty 30

## 2021-06-29 NOTE — Progress Notes (Signed)
PROGRESS NOTE  [name] XTG:[id number] DOB: 01-28-1941   PCP: Gwenlyn Found, MD  Patient is from: Home  DOA: 06/24/2021 LOS: 5  Chief complaints:  Chief Complaint  Patient presents with   Fall   Weakness     Brief Narrative / Interim history: 80 year old M with PMH of renal cystitis/ulcer followed at wound clinic, CKD-3B, asthma/COPD, neuropathy, HTN, osteoarthritis, anxiety, depression, RLS, diminished hearing and GERD presenting with generalized weakness, fall at home, increased leg pain, ulceration and drainage, and admitted for severe sepsis due to LLE cellulitis, AKI, hyperkalemia and acute urinary retention.  Cultures obtained.  Patient was started on IV ceftriaxone, IV vancomycin and IV fluid.  Wound care consulted.   Patient is improving.  Blood culture NGTD.  Urine culture with multiple species.  Superficial wound culture from left leg with MRSA and Achromobacter xylosoxidans.  Superficial wound culture from foot with few GPC's.  Remains on IV fluid, CTX and vancomycin.  Infectious disease consulted, and changed antibiotics to cefdinir and Zyvox.  Therapy recommended SNF.  Subjective: Seen and examined earlier this morning.  No major events overnight of this morning.  She had a lot of pain with bleeding during dressing change yesterday evening.  She has no complaints this morning other than pruritic skin rash over her back.  She denies pain, shortness of breath, GI or UTI symptoms.  Objective: Vitals:   06/29/21 0405 06/29/21 0500 06/29/21 0743 06/29/21 1419  BP: 123/62   125/63  Pulse: (!) 101   97  Resp: 18   20  Temp: 98.2 F (36.8 C)   98.4 F (36.9 C)  TempSrc:    Oral  SpO2: 94%  96% 94%  Weight:  90.8 kg    Height:        Intake/Output Summary (Last 24 hours) at 06/29/2021 1537 Last data filed at 06/29/2021 0606 Gross per 24 hour  Intake --  Output 800 ml  Net -800 ml   Filed Weights   06/28/21 0500 06/28/21 0640 06/29/21 0500  Weight: 93.6 kg  93.7 kg 90.8 kg    Examination:  GENERAL: No apparent distress.  Nontoxic. HEENT: MMM.  Vision grossly intact.  Diminished hearing. NECK: Supple.  No apparent JVD.  RESP: 94% on RA.  No IWOB.  Fair aeration bilaterally. CVS:  RRR. Heart sounds normal.  ABD/GI/GU: BS+. Abd soft, NTND.  Indwelling Foley. MSK/EXT:  Moves extremities.  Dressing over BLE. SKIN: Dressing over BLE DCI.  Erythematous skin rash over her back and arms.  Pruritic. NEURO: Awake and alert. Oriented appropriately.  No apparent focal neuro deficit. PSYCH: Calm. Normal affect.   Procedures:  None  Microbiology summarized: COVID-19 and influenza PCR nonreactive. MRSA PCR screen positive. Blood cultures NGTD. Urine culture with multiple species. Superficial wound culture from left foot with GPC's Superficial wound culture from left leg with MRSA and Achromobacter xylosoxidans.   Assessment & Plan: Severe sepsis in the setting of LLE cellulitis: POA.  Recently started on p.o. doxycycline at wound clinic. Had significant leukocytosis with tachycardia, lactic acidosis and AKI on presentation.   MRSA PCR screen positive.  Sepsis physiology resolved.  Tachycardia could be due to her theophylline but improved.  No osseous abnormality on x-ray.  Patient's tendency to bleed during dressing change and with legs down makes PVD less likely.  She definitely has significant venous insufficiency.  Cultures as above. -Vancomycin and CTX 8/1-8/5>> cefdinir and Zyvox 8/5 >>per ID -Encourage leg elevation -Continue wound care.   AKI/azotemia:  thought to have underlying CKD 3B which might not be the case.  AKI likely due to sepsis, diuretics, ARB, NSAID use and rhabdo.  Resolved and stable off IV fluid Recent Labs    06/24/21 1559 06/24/21 2206 06/25/21 0243 06/26/21 0242 06/27/21 0457 06/28/21 0442 06/29/21 0528  BUN 98* 80* 73* 47* 29* 23 19  CREATININE 4.66* 3.83* 3.19* 1.73* 1.22* 0.99 0.99  -Renal US negative. -Avoid  or minimize nephrotoxic meds  Elevated anion gap metabolic acidosis: Likely due to azotemia.  Resolved.  Generalized weakness/fall at home/traumatic rhabdomyolysis: CK elevated to 1100> 643> 247.  CT head without acute finding. No significant injury.  -PT/OT eval-recommended SNF.   Acute urinary retention-reportedly had about 628 cc on bladder scan prevoid and Foley catheter was inserted.  Renal US without acute finding.  Failed voiding trial on 8/2. -Continue bethanechol -Voiding trial in the morning.  Hypokalemia/hypophosphatemia: Resolved.   Hyperkalemia: Resolved.  Hyponatremia: Resolved.   Elevated AST: Likely due to rhabdo.  Resolved.   Neuropathic pain-continues to endorse significant pain. -Stable on gabapentin 200 mg 3 times daily.   Sinus tachycardia/hypertension: Normotensive.  Tachycardia resolved. -Continue holding Lasix and losartan   GERD -Continue Protonix  Depression and Anxiety: Stable -Continue bupropion 150 mg p.o. daily  Asthma and COPD: Stable. -Continue with theophylline and Advair substitution -Continue monitor respiratory status carefully and if necessary will place on nebs  Pruritic skin rash-erythematous pruritic skin rash over her back and arms. -Mycolog cream twice daily  Obesity Body mass index is 34.36 kg/m. Nutrition Problem: Increased nutrient needs Etiology: acute illness, wound healing Signs/Symptoms: estimated needs Interventions: MVI, Ensure Enlive (each supplement provides 350kcal and 20 grams of protein), Prostat   DVT prophylaxis:  heparin injection 5,000 Units Start: 06/25/21 0600  Code Status: DNR/DNI Family Communication: Patient and/or RN. Available if any question.  Level of care: Progressive. Change level of care to MedSurg Status is: Inpatient  Remains inpatient appropriate because:Unsafe d/c plan  Dispo: The patient is from: Home              Anticipated d/c is to:  SNF              Patient currently is  medically stable to d/c.   Difficult to place patient No       Consultants:  Infectious disease   Sch Meds:  Scheduled Meds:  (feeding supplement) PROSource Plus  30 mL Oral BID BM   bethanechol  10 mg Oral TID   buPROPion  150 mg Oral Daily   cefdinir  300 mg Oral Q12H   chlorhexidine  15 mL Mouth Rinse BID   Chlorhexidine Gluconate Cloth  6 each Topical Daily   feeding supplement  237 mL Oral Q24H   gabapentin  200 mg Oral TID   heparin  5,000 Units Subcutaneous Q8H   linezolid  600 mg Oral Q12H   mouth rinse  15 mL Mouth Rinse q12n4p   mometasone-formoterol  2 puff Inhalation BID   multivitamin with minerals  1 tablet Oral Daily   nystatin-triamcinolone   Topical BID   pantoprazole  40 mg Oral Daily   silver sulfADIAZINE   Topical BID   theophylline  400 mg Oral Daily   Continuous Infusions:   PRN Meds:.acetaminophen **OR** acetaminophen, diphenhydrAMINE, hydrALAZINE, HYDROmorphone (DILAUDID) injection, levalbuterol, nystatin, ondansetron **OR** ondansetron (ZOFRAN) IV, oxyCODONE, polyethylene glycol  Antimicrobials: Anti-infectives (From admission, onward)    Start     Dose/Rate Route Frequency Ordered Stop  06/28/21 2200  linezolid (ZYVOX) tablet 600 mg        600 mg Oral Every 12 hours 06/28/21 1511 07/06/21 0959   06/28/21 1600  cefdinir (OMNICEF) capsule 300 mg        300 mg Oral Every 12 hours 06/28/21 1511 07/06/21 0959   06/27/21 1800  vancomycin (VANCOREADY) IVPB 750 mg/150 mL  Status:  Discontinued        750 mg 150 mL/hr over 60 Minutes Intravenous Every 24 hours 06/27/21 0811 06/28/21 1511   06/26/21 1800  vancomycin (VANCOREADY) IVPB 500 mg/100 mL  Status:  Discontinued        500 mg 100 mL/hr over 60 Minutes Intravenous Every 48 hours 06/24/21 1917 06/26/21 0836   06/26/21 1800  vancomycin (VANCOCIN) IVPB 1000 mg/200 mL premix  Status:  Discontinued        1,000 mg 200 mL/hr over 60 Minutes Intravenous Every 48 hours 06/26/21 0836 06/27/21 0811    06/25/21 1600  cefTRIAXone (ROCEPHIN) 2 g in sodium chloride 0.9 % 100 mL IVPB  Status:  Discontinued        2 g 200 mL/hr over 30 Minutes Intravenous Every 24 hours 06/24/21 2059 06/28/21 1511   06/24/21 2245  cefTRIAXone (ROCEPHIN) 2 g in sodium chloride 0.9 % 100 mL IVPB  Status:  Discontinued        2 g 200 mL/hr over 30 Minutes Intravenous  Once 06/24/21 2145 06/24/21 2149   06/24/21 2245  cefTRIAXone (ROCEPHIN) 2 g in sodium chloride 0.9 % 100 mL IVPB  Status:  Discontinued        2 g 200 mL/hr over 30 Minutes Intravenous Every 24 hours 06/24/21 2145 06/24/21 2149   06/24/21 2100  cefTRIAXone (ROCEPHIN) 2 g in sodium chloride 0.9 % 100 mL IVPB  Status:  Discontinued        2 g 200 mL/hr over 30 Minutes Intravenous Every 24 hours 06/24/21 2059 06/24/21 2100   06/24/21 1530  vancomycin (VANCOCIN) IVPB 1000 mg/200 mL premix        1,000 mg 200 mL/hr over 60 Minutes Intravenous  Once 06/24/21 1520 06/24/21 1854   06/24/21 1530  cefTRIAXone (ROCEPHIN) 2 g in sodium chloride 0.9 % 100 mL IVPB        2 g 200 mL/hr over 30 Minutes Intravenous  Once 06/24/21 1520 06/24/21 1650        I have personally reviewed the following labs and images: CBC: Recent Labs  Lab 06/24/21 1559 06/24/21 2206 06/25/21 0243 06/26/21 0242 06/27/21 0457 06/28/21 0442 06/29/21 0528  WBC 22.0*   < > 15.6* 10.3 9.2 10.6* 9.9  NEUTROABS 19.3*  --   --  7.4  --   --   --   HGB 14.8   < > 12.8 12.1 11.4* 11.7* 11.6*  HCT 44.6   < > 38.1 38.1 35.3* 37.1 35.9*  MCV 99.3   < > 98.2 102.1* 101.4* 101.6* 100.8*  PLT 414*   < > 303 297 295 288 297   < > = values in this interval not displayed.   BMP &GFR Recent Labs  Lab 06/24/21 2206 06/25/21 0243 06/26/21 0242 06/27/21 0457 06/28/21 0442 06/29/21 0528  NA 134* 137 138 139 141 138  K 4.3 4.2 3.2* 4.0 3.7 3.6  CL 98 105 110 113* 115* 111  CO2 20* 20* 17* 20* 20* 23  GLUCOSE 100* 90 82 102* 98 94  BUN 80* 73* 47* 29* 23 19  CREATININE 3.83*  3.19* 1.73* 1.22* 0.99 0.99  CALCIUM 9.2 9.1 8.8* 8.8* 8.8* 8.5*  MG 2.6*  --  2.2 2.0 1.9 2.1  PHOS 5.9*  --  3.6 1.4* 1.8* 2.4*   Estimated Creatinine Clearance: 49.4 mL/min (by C-G formula based on SCr of 0.99 mg/dL). Liver & Pancreas: Recent Labs  Lab 06/24/21 2206 06/25/21 0243 06/26/21 0242 06/27/21 0457 06/28/21 0442 06/29/21 0528  AST 61* 65* 60*  --  42* 34  ALT 29 28 33  --  37 35  ALKPHOS 79 74 75  --  62 61  BILITOT 0.8 0.7 0.8  --  0.6 0.8  PROT 6.6 6.4* 5.9*  --  5.4* 5.4*  ALBUMIN 2.8* 2.5* 2.4* 2.2* 2.3* 2.3*   No results for input(s): LIPASE, AMYLASE in the last 168 hours. Recent Labs  Lab 06/26/21 1442  AMMONIA 17   Diabetic: No results for input(s): HGBA1C in the last 72 hours.  Recent Labs  Lab 06/28/21 1951 06/29/21 0009 06/29/21 0402 06/29/21 0811 06/29/21 1211  GLUCAP 87 112* 99 105* 126*   Cardiac Enzymes: Recent Labs  Lab 06/25/21 0728 06/26/21 0242 06/27/21 0457 06/28/21 0442 06/29/21 0528  CKTOTAL 1,100* 643* 402* 247* 190   No results for input(s): PROBNP in the last 8760 hours. Coagulation Profile: Recent Labs  Lab 06/24/21 1559  INR 1.0   Thyroid Function Tests: No results for input(s): TSH, T4TOTAL, FREET4, T3FREE, THYROIDAB in the last 72 hours.  Lipid Profile: No results for input(s): CHOL, HDL, LDLCALC, TRIG, CHOLHDL, LDLDIRECT in the last 72 hours. Anemia Panel: No results for input(s): VITAMINB12, FOLATE, FERRITIN, TIBC, IRON, RETICCTPCT in the last 72 hours.  Urine analysis:    Component Value Date/Time   COLORURINE YELLOW 06/24/2021 2100   APPEARANCEUR HAZY (A) 06/24/2021 2100   LABSPEC 1.017 06/24/2021 2100   PHURINE 6.0 06/24/2021 2100   GLUCOSEU NEGATIVE 06/24/2021 2100   HGBUR MODERATE (A) 06/24/2021 2100   BILIRUBINUR NEGATIVE 06/24/2021 2100   KETONESUR NEGATIVE 06/24/2021 2100   PROTEINUR 100 (A) 06/24/2021 2100   NITRITE NEGATIVE 06/24/2021 2100   LEUKOCYTESUR MODERATE (A) 06/24/2021 2100    Sepsis Labs: Invalid input(s): PROCALCITONIN, LACTICIDVEN  Microbiology: Recent Results (from the past 240 hour(s))  Blood Culture (routine x 2)     Status: None   Collection Time: 06/24/21  3:48 PM   Specimen: BLOOD  Result Value Ref Range Status   Specimen Description   Final    BLOOD LEFT ANTECUBITAL Performed at [facility], [address], [place], [place] [address]    Special Requests   Final    BOTTLES DRAWN AEROBIC AND ANAEROBIC Blood Culture adequate volume Performed at [facility], [address], [place], [place] [address]    Culture   Final    NO GROWTH 5 DAYS Performed at [facility], [address] [place], [place] [address]    Report Status 06/29/2021 FINAL  Final  Blood Culture (routine x 2)     Status: None   Collection Time: 06/24/21  3:48 PM   Specimen: BLOOD  Result Value Ref Range Status   Specimen Description   Final    BLOOD RIGHT ANTECUBITAL Performed at [facility], [address], [place], [place] [address]    Special Requests   Final    BOTTLES DRAWN AEROBIC AND ANAEROBIC Blood Culture adequate volume Performed at [facility], [address], [place], [place] [address]  Culture   Final    NO GROWTH 5 DAYS Performed at [facility], [address], [place], [place] [address]    Report Status 06/29/2021 FINAL  Final  Resp Panel by RT-PCR (Flu A&B, Covid) Nasopharyngeal Swab     Status: None   Collection Time: 06/24/21  3:51 PM   Specimen: Nasopharyngeal Swab; Nasopharyngeal(NP) swabs in vial transport medium  Result Value Ref Range Status   SARS Coronavirus 2 by RT PCR NEGATIVE NEGATIVE Final    Comment: (NOTE) SARS-CoV-2 target nucleic acids are NOT DETECTED.  The SARS-CoV-2 RNA is generally detectable in upper respiratory specimens during the acute phase of infection. The lowest concentration of SARS-CoV-2 viral copies this assay can detect  is 138 copies/mL. A negative result does not preclude SARS-Cov-2 infection and should not be used as the sole basis for treatment or other patient management decisions. A negative result may occur with  improper specimen collection/handling, submission of specimen other than nasopharyngeal swab, presence of viral mutation(s) within the areas targeted by this assay, and inadequate number of viral copies(<138 copies/mL). A negative result must be combined with clinical observations, patient history, and epidemiological information. The expected result is Negative.  Fact Sheet for Patients:  BloggerCourse.com  Fact Sheet for Healthcare Providers:  SeriousBroker.it  This test is no t yet approved or cleared by the [place] FDA and  has been authorized for detection and/or diagnosis of SARS-CoV-2 by FDA under an Emergency Use Authorization (EUA). This EUA will remain  in effect (meaning this test can be used) for the duration of the COVID-19 declaration under Section 564(b)(1) of the Act, 21 U.S.C.section 360bbb-3(b)(1), unless the authorization is terminated  or revoked sooner.       Influenza A by PCR NEGATIVE NEGATIVE Final   Influenza B by PCR NEGATIVE NEGATIVE Final    Comment: (NOTE) The Xpert Xpress SARS-CoV-2/FLU/RSV plus assay is intended as an aid in the diagnosis of influenza from Nasopharyngeal swab specimens and should not be used as a sole basis for treatment. Nasal washings and aspirates are unacceptable for Xpert Xpress SARS-CoV-2/FLU/RSV testing.  Fact Sheet for Patients: BloggerCourse.com  Fact Sheet for Healthcare Providers: SeriousBroker.it  This test is not yet approved or cleared by the [place] FDA and has been authorized for detection and/or diagnosis of SARS-CoV-2 by FDA under an Emergency Use Authorization (EUA). This EUA will remain in effect  (meaning this test can be used) for the duration of the COVID-19 declaration under Section 564(b)(1) of the Act, 21 U.S.C. section 360bbb-3(b)(1), unless the authorization is terminated or revoked.  Performed at [facility], [address] [place], [place] [address]   Urine Culture     Status: Abnormal   Collection Time: 06/24/21  9:00 PM   Specimen: In/Out Cath Urine  Result Value Ref Range Status   Specimen Description   Final    IN/OUT CATH URINE Performed at [facility], [address], [place], [place] [address]    Special Requests   Final    NONE Performed at [facility], [address] [place], [place] [address]    Culture (A)  Final    >=100,000 COLONIES/mL MULTIPLE SPECIES PRESENT, SUGGEST RECOLLECTION   Report Status 06/26/2021 FINAL  Final  MRSA Next Gen by PCR, Nasal     Status: Abnormal   Collection Time: 06/24/21  9:47 PM   Specimen: Nasal Mucosa; Nasal Swab  Result Value Ref Range Status  MRSA by PCR Next Gen DETECTED (A) NOT DETECTED Final    Comment: RESULT CALLED TO, READ BACK BY AND VERIFIED WITH: RN S MORDUE AT 2351 06/24/21 CRUICKSHANK A (NOTE) The GeneXpert MRSA Assay (FDA approved for NASAL specimens only), is one component of a comprehensive MRSA colonization surveillance program. It is not intended to diagnose MRSA infection nor to guide or monitor treatment for MRSA infections. Test performance is not FDA approved in patients less than 69 years old. Performed at [facility], [address], [place], [place] [address]   Aerobic Culture w Gram Stain (superficial specimen)     Status: None   Collection Time: 06/25/21  1:05 PM   Specimen: Leg  Result Value Ref Range Status   Specimen Description   Final    LEG LEFT Performed at [facility], [address], [place], [place] [address]    Special Requests   Final    NONE Performed at [facility], [address] [place], [place] [address]    Gram Stain   Final    RARE WBC PRESENT, PREDOMINANTLY PMN NO ORGANISMS SEEN Performed at [facility], [address] [place], [place] [address]    Culture   Final    FEW METHICILLIN RESISTANT STAPHYLOCOCCUS AUREUS RARE ACHROMOBACTER XYLOSOXIDANS    Report Status 06/28/2021 FINAL  Final   Organism ID, Bacteria METHICILLIN RESISTANT STAPHYLOCOCCUS AUREUS  Final   Organism ID, Bacteria ACHROMOBACTER XYLOSOXIDANS  Final      Susceptibility   Methicillin resistant staphylococcus aureus - MIC*    CIPROFLOXACIN >=8 RESISTANT Resistant     ERYTHROMYCIN >=8 RESISTANT Resistant     GENTAMICIN <=0.5 SENSITIVE Sensitive     OXACILLIN >=4 RESISTANT Resistant     TETRACYCLINE <=1 SENSITIVE Sensitive     VANCOMYCIN <=0.5 SENSITIVE Sensitive     TRIMETH/SULFA <=10 SENSITIVE Sensitive     CLINDAMYCIN <=0.25 SENSITIVE Sensitive     RIFAMPIN <=0.5 SENSITIVE Sensitive     Inducible Clindamycin NEGATIVE Sensitive     * FEW METHICILLIN RESISTANT STAPHYLOCOCCUS AUREUS   Achromobacter xylosoxidans - MIC*    CEFAZOLIN >=64 RESISTANT Resistant     GENTAMICIN >=16 RESISTANT Resistant     CIPROFLOXACIN >=4 RESISTANT Resistant     IMIPENEM 2 SENSITIVE Sensitive     TRIMETH/SULFA <=20 SENSITIVE Sensitive     * RARE ACHROMOBACTER XYLOSOXIDANS  Aerobic Culture w Gram Stain (superficial specimen)     Status: None (Preliminary result)   Collection Time: 06/25/21  1:05 PM   Specimen: Toe  Result Value Ref Range Status   Specimen Description   Final    TOE LEFT INTERDIGITAL Performed at [facility], [address], [place], [place] [address]    Special Requests   Final    NONE Performed at [facility], [address] [place], [place] [address]    Gram Stain NO WBC SEEN FEW GRAM POSITIVE COCCI   Final   Culture   Final    CULTURE REINCUBATED FOR BETTER GROWTH Performed at [facility], [address], [place], [place] [address]    Report Status PENDING  Incomplete    Radiology Studies: No results found.     Yaniv Lage T. Litisha Guagliardo [facility]  If 7PM-7AM, please contact night-coverage [email or web address] 06/29/2021, 3:37 PM

## 2021-06-29 NOTE — TOC Progression Note (Addendum)
Transition of Care [facility]) - Progression Note    Patient Details  Name: [name] MRN: [id number] Date of Birth: 1941-08-27  Transition of [facility] CM/SW Contact  Darleene Cleaver, [place] Phone Number: 06/29/2021, 4:04 PM  Clinical Narrative:    Patient has been faxed out awaiting bed offers for SNF placement.  Insurance Berkley Harvey will have to be started once family chooses a facility.   Expected Discharge Plan: Skilled Nursing Facility Barriers to Discharge: Continued Medical Work up  Expected Discharge Plan and Services Expected Discharge Plan: Skilled Nursing Facility   Discharge Planning Services: CM Consult   Living arrangements for the past 2 months: Single Family Home                                       Social Determinants of Health (SDOH) Interventions    Readmission Risk Interventions No flowsheet data found.

## 2021-06-29 NOTE — [place] FL2 (Signed)
[place] MEDICAID FL2 LEVEL OF CARE SCREENING TOOL     IDENTIFICATION  Patient Name: [name] Birthdate: 1941-05-27 Sex: female Admission Date (Current Location): 06/24/2021  [facility] and [place] Number:  Producer, television/film/video and Address:  [facility],  [address] [place], [place] [address]      Provider Number: [id number]  Attending Physician Name and Address:  Almon Hercules, MD  Relative Name and Phone Number:  spouse, Karelyn Brisby @ [phone]    Current Level of Care: Hospital Recommended Level of Care: Skilled Nursing Facility Prior Approval Number:    Date Approved/Denied:   PASRR Number: [id number]  Discharge Plan: SNF    Current Diagnoses: Patient Active Problem List   Diagnosis Date Noted   Cellulitis of left lower extremity 06/25/2021   AKI (acute kidney injury) (HCC) 06/25/2021   Severe sepsis (HCC) 06/24/2021   Venous stasis ulcer of left calf with fat layer exposed without varicose veins (HCC) 03/24/2021   Polyneuropathy 08/28/2017   Lumbar radiculopathy 08/28/2017   Paresthesia 07/30/2017   Chronic low back pain 07/30/2017   Chronic major depressive disorder 06/03/2016   Chronic renal impairment, stage 3 (moderate) (HCC) 06/03/2016   GERD (gastroesophageal reflux disease) 06/03/2016   Hearing loss 06/03/2016   Chronic obstructive asthma (HCC) 06/03/2016   Hypertension 06/03/2016    Orientation RESPIRATION BLADDER Height & Weight     Self, Time, Situation, Place  Normal Indwelling catheter Weight: 200 lb 2.8 oz (90.8 kg) Height:  5\' 4"  (162.6 cm)  BEHAVIORAL SYMPTOMS/MOOD NEUROLOGICAL BOWEL NUTRITION STATUS      Continent, Incontinent Diet (regualr)  AMBULATORY STATUS COMMUNICATION OF NEEDS Skin   Extensive Assist Verbally Other (Comment) (wound care needs twice a day changes.)                       Personal Care Assistance Level of Assistance  Bathing, Dressing Bathing Assistance: Limited assistance   Dressing  Assistance: Limited assistance     Functional Limitations Info             SPECIAL CARE FACTORS FREQUENCY  PT (By licensed PT), OT (By licensed OT)     PT Frequency: 5x a week OT Frequency: 5x a week            Contractures Contractures Info: Not present    Additional Factors Info  Code Status, Allergies Code Status Info: DNR Allergies Info: Meloxicam           Current Medications (06/29/2021):  This is the current hospital active medication list Current Facility-Administered Medications  Medication Dose Route Frequency Provider Last Rate Last Admin   (feeding supplement) PROSource Plus liquid 30 mL  30 mL Oral BID BM Gonfa, Taye T, MD   30 mL at 06/28/21 2228   acetaminophen (TYLENOL) tablet 650 mg  650 mg Oral Q6H PRN 2229 T, MD   650 mg at 06/28/21 08/28/21   Or   acetaminophen (TYLENOL) suppository 650 mg  650 mg Rectal Q6H PRN 4034 T, MD       bethanechol (URECHOLINE) tablet 10 mg  10 mg Oral TID Candelaria Stagers T, MD   10 mg at 06/28/21 2229   buPROPion (WELLBUTRIN XL) 24 hr tablet 150 mg  150 mg Oral Daily 2230 T, MD   150 mg at 06/28/21 1100   cefdinir (OMNICEF) capsule 300 mg  300 mg Oral Q12H 08/28/21, DO   300 mg at 06/28/21 1700  chlorhexidine (PERIDEX) 0.12 % solution 15 mL  15 mL Mouth Rinse BID Candelaria Stagers T, MD   15 mL at 06/28/21 2228   Chlorhexidine Gluconate Cloth 2 % PADS 6 each  6 each Topical Daily Candelaria Stagers T, MD   6 each at 06/28/21 1156   diphenhydrAMINE (BENADRYL) capsule 25 mg  25 mg Oral Q8H PRN Candelaria Stagers T, MD   25 mg at 06/25/21 0806   feeding supplement (ENSURE ENLIVE / ENSURE PLUS) liquid 237 mL  237 mL Oral Q24H Gonfa, Taye T, MD   237 mL at 06/28/21 1700   gabapentin (NEURONTIN) capsule 200 mg  200 mg Oral TID Candelaria Stagers T, MD   200 mg at 06/28/21 2228   heparin injection 5,000 Units  5,000 Units Subcutaneous Q8H Candelaria Stagers T, MD   5,000 Units at 06/29/21 0626   hydrALAZINE (APRESOLINE) injection 10 mg  10 mg  Intravenous Q6H PRN Candelaria Stagers T, MD       HYDROmorphone (DILAUDID) injection 0.5 mg  0.5 mg Intravenous BID PRN Candelaria Stagers T, MD   0.5 mg at 06/29/21 0934   levalbuterol (XOPENEX) nebulizer solution 0.63 mg  0.63 mg Nebulization Q6H PRN Almon Hercules, MD       linezolid (ZYVOX) tablet 600 mg  600 mg Oral Q12H Kathlynn Grate, DO   600 mg at 06/28/21 2229   MEDLINE mouth rinse  15 mL Mouth Rinse q12n4p Gonfa, Taye T, MD   15 mL at 06/28/21 1701   mometasone-formoterol (DULERA) 200-5 MCG/ACT inhaler 2 puff  2 puff Inhalation BID Candelaria Stagers T, MD   2 puff at 06/29/21 0741   multivitamin with minerals tablet 1 tablet  1 tablet Oral Daily Candelaria Stagers T, MD   1 tablet at 06/28/21 1100   mupirocin ointment (BACTROBAN) 2 % 1 application  1 application Nasal BID Candelaria Stagers T, MD   1 application at 06/28/21 2228   nystatin (MYCOSTATIN/NYSTOP) topical powder   Topical PRN Almon Hercules, MD   Given at 06/24/21 2340   ondansetron (ZOFRAN) tablet 4 mg  4 mg Oral Q6H PRN Almon Hercules, MD       Or   ondansetron (ZOFRAN) injection 4 mg  4 mg Intravenous Q6H PRN Gonfa, Taye T, MD       oxyCODONE (Oxy IR/ROXICODONE) immediate release tablet 5 mg  5 mg Oral Q4H PRN Candelaria Stagers T, MD   5 mg at 06/28/21 1659   pantoprazole (PROTONIX) EC tablet 40 mg  40 mg Oral Daily Gonfa, Taye T, MD   40 mg at 06/28/21 1100   polyethylene glycol (MIRALAX / GLYCOLAX) packet 17 g  17 g Oral Daily PRN Almon Hercules, MD       silver sulfADIAZINE (SILVADENE) 1 % cream   Topical BID Almon Hercules, MD   Given at 06/28/21 2247   theophylline (UNIPHYL) 400 MG 24 hr tablet 400 mg  400 mg Oral Daily Candelaria Stagers T, MD   400 mg at 06/28/21 1100     Discharge Medications: Please see discharge summary for a list of discharge medications.  Relevant Imaging Results:  Relevant Lab Results:   Additional Information SSN:[id number]  Larrie Kass, LCSW

## 2021-06-29 NOTE — Progress Notes (Signed)
Occupational Therapy Treatment Patient Details Name: [name] MRN: [id number] DOB: November 28, 1940 Today's Date: 06/29/2021    History of present illness patient is a 80 year old female who presented to the hosptial on 8/1 after a fall resulting in increasd leg pain. patient was admitted for severe sepsis due to LLE cellulitis, hyperkalemia, and acute kidney injury. CWU:GQBVQXI, depression, renal cystitis/ulcer,osteoarthrits, and LE wounds   OT comments  Patient was noted to have increased redness on back with patient reporting increased itching. MD made aware.Patient required increased education and redirection to attend to tasks. Patient required max A for attempting to sit up on edge of bed with patient unable to complete due to incontinence episode. Patient required consistent redirection to maintain sidelying with mod A and not reach back into soiled area. Patient's discharge plan remains appropriate at this time. OT will continue to follow acutely.    Follow Up Recommendations  SNF    Equipment Recommendations  Tub/shower seat    Recommendations for Other Services      Precautions / Restrictions Precautions Precautions: Fall Precaution Comments: bilateral Unna boots, Restrictions Weight Bearing Restrictions: No       Mobility Bed Mobility Overal bed mobility: Needs Assistance Bed Mobility: Rolling Rolling: Max assist;+2 for safety/equipment (with max multimodal cues for movement of limbs, and to attend to task.)         General bed mobility comments: unable to assess sitting on edge of bed on this date.    Transfers                      Balance                                           ADL either performed or assessed with clinical judgement   ADL                 Lower Body Bathing Details (indicate cue type and reason): patient is TD with LB bathign tasks with mod A for maitnaining sidelying for bathign tasks while using bed  rail. patient had no awareness of incontinence episode or that it was on bilateral hands. patient was noted to attempt to bring soiled item from just out of reach near face and was agitated with therapist when redirected to clean piece of linen to hold.                       General ADL Comments: unable to attempt transfer on this date with attempts made at sitting on edge of bed. patient was noted to have hearing aid out during this session with increased volume and redirection needed to attent to tasks. patient was max A for long sitting in bed with max multimodal education for proper hand placement.     Vision       Perception     Praxis      Cognition Arousal/Alertness: Awake/alert Behavior During Therapy: Anxious;Impulsive Overall Cognitive Status: Difficult to assess                                 General Comments: patient is HOH and easily distracted. patient was fixated on dressing changes for BLE.        Exercises     Shoulder Instructions  General Comments      Pertinent Vitals/ Pain       Faces Pain Scale: Hurts even more Pain Location: LLE and left heel. patient would yell with thought of movement and touching leg. Pain Descriptors / Indicators: Crying;Discomfort;Moaning Pain Intervention(s): Limited activity within patient's tolerance;Monitored during session  Home Living                                          Prior Functioning/Environment              Frequency  Min 2X/week        Progress Toward Goals  OT Goals(current goals can now be found in the care plan section)     Acute Rehab OT Goals Patient Stated Goal: wants LLE to stop hurting OT Goal Formulation: With patient Time For Goal Achievement: 07/11/21 Potential to Achieve Goals: Fair  Plan Discharge plan remains appropriate    Co-evaluation                 AM-PAC OT "6 Clicks" Daily Activity     Outcome Measure   Help  from another person eating meals?: A Little Help from another person taking care of personal grooming?: A Little Help from another person toileting, which includes using toliet, bedpan, or urinal?: Total Help from another person bathing (including washing, rinsing, drying)?: A Lot Help from another person to put on and taking off regular upper body clothing?: A Little Help from another person to put on and taking off regular lower body clothing?: A Lot 6 Click Score: 14    End of Session    OT Visit Diagnosis: Muscle weakness (generalized) (M62.81);Pain;History of falling (Z91.81) Pain - Right/Left: Left Pain - part of body: Leg   Activity Tolerance Patient limited by pain   Patient Left in bed;with call bell/phone within reach   Nurse Communication Other (comment) (nurse present in room at end of session for dressing changes.)        Time: 5329-9242 OT Time Calculation (min): 31 min  Charges: OT General Charges $OT Visit: 1 Visit OT Treatments $Self Care/Home Management : 23-37 mins  [name] OTR/L, MS Acute Rehabilitation Department Office# [phone] Pager# [phone]    [name] [name] 06/29/2021, 12:39 PM

## 2021-06-30 LAB — AEROBIC CULTURE W GRAM STAIN (SUPERFICIAL SPECIMEN): Gram Stain: NONE SEEN

## 2021-06-30 LAB — IRON AND TIBC
Iron: 52 ug/dL (ref 28–170)
Saturation Ratios: 34 % — ABNORMAL HIGH (ref 10.4–31.8)
TIBC: 152 ug/dL — ABNORMAL LOW (ref 250–450)
UIBC: 100 ug/dL

## 2021-06-30 LAB — RETICULOCYTES
Immature Retic Fract: 28.7 % — ABNORMAL HIGH (ref 2.3–15.9)
RBC.: 3.6 MIL/uL — ABNORMAL LOW (ref 3.87–5.11)
Retic Count, Absolute: 85.3 10*3/uL (ref 19.0–186.0)
Retic Ct Pct: 2.4 % (ref 0.4–3.1)

## 2021-06-30 LAB — GLUCOSE, CAPILLARY
Glucose-Capillary: 100 mg/dL — ABNORMAL HIGH (ref 70–99)
Glucose-Capillary: 104 mg/dL — ABNORMAL HIGH (ref 70–99)
Glucose-Capillary: 114 mg/dL — ABNORMAL HIGH (ref 70–99)
Glucose-Capillary: 115 mg/dL — ABNORMAL HIGH (ref 70–99)
Glucose-Capillary: 123 mg/dL — ABNORMAL HIGH (ref 70–99)
Glucose-Capillary: 135 mg/dL — ABNORMAL HIGH (ref 70–99)

## 2021-06-30 LAB — VITAMIN B12: Vitamin B-12: 821 pg/mL (ref 180–914)

## 2021-06-30 LAB — FERRITIN: Ferritin: 175 ng/mL (ref 11–307)

## 2021-06-30 LAB — PROTIME-INR
INR: 1 (ref 0.8–1.2)
Prothrombin Time: 13 seconds (ref 11.4–15.2)

## 2021-06-30 LAB — APTT: aPTT: 32 seconds (ref 24–36)

## 2021-06-30 LAB — FOLATE: Folate: 28.1 ng/mL (ref >5.9)
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
## 2021-06-30 NOTE — Progress Notes (Signed)
PROGRESS NOTE  [name] OBS:[id number] DOB: May 14, 1941   PCP: Gwenlyn Found, MD  Patient is from: Home  DOA: 06/24/2021 LOS: 6  Chief complaints:  Chief Complaint  Patient presents with   Fall   Weakness     Brief Narrative / Interim history: 80 year old M with PMH of renal cystitis/ulcer followed at wound clinic, CKD-3B, asthma/COPD, neuropathy, HTN, osteoarthritis, anxiety, depression, RLS, diminished hearing and GERD presenting with generalized weakness, fall at home, increased leg pain, ulceration and drainage, and admitted for severe sepsis due to LLE cellulitis, AKI, hyperkalemia and acute urinary retention.  Cultures obtained.  Patient was started on IV ceftriaxone, IV vancomycin and IV fluid.  Wound care consulted.   Patient is improving.  Blood culture NGTD.  Urine culture with multiple species.  Superficial wound culture from left leg with MRSA and Achromobacter xylosoxidans.  Superficial wound culture from foot with few GPC's.  Remains on IV fluid, CTX and vancomycin.  Infectious disease consulted, and changed antibiotics to cefdinir and Zyvox.  Getting daily wound care.  Medically stable for discharge to SNF    Subjective: Seen and examined earlier this morning.  No major events overnight of this morning.  No complaints.  Reports some pain in his hips but not in the legs.  Denies chest pain, dyspnea, GI or UTI symptoms.  Objective: Vitals:   06/29/21 2031 06/30/21 0606 06/30/21 0758 06/30/21 1433  BP: 126/69 136/72  136/66  Pulse: (!) 108 93  (!) 103  Resp: 20 20  20   Temp: 97.9 F (36.6 C) 97.6 F (36.4 C)  98.2 F (36.8 C)  TempSrc: Oral Oral  Oral  SpO2: 99% 94% 92% 95%  Weight:      Height:        Intake/Output Summary (Last 24 hours) at 06/30/2021 1624 Last data filed at 06/30/2021 1000 Gross per 24 hour  Intake 120 ml  Output 450 ml  Net -330 ml   Filed Weights   06/28/21 0500 06/28/21 0640 06/29/21 0500  Weight: 93.6 kg 93.7 kg 90.8 kg     Examination:  GENERAL: No apparent distress.  Nontoxic. HEENT: MMM.  Vision grossly intact.  Diminished hearing. NECK: Supple.  No apparent JVD.  RESP:  No IWOB.  Fair aeration bilaterally. CVS: HR 90-100.  Regular rhythm.  Heart sounds normal.  ABD/GI/GU: BS+. Abd soft, NTND.  MSK/EXT:  Moves extremities. No apparent deformity.  Ace wrap over BLE. SKIN: Ace wrap over BLE. NEURO: Awake and alert. Oriented appropriately.  No apparent focal neuro deficit. PSYCH: Calm. Normal affect.   Procedures:  None  Microbiology summarized: COVID-19 and influenza PCR nonreactive. MRSA PCR screen positive. Blood cultures NGTD. Urine culture with multiple species. Superficial wound culture from left foot with GPC's Superficial wound culture from left leg with MRSA and Achromobacter xylosoxidans.   Assessment & Plan: Severe sepsis in the setting of LLE cellulitis: POA.  Recently started on p.o. doxycycline at wound clinic. Had significant leukocytosis with tachycardia, lactic acidosis and AKI on presentation.   MRSA PCR screen positive.  Sepsis physiology resolved.  Tachycardia could be due to her theophylline but improved.  No osseous abnormality on x-ray.  Patient's tendency to bleed during dressing change and with legs down makes PVD less likely.  She definitely has significant venous insufficiency.  Cultures as above. -Vancomycin and CTX 8/1-8/5>> cefdinir and Zyvox 8/5-8/12 -Encourage leg elevation -Continue daily dressing.  AKI/azotemia: thought to have underlying CKD 3B which might not be the case.  AKI likely due to sepsis, diuretics, ARB, NSAID use and rhabdo.  Resolved and stable off IV fluid Recent Labs    06/24/21 1559 06/24/21 2206 06/25/21 0243 06/26/21 0242 06/27/21 0457 06/28/21 0442 06/29/21 0528  BUN 98* 80* 73* 47* 29* 23 19  CREATININE 4.66* 3.83* 3.19* 1.73* 1.22* 0.99 0.99  -Renal US negative. -Avoid or minimize nephrotoxic meds  Elevated anion gap metabolic  acidosis: Likely due to azotemia.  Resolved.  Generalized weakness/fall at home/traumatic rhabdomyolysis: CK elevated to 1100> 643> 247.  CT head without acute finding. No significant injury.  -PT/OT eval-recommended SNF.   Acute urinary retention-reportedly had about 628 cc on bladder scan prevoid and Foley catheter was inserted.  Renal US without acute finding.  Failed voiding trial on 8/2. -Continue bethanechol -Voiding trial in the morning before discharge.  Normocytic anemia: Anemia panel within normal.  H&H relatively stable. Recent Labs    06/24/21 1559 06/24/21 2206 06/25/21 0243 06/26/21 0242 06/27/21 0457 06/28/21 0442 06/29/21 0528  HGB 14.8 13.1 12.8 12.1 11.4* 11.7* 11.6*  -Recheck CBC as needed   Hypokalemia/hypophosphatemia: Resolved.   Hyperkalemia: Resolved.  Hyponatremia: Resolved.   Elevated AST: Likely due to rhabdo.  Resolved.   Neuropathic pain: Denies pain today. -Stable on gabapentin 200 mg 3 times daily.   Sinus tachycardia/hypertension: Normotensive.  Tachycardia resolved. -Continue holding Lasix and losartan   GERD -Continue Protonix  Depression and Anxiety: Stable -Continue bupropion 150 mg p.o. daily  Asthma and COPD: Stable. -Continue with theophylline and Advair substitution -Continue monitor respiratory status carefully and if necessary will place on nebs  Pruritic skin rash-erythematous pruritic skin rash over her back and arms. -Mycolog cream twice daily  Obesity Body mass index is 34.36 kg/m. Nutrition Problem: Increased nutrient needs Etiology: acute illness, wound healing Signs/Symptoms: estimated needs Interventions: MVI, Ensure Enlive (each supplement provides 350kcal and 20 grams of protein), Prostat   DVT prophylaxis:  heparin injection 5,000 Units Start: 06/25/21 0600  Code Status: DNR/DNI Family Communication: Patient and/or RN.  Attempted to call patient's husband for update at 4:25 PM but no answer. Level of  care: Med-Surg. Status is: Inpatient  Remains inpatient appropriate because:Unsafe d/c plan  Dispo: The patient is from: Home              Anticipated d/c is to:  SNF              Patient currently is medically stable to d/c.   Difficult to place patient No       Consultants:  Infectious disease-signed off   Sch Meds:  Scheduled Meds:  (feeding supplement) PROSource Plus  30 mL Oral BID BM   bethanechol  10 mg Oral TID   buPROPion  150 mg Oral Daily   cefdinir  300 mg Oral Q12H   chlorhexidine  15 mL Mouth Rinse BID   Chlorhexidine Gluconate Cloth  6 each Topical Daily   feeding supplement  237 mL Oral Q24H   gabapentin  200 mg Oral TID   heparin  5,000 Units Subcutaneous Q8H   linezolid  600 mg Oral Q12H   mouth rinse  15 mL Mouth Rinse q12n4p   mometasone-formoterol  2 puff Inhalation BID   multivitamin with minerals  1 tablet Oral Daily   nystatin-triamcinolone   Topical BID   pantoprazole  40 mg Oral Daily   silver sulfADIAZINE   Topical BID   theophylline  400 mg Oral Daily   Continuous Infusions:   PRN Meds:.acetaminophen **OR**  acetaminophen, diphenhydrAMINE, hydrALAZINE, HYDROmorphone (DILAUDID) injection, levalbuterol, nystatin, ondansetron **OR** ondansetron (ZOFRAN) IV, oxyCODONE, polyethylene glycol  Antimicrobials: Anti-infectives (From admission, onward)    Start     Dose/Rate Route Frequency Ordered Stop   06/28/21 2200  linezolid (ZYVOX) tablet 600 mg        600 mg Oral Every 12 hours 06/28/21 1511 07/06/21 0959   06/28/21 1600  cefdinir (OMNICEF) capsule 300 mg        300 mg Oral Every 12 hours 06/28/21 1511 07/06/21 0959   06/27/21 1800  vancomycin (VANCOREADY) IVPB 750 mg/150 mL  Status:  Discontinued        750 mg 150 mL/hr over 60 Minutes Intravenous Every 24 hours 06/27/21 0811 06/28/21 1511   06/26/21 1800  vancomycin (VANCOREADY) IVPB 500 mg/100 mL  Status:  Discontinued        500 mg 100 mL/hr over 60 Minutes Intravenous Every 48 hours  06/24/21 1917 06/26/21 0836   06/26/21 1800  vancomycin (VANCOCIN) IVPB 1000 mg/200 mL premix  Status:  Discontinued        1,000 mg 200 mL/hr over 60 Minutes Intravenous Every 48 hours 06/26/21 0836 06/27/21 0811   06/25/21 1600  cefTRIAXone (ROCEPHIN) 2 g in sodium chloride 0.9 % 100 mL IVPB  Status:  Discontinued        2 g 200 mL/hr over 30 Minutes Intravenous Every 24 hours 06/24/21 2059 06/28/21 1511   06/24/21 2245  cefTRIAXone (ROCEPHIN) 2 g in sodium chloride 0.9 % 100 mL IVPB  Status:  Discontinued        2 g 200 mL/hr over 30 Minutes Intravenous  Once 06/24/21 2145 06/24/21 2149   06/24/21 2245  cefTRIAXone (ROCEPHIN) 2 g in sodium chloride 0.9 % 100 mL IVPB  Status:  Discontinued        2 g 200 mL/hr over 30 Minutes Intravenous Every 24 hours 06/24/21 2145 06/24/21 2149   06/24/21 2100  cefTRIAXone (ROCEPHIN) 2 g in sodium chloride 0.9 % 100 mL IVPB  Status:  Discontinued        2 g 200 mL/hr over 30 Minutes Intravenous Every 24 hours 06/24/21 2059 06/24/21 2100   06/24/21 1530  vancomycin (VANCOCIN) IVPB 1000 mg/200 mL premix        1,000 mg 200 mL/hr over 60 Minutes Intravenous  Once 06/24/21 1520 06/24/21 1854   06/24/21 1530  cefTRIAXone (ROCEPHIN) 2 g in sodium chloride 0.9 % 100 mL IVPB        2 g 200 mL/hr over 30 Minutes Intravenous  Once 06/24/21 1520 06/24/21 1650        I have personally reviewed the following labs and images: CBC: Recent Labs  Lab 06/24/21 1559 06/24/21 2206 06/25/21 0243 06/26/21 0242 06/27/21 0457 06/28/21 0442 06/29/21 0528  WBC 22.0*   < > 15.6* 10.3 9.2 10.6* 9.9  NEUTROABS 19.3*  --   --  7.4  --   --   --   HGB 14.8   < > 12.8 12.1 11.4* 11.7* 11.6*  HCT 44.6   < > 38.1 38.1 35.3* 37.1 35.9*  MCV 99.3   < > 98.2 102.1* 101.4* 101.6* 100.8*  PLT 414*   < > 303 297 295 288 297   < > = values in this interval not displayed.   BMP &GFR Recent Labs  Lab 06/24/21 2206 06/25/21 0243 06/26/21 0242 06/27/21 0457 06/28/21 0442  06/29/21 0528  NA 134* 137 138 139 141 138  K  4.3 4.2 3.2* 4.0 3.7 3.6  CL 98 105 110 113* 115* 111  CO2 20* 20* 17* 20* 20* 23  GLUCOSE 100* 90 82 102* 98 94  BUN 80* 73* 47* 29* 23 19  CREATININE 3.83* 3.19* 1.73* 1.22* 0.99 0.99  CALCIUM 9.2 9.1 8.8* 8.8* 8.8* 8.5*  MG 2.6*  --  2.2 2.0 1.9 2.1  PHOS 5.9*  --  3.6 1.4* 1.8* 2.4*   Estimated Creatinine Clearance: 49.4 mL/min (by C-G formula based on SCr of 0.99 mg/dL). Liver & Pancreas: Recent Labs  Lab 06/24/21 2206 06/25/21 0243 06/26/21 0242 06/27/21 0457 06/28/21 0442 06/29/21 0528  AST 61* 65* 60*  --  42* 34  ALT 29 28 33  --  37 35  ALKPHOS 79 74 75  --  62 61  BILITOT 0.8 0.7 0.8  --  0.6 0.8  PROT 6.6 6.4* 5.9*  --  5.4* 5.4*  ALBUMIN 2.8* 2.5* 2.4* 2.2* 2.3* 2.3*   No results for input(s): LIPASE, AMYLASE in the last 168 hours. Recent Labs  Lab 06/26/21 1442  AMMONIA 17   Diabetic: No results for input(s): HGBA1C in the last 72 hours.  Recent Labs  Lab 06/29/21 2028 06/30/21 0010 06/30/21 0422 06/30/21 0751 06/30/21 1213  GLUCAP 152* 100* 114* 104* 135*   Cardiac Enzymes: Recent Labs  Lab 06/25/21 0728 06/26/21 0242 06/27/21 0457 06/28/21 0442 06/29/21 0528  CKTOTAL 1,100* 643* 402* 247* 190   No results for input(s): PROBNP in the last 8760 hours. Coagulation Profile: Recent Labs  Lab 06/24/21 1559 06/30/21 0416  INR 1.0 1.0   Thyroid Function Tests: No results for input(s): TSH, T4TOTAL, FREET4, T3FREE, THYROIDAB in the last 72 hours.  Lipid Profile: No results for input(s): CHOL, HDL, LDLCALC, TRIG, CHOLHDL, LDLDIRECT in the last 72 hours. Anemia Panel: Recent Labs    06/30/21 0416  VITAMINB12 821  FOLATE 28.1  FERRITIN 175  TIBC 152*  IRON 52  RETICCTPCT 2.4    Urine analysis:    Component Value Date/Time   COLORURINE YELLOW 06/24/2021 2100   APPEARANCEUR HAZY (A) 06/24/2021 2100   LABSPEC 1.017 06/24/2021 2100   PHURINE 6.0 06/24/2021 2100   GLUCOSEU NEGATIVE  06/24/2021 2100   HGBUR MODERATE (A) 06/24/2021 2100   BILIRUBINUR NEGATIVE 06/24/2021 2100   KETONESUR NEGATIVE 06/24/2021 2100   PROTEINUR 100 (A) 06/24/2021 2100   NITRITE NEGATIVE 06/24/2021 2100   LEUKOCYTESUR MODERATE (A) 06/24/2021 2100   Sepsis Labs: Invalid input(s): PROCALCITONIN, LACTICIDVEN  Microbiology: Recent Results (from the past 240 hour(s))  Blood Culture (routine x 2)     Status: None   Collection Time: 06/24/21  3:48 PM   Specimen: BLOOD  Result Value Ref Range Status   Specimen Description   Final    BLOOD LEFT ANTECUBITAL Performed at [facility], [address], [place], [place] [address]    Special Requests   Final    BOTTLES DRAWN AEROBIC AND ANAEROBIC Blood Culture adequate volume Performed at [facility], [address] [place], [place] [address]    Culture   Final    NO GROWTH 5 DAYS Performed at [facility], [address], [place], [place] [address]    Report Status 06/29/2021 FINAL  Final  Blood Culture (routine x 2)     Status: None   Collection Time: 06/24/21  3:48 PM   Specimen: BLOOD  Result Value Ref Range Status   Specimen Description   Final  BLOOD RIGHT ANTECUBITAL Performed at [facility], [address], [place], [place] [address]    Special Requests   Final    BOTTLES DRAWN AEROBIC AND ANAEROBIC Blood Culture adequate volume Performed at [facility], [address] [place], [place] [address]    Culture   Final    NO GROWTH 5 DAYS Performed at [facility], [address], [place], [place] [address]    Report Status 06/29/2021 FINAL  Final  Resp Panel by RT-PCR (Flu A&B, Covid) Nasopharyngeal Swab     Status: None   Collection Time: 06/24/21  3:51 PM   Specimen: Nasopharyngeal Swab; Nasopharyngeal(NP) swabs in vial transport medium  Result Value Ref Range Status   SARS Coronavirus 2 by RT PCR NEGATIVE NEGATIVE Final    Comment:  (NOTE) SARS-CoV-2 target nucleic acids are NOT DETECTED.  The SARS-CoV-2 RNA is generally detectable in upper respiratory specimens during the acute phase of infection. The lowest concentration of SARS-CoV-2 viral copies this assay can detect is 138 copies/mL. A negative result does not preclude SARS-Cov-2 infection and should not be used as the sole basis for treatment or other patient management decisions. A negative result may occur with  improper specimen collection/handling, submission of specimen other than nasopharyngeal swab, presence of viral mutation(s) within the areas targeted by this assay, and inadequate number of viral copies(<138 copies/mL). A negative result must be combined with clinical observations, patient history, and epidemiological information. The expected result is Negative.  Fact Sheet for Patients:  BloggerCourse.com  Fact Sheet for Healthcare Providers:  SeriousBroker.it  This test is no t yet approved or cleared by the [place] FDA and  has been authorized for detection and/or diagnosis of SARS-CoV-2 by FDA under an Emergency Use Authorization (EUA). This EUA will remain  in effect (meaning this test can be used) for the duration of the COVID-19 declaration under Section 564(b)(1) of the Act, 21 U.S.C.section 360bbb-3(b)(1), unless the authorization is terminated  or revoked sooner.       Influenza A by PCR NEGATIVE NEGATIVE Final   Influenza B by PCR NEGATIVE NEGATIVE Final    Comment: (NOTE) The Xpert Xpress SARS-CoV-2/FLU/RSV plus assay is intended as an aid in the diagnosis of influenza from Nasopharyngeal swab specimens and should not be used as a sole basis for treatment. Nasal washings and aspirates are unacceptable for Xpert Xpress SARS-CoV-2/FLU/RSV testing.  Fact Sheet for Patients: BloggerCourse.com  Fact Sheet for Healthcare  Providers: SeriousBroker.it  This test is not yet approved or cleared by the [place] FDA and has been authorized for detection and/or diagnosis of SARS-CoV-2 by FDA under an Emergency Use Authorization (EUA). This EUA will remain in effect (meaning this test can be used) for the duration of the COVID-19 declaration under Section 564(b)(1) of the Act, 21 U.S.C. section 360bbb-3(b)(1), unless the authorization is terminated or revoked.  Performed at [facility], [address] [place], [place] [address]   Urine Culture     Status: Abnormal   Collection Time: 06/24/21  9:00 PM   Specimen: In/Out Cath Urine  Result Value Ref Range Status   Specimen Description   Final    IN/OUT CATH URINE Performed at [facility], [address], [place], [place] [address]    Special Requests   Final    NONE Performed at [facility], [address] [place], [place] [address]    Culture (A)  Final    >=  100,000 COLONIES/mL MULTIPLE SPECIES PRESENT, SUGGEST RECOLLECTION   Report Status 06/26/2021 FINAL  Final  MRSA Next Gen by PCR, Nasal     Status: Abnormal   Collection Time: 06/24/21  9:47 PM   Specimen: Nasal Mucosa; Nasal Swab  Result Value Ref Range Status   MRSA by PCR Next Gen DETECTED (A) NOT DETECTED Final    Comment: RESULT CALLED TO, READ BACK BY AND VERIFIED WITH: RN S MORDUE AT 2351 06/24/21 CRUICKSHANK A (NOTE) The GeneXpert MRSA Assay (FDA approved for NASAL specimens only), is one component of a comprehensive MRSA colonization surveillance program. It is not intended to diagnose MRSA infection nor to guide or monitor treatment for MRSA infections. Test performance is not FDA approved in patients less than 48 years old. Performed at [facility], [address] [place], [place] [address]   Aerobic Culture w Gram Stain (superficial specimen)     Status: None   Collection  Time: 06/25/21  1:05 PM   Specimen: Leg  Result Value Ref Range Status   Specimen Description   Final    LEG LEFT Performed at [facility], [address], [place], [place] [address]    Special Requests   Final    NONE Performed at [facility], [address], [place], [place] [address]    Gram Stain   Final    RARE WBC PRESENT, PREDOMINANTLY PMN NO ORGANISMS SEEN Performed at [facility], [address], [place], [place] [address]    Culture   Final    FEW METHICILLIN RESISTANT STAPHYLOCOCCUS AUREUS RARE ACHROMOBACTER XYLOSOXIDANS    Report Status 06/28/2021 FINAL  Final   Organism ID, Bacteria METHICILLIN RESISTANT STAPHYLOCOCCUS AUREUS  Final   Organism ID, Bacteria ACHROMOBACTER XYLOSOXIDANS  Final      Susceptibility   Methicillin resistant staphylococcus aureus - MIC*    CIPROFLOXACIN >=8 RESISTANT Resistant     ERYTHROMYCIN >=8 RESISTANT Resistant     GENTAMICIN <=0.5 SENSITIVE Sensitive     OXACILLIN >=4 RESISTANT Resistant     TETRACYCLINE <=1 SENSITIVE Sensitive     VANCOMYCIN <=0.5 SENSITIVE Sensitive     TRIMETH/SULFA <=10 SENSITIVE Sensitive     CLINDAMYCIN <=0.25 SENSITIVE Sensitive     RIFAMPIN <=0.5 SENSITIVE Sensitive     Inducible Clindamycin NEGATIVE Sensitive     * FEW METHICILLIN RESISTANT STAPHYLOCOCCUS AUREUS   Achromobacter xylosoxidans - MIC*    CEFAZOLIN >=64 RESISTANT Resistant     GENTAMICIN >=16 RESISTANT Resistant     CIPROFLOXACIN >=4 RESISTANT Resistant     IMIPENEM 2 SENSITIVE Sensitive     TRIMETH/SULFA <=20 SENSITIVE Sensitive     * RARE ACHROMOBACTER XYLOSOXIDANS  Aerobic Culture w Gram Stain (superficial specimen)     Status: None   Collection Time: 06/25/21  1:05 PM   Specimen: Toe  Result Value Ref Range Status   Specimen Description   Final    TOE LEFT INTERDIGITAL Performed at [facility], [address], [place], [place] [address]    Special Requests   Final     NONE Performed at [facility], [address], [place], [place] [address]    Gram Stain NO WBC SEEN FEW GRAM POSITIVE COCCI   Final   Culture   Final    ABUNDANT METHICILLIN RESISTANT STAPHYLOCOCCUS AUREUS WITHIN MIXED ORGANISMS Performed at [facility], [address] [place], [place] [address]    Report Status 06/30/2021  FINAL  Final   Organism ID, Bacteria METHICILLIN RESISTANT STAPHYLOCOCCUS AUREUS  Final      Susceptibility   Methicillin resistant staphylococcus aureus - MIC*    CIPROFLOXACIN >=8 RESISTANT Resistant     ERYTHROMYCIN >=8 RESISTANT Resistant     GENTAMICIN <=0.5 SENSITIVE Sensitive     OXACILLIN >=4 RESISTANT Resistant     TETRACYCLINE <=1 SENSITIVE Sensitive     VANCOMYCIN 1 SENSITIVE Sensitive     TRIMETH/SULFA <=10 SENSITIVE Sensitive     CLINDAMYCIN <=0.25 SENSITIVE Sensitive     RIFAMPIN <=0.5 SENSITIVE Sensitive     Inducible Clindamycin NEGATIVE Sensitive     * ABUNDANT METHICILLIN RESISTANT STAPHYLOCOCCUS AUREUS    Radiology Studies: DG HIPS BILAT WITH PELVIS 2V  Result Date: 06/29/2021 CLINICAL DATA:  Recent fall with bilateral hip pain, initial encounter EXAM: DG HIP (WITH OR WITHOUT PELVIS) 2V BILAT COMPARISON:  None. FINDINGS: Pelvic ring is intact. No acute fracture or dislocation is noted. Mild degenerative changes of the hip joints are seen. IMPRESSION: Degenerative change without acute abnormality. Electronically Signed   By: Alcide CleverMark  Lukens M.D.   On: 06/29/2021 19:49       Deklin Bieler T. Evyn Kooyman [facility]  If 7PM-7AM, please contact night-coverage [email or web address] 06/30/2021, 4:24 PM

## 2021-06-30 NOTE — TOC Progression Note (Signed)
Transition of Care [facility]) - Progression Note    Patient Details  Name: [name] MRN: [id number] Date of Birth: Oct 29, 1941  Transition of [facility]) CM/SW Contact  Liliana Cline, [place] Phone Number: 06/30/2021, 3:59 PM  Clinical Narrative:    CSW provided husband with SNF bed offers today at bedside. He is leaning towards [facility]Pt's husband asking to speak with MD to get an update on her overall condition, progress and diagnoses; CSW has alerted Dr Alanda Slim of this request for follow up.      Expected Discharge Plan: Skilled Nursing Facility Barriers to Discharge: Continued Medical Work up  Expected Discharge Plan and Services Expected Discharge Plan: Skilled Nursing Facility   Discharge Planning Services: CM Consult   Living arrangements for the past 2 months: Single Family Home                    Social Determinants of Health (SDOH) Interventions    Readmission Risk Interventions No flowsheet data found.

## 2021-07-01 ENCOUNTER — Other Ambulatory Visit: Payer: Self-pay

## 2021-07-01 LAB — GLUCOSE, CAPILLARY
Glucose-Capillary: 102 mg/dL — ABNORMAL HIGH (ref 70–99)
Glucose-Capillary: 111 mg/dL — ABNORMAL HIGH (ref 70–99)
Glucose-Capillary: 121 mg/dL — ABNORMAL HIGH (ref 70–99)
Glucose-Capillary: 84 mg/dL (ref 70–99)
Glucose-Capillary: 90 mg/dL (ref 70–99)
Glucose-Capillary: 95 mg/dL (ref 70–99)
Glucose-Capillary: 99 mg/dL (ref 70–99)

## 2021-07-01 LAB — RESP PANEL BY RT-PCR (FLU A&B, COVID) ARPGX2
Influenza A by PCR: NEGATIVE
Influenza B by PCR: NEGATIVE
SARS Coronavirus 2 by RT PCR: NEGATIVE

## 2021-07-01 MED ORDER — SENNOSIDES-DOCUSATE SODIUM 8.6-50 MG PO TABS: 1.0000 | ORAL_TABLET | Freq: Two times a day (BID) | ORAL | 0 refills | Status: DC | PRN

## 2021-07-01 MED ORDER — ENSURE ENLIVE PO LIQD: 237.0000 mL | ORAL | 12 refills | Status: DC

## 2021-07-01 MED ORDER — OXYCODONE HCL 10 MG PO TABS: 10.0000 mg | ORAL_TABLET | ORAL | 0 refills | Status: DC | PRN

## 2021-07-01 MED ORDER — CEFDINIR 300 MG PO CAPS: 300.0000 mg | ORAL_CAPSULE | Freq: Two times a day (BID) | ORAL | 0 refills | Status: DC

## 2021-07-01 MED ORDER — NYSTATIN-TRIAMCINOLONE 100000-0.1 UNIT/GM-% EX CREA: TOPICAL_CREAM | Freq: Two times a day (BID) | CUTANEOUS | 0 refills | Status: AC

## 2021-07-01 MED ORDER — SILVER SULFADIAZINE 1 % EX CREA: TOPICAL_CREAM | Freq: Two times a day (BID) | CUTANEOUS | 0 refills | Status: DC

## 2021-07-01 MED ORDER — GABAPENTIN 100 MG PO CAPS: 200.0000 mg | ORAL_CAPSULE | Freq: Three times a day (TID) | ORAL | Status: AC

## 2021-07-01 MED ORDER — LINEZOLID 600 MG PO TABS: 600.0000 mg | ORAL_TABLET | Freq: Two times a day (BID) | ORAL | 0 refills | Status: DC

## 2021-07-01 MED ORDER — POLYETHYLENE GLYCOL 3350 17 G PO PACK: 17.0000 g | PACK | Freq: Every day | ORAL | 0 refills | Status: DC | PRN

## 2021-07-01 MED ORDER — BETHANECHOL CHLORIDE 10 MG PO TABS: 10.0000 mg | ORAL_TABLET | Freq: Three times a day (TID) | ORAL | 0 refills | Status: DC

## 2021-07-01 MED ORDER — ACETAMINOPHEN 325 MG PO TABS: 650.0000 mg | ORAL_TABLET | Freq: Four times a day (QID) | ORAL | Status: DC | PRN

## 2021-07-01 NOTE — Progress Notes (Signed)
PROGRESS NOTE  [name]:[id number] DOB: 03-Nov-1941   PCP: Gwenlyn Found, MD  Patient is from: Home  DOA: 06/24/2021 LOS: 7  Chief complaints:  Chief Complaint  Patient presents with   Fall   Weakness     Brief Narrative / Interim history: 80 year old M with PMH of renal cystitis/ulcer followed at wound clinic, CKD-3B, asthma/COPD, neuropathy, HTN, osteoarthritis, anxiety, depression, RLS, diminished hearing and GERD presenting with generalized weakness, fall at home, increased leg pain, ulceration and drainage, and admitted for severe sepsis due to LLE cellulitis, AKI, hyperkalemia and acute urinary retention.  Cultures obtained.  Patient was started on IV ceftriaxone, IV vancomycin and IV fluid.  Wound care consulted.   Patient is improving.  Blood culture NGTD.  Urine culture with multiple species.  Superficial wound culture from left leg with MRSA and Achromobacter xylosoxidans.  Superficial wound culture from foot with few GPC's.  Remains on IV fluid, CTX and vancomycin.  Infectious disease consulted, and changed antibiotics to cefdinir and Zyvox.  Getting daily wound care.  Medically stable for discharge to SNF pending insurance authorization.    Subjective: Seen and examined earlier this morning.  No major events overnight of this morning.  Some pain in her hips but not in her legs.  She denies chest pain, shortness of breath or GI symptoms.  Objective: Vitals:   06/30/21 2116 07/01/21 0514 07/01/21 0821 07/01/21 1247  BP: 134/74 132/70  128/74  Pulse: 95 (!) 103  93  Resp: 20 20  16   Temp: 98 F (36.7 C) 99 F (37.2 C)  97.7 F (36.5 C)  TempSrc:      SpO2: 100% 92% 93% 97%  Weight:  92 kg    Height:        Intake/Output Summary (Last 24 hours) at 07/01/2021 1431 Last data filed at 07/01/2021 0900 Gross per 24 hour  Intake 360 ml  Output 1001 ml  Net -641 ml   Filed Weights   06/28/21 0640 06/29/21 0500 07/01/21 0514  Weight: 93.7 kg 90.8 kg 92 kg     Examination:  GENERAL: No apparent distress.  Nontoxic. HEENT: MMM.  Vision and hearing grossly intact.  NECK: Supple.  No apparent JVD.  RESP:  No IWOB.  Fair aeration bilaterally. CVS:  RRR. Heart sounds normal.  ABD/GI/GU: BS+. Abd soft, NTND.  MSK/EXT:  Moves extremities. No apparent deformity.  Ace wrap over BLE. SKIN: Ace wrap over BLE.  Skin rash improved. NEURO: Awake and alert. Oriented appropriately.  No apparent focal neuro deficit. PSYCH: Calm. Normal affect.   Procedures:  None  Microbiology summarized: COVID-19 and influenza PCR nonreactive. MRSA PCR screen positive. Blood cultures NGTD. Urine culture with multiple species. Superficial wound culture from left foot with GPC's Superficial wound culture from left leg with MRSA and Achromobacter xylosoxidans.   Assessment & Plan: Severe sepsis in the setting of LLE cellulitis: POA.  Recently started on p.o. doxycycline at wound clinic. Had significant leukocytosis with tachycardia, lactic acidosis and AKI on presentation.   MRSA PCR screen positive.  Sepsis physiology resolved.  Tachycardia could be due to her theophylline but improved.  No osseous abnormality on x-ray.  Patient's tendency to bleed during dressing change and with legs down makes PVD less likely.  She definitely has significant venous insufficiency.  Cultures as above. -Vancomycin and CTX 8/1-8/5>> cefdinir and Zyvox 8/5-8/12 -Encourage leg elevation -Continue daily dressing.  AKI/azotemia: thought to have underlying CKD 3B which might not be the case.  AKI likely due to sepsis, diuretics, ARB, NSAID use and rhabdo.  Resolved and stable off IV fluid Recent Labs    06/24/21 1559 06/24/21 2206 06/25/21 0243 06/26/21 0242 06/27/21 0457 06/28/21 0442 06/29/21 0528  BUN 98* 80* 73* 47* 29* 23 19  CREATININE 4.66* 3.83* 3.19* 1.73* 1.22* 0.99 0.99  -Renal US negative. -Avoid or minimize nephrotoxic meds  Elevated anion gap metabolic acidosis:  Likely due to azotemia.  Resolved.  Generalized weakness/fall at home/traumatic rhabdomyolysis: CK elevated to 1100> 643> 247.  CT head without acute finding. No significant injury.  -PT/OT eval-recommended SNF.   Acute urinary retention-reportedly had about 628 cc on bladder scan prevoid and Foley catheter was inserted.  Renal US without acute finding.  Failed voiding trial on 8/2. -Continue bethanechol -Voiding trial today  Normocytic anemia: Anemia panel within normal.  H&H relatively stable. Recent Labs    06/24/21 1559 06/24/21 2206 06/25/21 0243 06/26/21 0242 06/27/21 0457 06/28/21 0442 06/29/21 0528  HGB 14.8 13.1 12.8 12.1 11.4* 11.7* 11.6*  -Recheck CBC as needed   Hypokalemia/hypophosphatemia: Resolved.   Hyperkalemia: Resolved.  Hyponatremia: Resolved.   Elevated AST: Likely due to rhabdo.  Resolved.   Neuropathic pain: Denies pain today. -Stable on gabapentin 200 mg 3 times daily.   Sinus tachycardia/hypertension: Normotensive.  Tachycardia resolved. -Continue holding Lasix and losartan   GERD -Continue Protonix  Depression and Anxiety: Stable -Continue bupropion 150 mg p.o. daily  Asthma and COPD: Stable. -Continue with theophylline and Advair substitution -Continue monitor respiratory status carefully and if necessary will place on nebs  Pruritic skin rash-erythematous pruritic skin rash over her back and arms. Improved  -Continue Mycolog cream twice daily  Obesity Body mass index is 34.81 kg/m. Nutrition Problem: Increased nutrient needs Etiology: acute illness, wound healing Signs/Symptoms: estimated needs Interventions: MVI, Ensure Enlive (each supplement provides 350kcal and 20 grams of protein), Prostat   DVT prophylaxis:  heparin injection 5,000 Units Start: 06/25/21 0600  Code Status: DNR/DNI Family Communication: Patient and/or RN.  Attempted to call patient's husband but no answer again. Level of care: Med-Surg. Status is:  Inpatient  Remains inpatient appropriate because:Unsafe d/c plan  Dispo: The patient is from: Home              Anticipated d/c is to:  SNF              Patient currently is medically stable to d/c.   Difficult to place patient No       Consultants:  Infectious disease-signed off   Sch Meds:  Scheduled Meds:  (feeding supplement) PROSource Plus  30 mL Oral BID BM   bethanechol  10 mg Oral TID   buPROPion  150 mg Oral Daily   cefdinir  300 mg Oral Q12H   chlorhexidine  15 mL Mouth Rinse BID   Chlorhexidine Gluconate Cloth  6 each Topical Daily   feeding supplement  237 mL Oral Q24H   gabapentin  200 mg Oral TID   heparin  5,000 Units Subcutaneous Q8H   linezolid  600 mg Oral Q12H   mouth rinse  15 mL Mouth Rinse q12n4p   mometasone-formoterol  2 puff Inhalation BID   multivitamin with minerals  1 tablet Oral Daily   nystatin-triamcinolone   Topical BID   pantoprazole  40 mg Oral Daily   silver sulfADIAZINE   Topical BID   theophylline  400 mg Oral Daily   Continuous Infusions:   PRN Meds:.acetaminophen **OR** acetaminophen, diphenhydrAMINE, hydrALAZINE, HYDROmorphone (DILAUDID)  injection, levalbuterol, nystatin, ondansetron **OR** ondansetron (ZOFRAN) IV, oxyCODONE, polyethylene glycol  Antimicrobials: Anti-infectives (From admission, onward)    Start     Dose/Rate Route Frequency Ordered Stop   06/28/21 2200  linezolid (ZYVOX) tablet 600 mg        600 mg Oral Every 12 hours 06/28/21 1511 07/06/21 0959   06/28/21 1600  cefdinir (OMNICEF) capsule 300 mg        300 mg Oral Every 12 hours 06/28/21 1511 07/06/21 0959   06/27/21 1800  vancomycin (VANCOREADY) IVPB 750 mg/150 mL  Status:  Discontinued        750 mg 150 mL/hr over 60 Minutes Intravenous Every 24 hours 06/27/21 0811 06/28/21 1511   06/26/21 1800  vancomycin (VANCOREADY) IVPB 500 mg/100 mL  Status:  Discontinued        500 mg 100 mL/hr over 60 Minutes Intravenous Every 48 hours 06/24/21 1917 06/26/21 0836    06/26/21 1800  vancomycin (VANCOCIN) IVPB 1000 mg/200 mL premix  Status:  Discontinued        1,000 mg 200 mL/hr over 60 Minutes Intravenous Every 48 hours 06/26/21 0836 06/27/21 0811   06/25/21 1600  cefTRIAXone (ROCEPHIN) 2 g in sodium chloride 0.9 % 100 mL IVPB  Status:  Discontinued        2 g 200 mL/hr over 30 Minutes Intravenous Every 24 hours 06/24/21 2059 06/28/21 1511   06/24/21 2245  cefTRIAXone (ROCEPHIN) 2 g in sodium chloride 0.9 % 100 mL IVPB  Status:  Discontinued        2 g 200 mL/hr over 30 Minutes Intravenous  Once 06/24/21 2145 06/24/21 2149   06/24/21 2245  cefTRIAXone (ROCEPHIN) 2 g in sodium chloride 0.9 % 100 mL IVPB  Status:  Discontinued        2 g 200 mL/hr over 30 Minutes Intravenous Every 24 hours 06/24/21 2145 06/24/21 2149   06/24/21 2100  cefTRIAXone (ROCEPHIN) 2 g in sodium chloride 0.9 % 100 mL IVPB  Status:  Discontinued        2 g 200 mL/hr over 30 Minutes Intravenous Every 24 hours 06/24/21 2059 06/24/21 2100   06/24/21 1530  vancomycin (VANCOCIN) IVPB 1000 mg/200 mL premix        1,000 mg 200 mL/hr over 60 Minutes Intravenous  Once 06/24/21 1520 06/24/21 1854   06/24/21 1530  cefTRIAXone (ROCEPHIN) 2 g in sodium chloride 0.9 % 100 mL IVPB        2 g 200 mL/hr over 30 Minutes Intravenous  Once 06/24/21 1520 06/24/21 1650        I have personally reviewed the following labs and images: CBC: Recent Labs  Lab 06/24/21 1559 06/24/21 2206 06/25/21 0243 06/26/21 0242 06/27/21 0457 06/28/21 0442 06/29/21 0528  WBC 22.0*   < > 15.6* 10.3 9.2 10.6* 9.9  NEUTROABS 19.3*  --   --  7.4  --   --   --   HGB 14.8   < > 12.8 12.1 11.4* 11.7* 11.6*  HCT 44.6   < > 38.1 38.1 35.3* 37.1 35.9*  MCV 99.3   < > 98.2 102.1* 101.4* 101.6* 100.8*  PLT 414*   < > 303 297 295 288 297   < > = values in this interval not displayed.   BMP &GFR Recent Labs  Lab 06/24/21 2206 06/25/21 0243 06/26/21 0242 06/27/21 0457 06/28/21 0442 06/29/21 0528  NA 134*  137 138 139 141 138  K 4.3 4.2 3.2* 4.0 3.7  3.6  CL 98 105 110 113* 115* 111  CO2 20* 20* 17* 20* 20* 23  GLUCOSE 100* 90 82 102* 98 94  BUN 80* 73* 47* 29* 23 19  CREATININE 3.83* 3.19* 1.73* 1.22* 0.99 0.99  CALCIUM 9.2 9.1 8.8* 8.8* 8.8* 8.5*  MG 2.6*  --  2.2 2.0 1.9 2.1  PHOS 5.9*  --  3.6 1.4* 1.8* 2.4*   Estimated Creatinine Clearance: 49.8 mL/min (by C-G formula based on SCr of 0.99 mg/dL). Liver & Pancreas: Recent Labs  Lab 06/24/21 2206 06/25/21 0243 06/26/21 0242 06/27/21 0457 06/28/21 0442 06/29/21 0528  AST 61* 65* 60*  --  42* 34  ALT 29 28 33  --  37 35  ALKPHOS 79 74 75  --  62 61  BILITOT 0.8 0.7 0.8  --  0.6 0.8  PROT 6.6 6.4* 5.9*  --  5.4* 5.4*  ALBUMIN 2.8* 2.5* 2.4* 2.2* 2.3* 2.3*   No results for input(s): LIPASE, AMYLASE in the last 168 hours. Recent Labs  Lab 06/26/21 1442  AMMONIA 17   Diabetic: No results for input(s): HGBA1C in the last 72 hours.  Recent Labs  Lab 06/30/21 2043 07/01/21 0014 07/01/21 0511 07/01/21 0734 07/01/21 1110  GLUCAP 123* 99 102* 84 111*   Cardiac Enzymes: Recent Labs  Lab 06/25/21 0728 06/26/21 0242 06/27/21 0457 06/28/21 0442 06/29/21 0528  CKTOTAL 1,100* 643* 402* 247* 190   No results for input(s): PROBNP in the last 8760 hours. Coagulation Profile: Recent Labs  Lab 06/24/21 1559 06/30/21 0416  INR 1.0 1.0   Thyroid Function Tests: No results for input(s): TSH, T4TOTAL, FREET4, T3FREE, THYROIDAB in the last 72 hours.  Lipid Profile: No results for input(s): CHOL, HDL, LDLCALC, TRIG, CHOLHDL, LDLDIRECT in the last 72 hours. Anemia Panel: Recent Labs    06/30/21 0416  VITAMINB12 821  FOLATE 28.1  FERRITIN 175  TIBC 152*  IRON 52  RETICCTPCT 2.4    Urine analysis:    Component Value Date/Time   COLORURINE YELLOW 06/24/2021 2100   APPEARANCEUR HAZY (A) 06/24/2021 2100   LABSPEC 1.017 06/24/2021 2100   PHURINE 6.0 06/24/2021 2100   GLUCOSEU NEGATIVE 06/24/2021 2100   HGBUR  MODERATE (A) 06/24/2021 2100   BILIRUBINUR NEGATIVE 06/24/2021 2100   KETONESUR NEGATIVE 06/24/2021 2100   PROTEINUR 100 (A) 06/24/2021 2100   NITRITE NEGATIVE 06/24/2021 2100   LEUKOCYTESUR MODERATE (A) 06/24/2021 2100   Sepsis Labs: Invalid input(s): PROCALCITONIN, LACTICIDVEN  Microbiology: Recent Results (from the past 240 hour(s))  Blood Culture (routine x 2)     Status: None   Collection Time: 06/24/21  3:48 PM   Specimen: BLOOD  Result Value Ref Range Status   Specimen Description   Final    BLOOD LEFT ANTECUBITAL Performed at [facility], [address] [place], [place] [address]    Special Requests   Final    BOTTLES DRAWN AEROBIC AND ANAEROBIC Blood Culture adequate volume Performed at [facility], [address] [place], [place] [address]    Culture   Final    NO GROWTH 5 DAYS Performed at [facility], [address] [place], [place] [address]    Report Status 06/29/2021 FINAL  Final  Blood Culture (routine x 2)     Status: None   Collection Time: 06/24/21  3:48 PM   Specimen: BLOOD  Result Value Ref Range Status   Specimen Description   Final    BLOOD RIGHT  ANTECUBITAL Performed at [facility], [address], [place], [place] [address]    Special Requests   Final    BOTTLES DRAWN AEROBIC AND ANAEROBIC Blood Culture adequate volume Performed at [facility], [address], [place], [place] [address]    Culture   Final    NO GROWTH 5 DAYS Performed at [facility], [address] [place], [place] [address]    Report Status 06/29/2021 FINAL  Final  Resp Panel by RT-PCR (Flu A&B, Covid) Nasopharyngeal Swab     Status: None   Collection Time: 06/24/21  3:51 PM   Specimen: Nasopharyngeal Swab; Nasopharyngeal(NP) swabs in vial transport medium  Result Value Ref Range Status   SARS Coronavirus 2 by RT PCR NEGATIVE NEGATIVE Final    Comment: (NOTE) SARS-CoV-2 target  nucleic acids are NOT DETECTED.  The SARS-CoV-2 RNA is generally detectable in upper respiratory specimens during the acute phase of infection. The lowest concentration of SARS-CoV-2 viral copies this assay can detect is 138 copies/mL. A negative result does not preclude SARS-Cov-2 infection and should not be used as the sole basis for treatment or other patient management decisions. A negative result may occur with  improper specimen collection/handling, submission of specimen other than nasopharyngeal swab, presence of viral mutation(s) within the areas targeted by this assay, and inadequate number of viral copies(<138 copies/mL). A negative result must be combined with clinical observations, patient history, and epidemiological information. The expected result is Negative.  Fact Sheet for Patients:  BloggerCourse.com  Fact Sheet for Healthcare Providers:  SeriousBroker.it  This test is no t yet approved or cleared by the [place] FDA and  has been authorized for detection and/or diagnosis of SARS-CoV-2 by FDA under an Emergency Use Authorization (EUA). This EUA will remain  in effect (meaning this test can be used) for the duration of the COVID-19 declaration under Section 564(b)(1) of the Act, 21 U.S.C.section 360bbb-3(b)(1), unless the authorization is terminated  or revoked sooner.       Influenza A by PCR NEGATIVE NEGATIVE Final   Influenza B by PCR NEGATIVE NEGATIVE Final    Comment: (NOTE) The Xpert Xpress SARS-CoV-2/FLU/RSV plus assay is intended as an aid in the diagnosis of influenza from Nasopharyngeal swab specimens and should not be used as a sole basis for treatment. Nasal washings and aspirates are unacceptable for Xpert Xpress SARS-CoV-2/FLU/RSV testing.  Fact Sheet for Patients: BloggerCourse.com  Fact Sheet for Healthcare  Providers: SeriousBroker.it  This test is not yet approved or cleared by the [place] FDA and has been authorized for detection and/or diagnosis of SARS-CoV-2 by FDA under an Emergency Use Authorization (EUA). This EUA will remain in effect (meaning this test can be used) for the duration of the COVID-19 declaration under Section 564(b)(1) of the Act, 21 U.S.C. section 360bbb-3(b)(1), unless the authorization is terminated or revoked.  Performed at [facility], [address] [place], [place] [address]   Urine Culture     Status: Abnormal   Collection Time: 06/24/21  9:00 PM   Specimen: In/Out Cath Urine  Result Value Ref Range Status   Specimen Description   Final    IN/OUT CATH URINE Performed at [facility], [address], [place], [place] [address]    Special Requests   Final    NONE Performed at [facility], [address], [place], [place] [address]    Culture (A)  Final    >=  100,000 COLONIES/mL MULTIPLE SPECIES PRESENT, SUGGEST RECOLLECTION   Report Status 06/26/2021 FINAL  Final  MRSA Next Gen by PCR, Nasal     Status: Abnormal   Collection Time: 06/24/21  9:47 PM   Specimen: Nasal Mucosa; Nasal Swab  Result Value Ref Range Status   MRSA by PCR Next Gen DETECTED (A) NOT DETECTED Final    Comment: RESULT CALLED TO, READ BACK BY AND VERIFIED WITH: RN S MORDUE AT 2351 06/24/21 CRUICKSHANK A (NOTE) The GeneXpert MRSA Assay (FDA approved for NASAL specimens only), is one component of a comprehensive MRSA colonization surveillance program. It is not intended to diagnose MRSA infection nor to guide or monitor treatment for MRSA infections. Test performance is not FDA approved in patients less than 48 years old. Performed at [facility], [address] [place], [place] [address]   Aerobic Culture w Gram Stain (superficial specimen)     Status: None   Collection  Time: 06/25/21  1:05 PM   Specimen: Leg  Result Value Ref Range Status   Specimen Description   Final    LEG LEFT Performed at [facility], [address], [place], [place] [address]    Special Requests   Final    NONE Performed at [facility], [address], [place], [place] [address]    Gram Stain   Final    RARE WBC PRESENT, PREDOMINANTLY PMN NO ORGANISMS SEEN Performed at [facility], [address], [place], [place] [address]    Culture   Final    FEW METHICILLIN RESISTANT STAPHYLOCOCCUS AUREUS RARE ACHROMOBACTER XYLOSOXIDANS    Report Status 06/28/2021 FINAL  Final   Organism ID, Bacteria METHICILLIN RESISTANT STAPHYLOCOCCUS AUREUS  Final   Organism ID, Bacteria ACHROMOBACTER XYLOSOXIDANS  Final      Susceptibility   Methicillin resistant staphylococcus aureus - MIC*    CIPROFLOXACIN >=8 RESISTANT Resistant     ERYTHROMYCIN >=8 RESISTANT Resistant     GENTAMICIN <=0.5 SENSITIVE Sensitive     OXACILLIN >=4 RESISTANT Resistant     TETRACYCLINE <=1 SENSITIVE Sensitive     VANCOMYCIN <=0.5 SENSITIVE Sensitive     TRIMETH/SULFA <=10 SENSITIVE Sensitive     CLINDAMYCIN <=0.25 SENSITIVE Sensitive     RIFAMPIN <=0.5 SENSITIVE Sensitive     Inducible Clindamycin NEGATIVE Sensitive     * FEW METHICILLIN RESISTANT STAPHYLOCOCCUS AUREUS   Achromobacter xylosoxidans - MIC*    CEFAZOLIN >=64 RESISTANT Resistant     GENTAMICIN >=16 RESISTANT Resistant     CIPROFLOXACIN >=4 RESISTANT Resistant     IMIPENEM 2 SENSITIVE Sensitive     TRIMETH/SULFA <=20 SENSITIVE Sensitive     * RARE ACHROMOBACTER XYLOSOXIDANS  Aerobic Culture w Gram Stain (superficial specimen)     Status: None   Collection Time: 06/25/21  1:05 PM   Specimen: Toe  Result Value Ref Range Status   Specimen Description   Final    TOE LEFT INTERDIGITAL Performed at [facility], [address], [place], [place] [address]    Special Requests   Final     NONE Performed at [facility], [address], [place], [place] [address]    Gram Stain NO WBC SEEN FEW GRAM POSITIVE COCCI   Final   Culture   Final    ABUNDANT METHICILLIN RESISTANT STAPHYLOCOCCUS AUREUS WITHIN MIXED ORGANISMS Performed at [facility], [address] [place], [place] [address]    Report Status 06/30/2021  FINAL  Final   Organism ID, Bacteria METHICILLIN RESISTANT STAPHYLOCOCCUS AUREUS  Final      Susceptibility   Methicillin resistant staphylococcus aureus - MIC*    CIPROFLOXACIN >=8 RESISTANT Resistant     ERYTHROMYCIN >=8 RESISTANT Resistant     GENTAMICIN <=0.5 SENSITIVE Sensitive     OXACILLIN >=4 RESISTANT Resistant     TETRACYCLINE <=1 SENSITIVE Sensitive     VANCOMYCIN 1 SENSITIVE Sensitive     TRIMETH/SULFA <=10 SENSITIVE Sensitive     CLINDAMYCIN <=0.25 SENSITIVE Sensitive     RIFAMPIN <=0.5 SENSITIVE Sensitive     Inducible Clindamycin NEGATIVE Sensitive     * ABUNDANT METHICILLIN RESISTANT STAPHYLOCOCCUS AUREUS  Resp Panel by RT-PCR (Flu A&B, Covid) Nasopharyngeal Swab     Status: None   Collection Time: 07/01/21  9:45 AM   Specimen: Nasopharyngeal Swab; Nasopharyngeal(NP) swabs in vial transport medium  Result Value Ref Range Status   SARS Coronavirus 2 by RT PCR NEGATIVE NEGATIVE Final    Comment: (NOTE) SARS-CoV-2 target nucleic acids are NOT DETECTED.  The SARS-CoV-2 RNA is generally detectable in upper respiratory specimens during the acute phase of infection. The lowest concentration of SARS-CoV-2 viral copies this assay can detect is 138 copies/mL. A negative result does not preclude SARS-Cov-2 infection and should not be used as the sole basis for treatment or other patient management decisions. A negative result may occur with  improper specimen collection/handling, submission of specimen other than nasopharyngeal swab, presence of viral mutation(s) within the areas targeted by this assay, and inadequate  number of viral copies(<138 copies/mL). A negative result must be combined with clinical observations, patient history, and epidemiological information. The expected result is Negative.  Fact Sheet for Patients:  BloggerCourse.com  Fact Sheet for Healthcare Providers:  SeriousBroker.it  This test is no t yet approved or cleared by the [place] FDA and  has been authorized for detection and/or diagnosis of SARS-CoV-2 by FDA under an Emergency Use Authorization (EUA). This EUA will remain  in effect (meaning this test can be used) for the duration of the COVID-19 declaration under Section 564(b)(1) of the Act, 21 U.S.C.section 360bbb-3(b)(1), unless the authorization is terminated  or revoked sooner.       Influenza A by PCR NEGATIVE NEGATIVE Final   Influenza B by PCR NEGATIVE NEGATIVE Final    Comment: (NOTE) The Xpert Xpress SARS-CoV-2/FLU/RSV plus assay is intended as an aid in the diagnosis of influenza from Nasopharyngeal swab specimens and should not be used as a sole basis for treatment. Nasal washings and aspirates are unacceptable for Xpert Xpress SARS-CoV-2/FLU/RSV testing.  Fact Sheet for Patients: BloggerCourse.com  Fact Sheet for Healthcare Providers: SeriousBroker.it  This test is not yet approved or cleared by the [place] FDA and has been authorized for detection and/or diagnosis of SARS-CoV-2 by FDA under an Emergency Use Authorization (EUA). This EUA will remain in effect (meaning this test can be used) for the duration of the COVID-19 declaration under Section 564(b)(1) of the Act, 21 U.S.C. section 360bbb-3(b)(1), unless the authorization is terminated or revoked.  Performed at [facility], [address] [place], [place] [address]     Radiology Studies: No results found.     Aundre Hietala [facility]  If  7PM-7AM, please contact night-coverage [email or web address] 07/01/2021, 2:31 PM

## 2021-07-02 DIAGNOSIS — G8929 Other chronic pain: Secondary | ICD-10-CM

## 2021-07-02 DIAGNOSIS — M545 Low back pain, unspecified: Secondary | ICD-10-CM

## 2021-07-02 DIAGNOSIS — G629 Polyneuropathy, unspecified: Secondary | ICD-10-CM

## 2021-07-02 DIAGNOSIS — R609 Edema, unspecified: Secondary | ICD-10-CM

## 2021-07-02 DIAGNOSIS — M8949 Other hypertrophic osteoarthropathy, multiple sites: Secondary | ICD-10-CM

## 2021-07-02 LAB — GLUCOSE, CAPILLARY
Glucose-Capillary: 97 mg/dL (ref 70–99)
Glucose-Capillary: 90 mg/dL (ref 70–99)
Glucose-Capillary: 106 mg/dL — ABNORMAL HIGH (ref 70–99)

## 2021-07-02 NOTE — Care Management Important Message (Signed)
Important Message  Patient Details IM Letter given to the Patient. Name: [name] MRN: [id number] Date of Birth: 12/28/1940   Medicare Important Message Given:  Yes     Caren Macadam 07/02/2021, 11:45 AM

## 2021-07-02 NOTE — Progress Notes (Signed)
Attempted to give report x3 to [facility].

## 2021-07-02 NOTE — Discharge Summary (Addendum)
Physician Discharge Summary  [name] ZOX:[id number] DOB: [birth year] DOA: 06/24/2021  PCP: Gwenlyn Found, MD  Admit date: 06/24/2021 Discharge date: 07/02/2021  Admitted From: Home Disposition:  SNF  Recommendations for Outpatient Follow-up:  Follow ups as below. Please obtain CBC/BMP/Mag at follow up Consider outpatient follow-up with vascular and vein specialist Please follow up on the following pending results: None   Discharge Condition: Stable CODE STATUS: DNR/DNI   Contact information for after-discharge care     Destination     [facility] .   Service: Skilled Nursing Contact information: [address] [place] [address] [phone]                     Hospital Course: 80 year old M with PMH of renal cystitis/ulcer followed at wound clinic, CKD-3B, asthma/COPD, neuropathy, HTN, osteoarthritis, anxiety, depression, RLS, diminished hearing and GERD presenting with generalized weakness, fall at home, increased leg pain, ulceration and drainage, and admitted for severe sepsis due to LLE cellulitis, AKI, hyperkalemia and acute urinary retention.  Cultures obtained.  Patient was started on IV ceftriaxone, IV vancomycin and IV fluid.  Wound care consulted.   Patient is improving.  Blood culture NGTD.  Urine culture with multiple species.  Superficial wound culture from left leg with MRSA and Achromobacter xylosoxidans.  Superficial wound culture from foot with few GPC's.  Remains on IV fluid, CTX and vancomycin.  Infectious disease consulted, and changed antibiotics to cefdinir and Zyvox through 07/05/2021.  Continue daily wound care as below.  AKI, hyperkalemia and acute urinary retention resolved.   See individual problem list below for more hospital course.  Discharge Diagnoses:  Severe sepsis in the setting of LLE cellulitis: POA.  Recently started on p.o. doxycycline at wound clinic. Had significant  leukocytosis with tachycardia, lactic acidosis and AKI on presentation.   MRSA PCR screen positive.  Sepsis physiology resolved.  Tachycardia could be due to her theophylline but improved.  No osseous abnormality on x-ray.  Patient's tendency to bleed during dressing change and with legs down makes PVD less likely.  She definitely has significant venous insufficiency.  Cultures as above. -Vancomycin and CTX 8/1-8/5>> cefdinir and Zyvox 8/5-8/12 -Encourage leg elevation -Continue daily dressing.   AKI/azotemia: thought to have underlying CKD 3B which might not be the case.  AKI likely due to sepsis, diuretics, ARB, NSAID use and rhabdo.  Resolved and stable off IV fluid Recent Labs (within last 365 days)           Recent Labs    06/24/21 1559 06/24/21 2206 06/25/21 0243 06/26/21 0242 06/27/21 0457 06/28/21 0442 06/29/21 0528  BUN 98* 80* 73* 47* 29* 23 19  CREATININE 4.66* 3.83* 3.19* 1.73* 1.22* 0.99 0.99    -Renal US negative. -Avoid or minimize nephrotoxic meds -Discontinue Lasix    Elevated anion gap metabolic acidosis: Likely due to azotemia.  Resolved.   Generalized weakness/fall at home/traumatic rhabdomyolysis: CK elevated to 1100> 643> 247.  CT head without acute finding.  Bilateral hip x-ray without acute finding.  No significant injury. -Continue PT/OT at SNF.   Acute urinary retention-reportedly had about 628 cc on bladder scan prevoid and Foley catheter was inserted.  Renal US without acute finding.  Passed voiding trial on 8/8. -Continue bethanechol   Normocytic anemia: Anemia panel within normal.  H&H relatively stable. Recent Labs (within last 365 days)           Recent Labs  06/24/21 1559 06/24/21 2206 06/25/21 0243 06/26/21 0242 06/27/21 0457 06/28/21 0442 06/29/21 0528  HGB 14.8 13.1 12.8 12.1 11.4* 11.7* 11.6*    -Recheck CBC in 1 week.   Hypokalemia/hypophosphatemia: Resolved.   Hyperkalemia: Resolved.   Hyponatremia: Resolved.   Elevated  AST: Likely due to rhabdo.  Resolved.   Neuropathic pain: Denies pain today. -Stable on gabapentin 200 mg 3 times daily.   Sinus tachycardia/hypertension: Normotensive.  Tachycardia likely due to acute finding.  Resolved.   GERD -Continue Protonix  Depression and Anxiety: Stable -Continue bupropion 150 mg p.o. daily  Asthma and COPD: Stable. -Continue with theophylline, Advair and as needed albuterol -Continue monitor respiratory status carefully and if necessary will place on nebs   Pruritic skin rash-erythematous pruritic skin rash over her back and arms.  Improved with Mycolog cream. -Continue Mycolog cream twice daily for 5 days   Obesity Body mass index is 34.36 kg/m. Nutrition Problem: Increased nutrient needs Etiology: acute illness, wound healing Signs/Symptoms: estimated needs Interventions: MVI, Ensure Enlive (each supplement provides 350kcal and 20 grams of protein), Prostat  BLE venous stasis with ulceration: POA -See wound care instruction below. -Continue leg elevation     Discharge Exam: Vitals:   07/01/21 2027 07/02/21 0424  BP: (!) 143/69 101/86  Pulse: 96 95  Resp: 20 20  Temp: 97.7 F (36.5 C) 97.8 F (36.6 C)  SpO2: 94% 94%    GENERAL: No apparent distress.  Nontoxic. HEENT: MMM.  Vision and hearing grossly intact.  NECK: Supple.  No apparent JVD.  RESP: On RA.  No IWOB.  Fair aeration bilaterally. CVS:  RRR. Heart sounds normal.  ABD/GI/GU: Bowel sounds present. Soft. Non tender.  MSK/EXT:  Moves extremities.  Ace wrap over BLE. SKIN: Ace wrap over BLE. NEURO: Awake, alert and oriented appropriately.  No apparent focal neuro deficit. Weakness in both legs, Left > Right, mainly due to osteoarthritis PSYCH: Calm. Normal affect.   Discharge Instructions  Discharge Instructions     Diet - low sodium heart healthy   Complete by: As directed    Discharge wound care:   Complete by: As directed    Wound care  Every shift      Comments:  Place as many Mepitel as necessary to cover the red, raw area of the LLE (will likely take 8 -10), and 1-2 over the RLE lateral side of the leg. THE MEPITEL DRESSINGS CAN STAY IN PLACE UP TO 5 DAYS BEFORE THEY ARE CHANGED. Then apply a layer of silvadene cream over the Mepitel dressing. Beginning behind the toes and going to just below the knees, spiral wrap kerlix, then 4 inch ace wrap. Perform twice daily. Change the kerlix with each application of Silvadene and the Ace wraps when soiled.   Increase activity slowly   Complete by: As directed       Allergies as of 07/02/2021       Reactions   Meloxicam Rash        Medication List     STOP taking these medications    doxycycline 100 MG capsule Commonly known as: MONODOX   furosemide 40 MG tablet Commonly known as: LASIX   MUCINEX DM PO   potassium chloride SA 20 MEQ tablet Commonly known as: KLOR-CON       TAKE these medications    acetaminophen 325 MG tablet Commonly known as: TYLENOL Take 2 tablets (650 mg total) by mouth every 6 (six) hours as needed for mild pain (or Fever >/=  101).   bethanechol 10 MG tablet Commonly known as: URECHOLINE Take 1 tablet (10 mg total) by mouth 3 (three) times daily.   buPROPion 150 MG 24 hr tablet Commonly known as: WELLBUTRIN XL Take 150 mg by mouth daily.   cefdinir 300 MG capsule Commonly known as: OMNICEF Take 1 capsule (300 mg total) by mouth every 12 (twelve) hours.   CLEAR EYES COMPLETE OP Place 1 drop into both eyes 2 (two) times daily as needed (redness).   diphenhydrAMINE 25 mg capsule Commonly known as: BENADRYL Take 25 mg by mouth daily as needed for itching or allergies.   feeding supplement Liqd Take 237 mLs by mouth daily.   Fluticasone-Salmeterol 500-50 MCG/DOSE Aepb Commonly known as: ADVAIR Inhale 1 puff into the lungs 2 (two) times daily.   gabapentin 100 MG capsule Commonly known as: Neurontin Take 2 capsules (200 mg total) by mouth 3 (three)  times daily. What changed: how much to take   linezolid 600 MG tablet Commonly known as: ZYVOX Take 1 tablet (600 mg total) by mouth every 12 (twelve) hours.   losartan 50 MG tablet Commonly known as: COZAAR Take 25 mg by mouth daily.   MOVE FREE PO Take 1 tablet by mouth daily.   Multi-Vitamins Tabs Take 1 tablet by mouth daily.   nystatin-triamcinolone cream Commonly known as: MYCOLOG II Apply topically 2 (two) times daily for 5 days.   omeprazole 20 MG capsule Commonly known as: PRILOSEC Take 20 mg by mouth daily as needed (heartburn).   Oxycodone HCl 10 MG Tabs Take 1 tablet (10 mg total) by mouth as needed (about an hour before dressing change).   polyethylene glycol 17 g packet Commonly known as: MIRALAX / GLYCOLAX Take 17 g by mouth daily as needed for mild constipation.   senna-docusate 8.6-50 MG tablet Commonly known as: Senokot-S Take 1 tablet by mouth 2 (two) times daily between meals as needed for mild constipation.   silver sulfADIAZINE 1 % cream Commonly known as: SILVADENE Apply topically 2 (two) times daily.   SIMETHICONE PO Take 180 mg by mouth daily as needed (flatulance).   theophylline 400 MG 24 hr tablet Commonly known as: UNIPHYL Take 400 mg by mouth daily.               Discharge Care Instructions  (From admission, onward)           Start     Ordered   07/01/21 0000  Discharge wound care:       Comments: Wound care  Every shift      Comments: Place as many Mepitel as necessary to cover the red, raw area of the LLE (will likely take 8 -10), and 1-2 over the RLE lateral side of the leg. THE MEPITEL DRESSINGS CAN STAY IN PLACE UP TO 5 DAYS BEFORE THEY ARE CHANGED. Then apply a layer of silvadene cream over the Mepitel dressing. Beginning behind the toes and going to just below the knees, spiral wrap kerlix, then 4 inch ace wrap. Perform twice daily. Change the kerlix with each application of Silvadene and the Ace wraps when soiled.    07/01/21 1623            Consultations: Wound care  Procedures/Studies:   DG Tibia/Fibula Left  Result Date: 06/24/2021 CLINICAL DATA:  Infection. Patient reports fall and weakness. Wounds to the lower extremity. EXAM: LEFT TIBIA AND FIBULA - 2 VIEW COMPARISON:  None. FINDINGS: Cortical margins of the tibia and fibula  are intact. There is no evidence of fracture or other focal bone lesions. No periosteal reaction, erosion, or bone destruction. Knee and ankle alignment are maintained. Generalized soft tissue edema. No soft tissue air or radiopaque foreign body. Mild overlying artifact projects over the distal aspect of the lower leg on the lateral view. IMPRESSION: Generalized soft tissue edema. No acute osseous abnormality. Electronically Signed   By: Narda Rutherford M.D.   On: 06/24/2021 16:49   CT Head Wo Contrast  Result Date: 06/24/2021 CLINICAL DATA:  Follow up approximately 12 hours prior. Weakness for a few days. Inability to ambulate due to weakness. EXAM: CT HEAD WITHOUT CONTRAST TECHNIQUE: Contiguous axial images were obtained from the base of the skull through the vertex without intravenous contrast. COMPARISON:  None. FINDINGS: Despite efforts by the technologist and patient, mild motion artifact is present on today's exam and could not be eliminated. This reduces exam sensitivity and specificity. Some images were repeated. Brain: There is no evidence of acute intracranial hemorrhage, mass lesion, brain edema or extra-axial fluid collection. Mild atrophy with mild prominence of the ventricles and subarachnoid spaces. There is mild low-density in the periventricular white matter which likely reflects small vessel ischemic change. There is no CT evidence of acute cortical infarction. Vascular: Mild intracranial atherosclerosis. No hyperdense vessel identified. Skull: Negative for fracture or focal lesion. Sinuses/Orbits: The visualized paranasal sinuses and mastoid air cells are  clear. No orbital abnormalities are seen. Other: Previous bilateral lens surgery. IMPRESSION: 1. No acute intracranial or calvarial findings identified. 2. Mild atrophy and chronic small vessel ischemic changes. Electronically Signed   By: Carey Bullocks M.D.   On: 06/24/2021 16:36   US Renal  Result Date: 06/24/2021 CLINICAL DATA:  Acute kidney injury. EXAM: RENAL / URINARY TRACT ULTRASOUND COMPLETE COMPARISON:  None. FINDINGS: Right Kidney: Renal measurements: 9.2 x 4 x 4 cm = volume: 76 mL. Echogenicity within normal limits. No mass or hydronephrosis visualized. Left Kidney: Renal measurements: 9.2 x 5.3 x 4.5 cm = volume: 114 mL. Echogenicity within normal limits. No mass or hydronephrosis visualized. Urinary bladder: Prevoid volume of 628 mL. Appears normal for degree of bladder distention. Other: Increased hepatic echogenicity. IMPRESSION: 1. Unremarkable bilateral renal ultrasound. 2. Prevoid volume urinary bladder 628 mL.  No postvoid obtained. 3. Hepatic steatosis. Electronically Signed   By: Tish Frederickson M.D.   On: 06/24/2021 19:16   DG Chest Port 1 View  Result Date: 06/24/2021 CLINICAL DATA:  80 year old female evaluation for sepsis EXAM: PORTABLE CHEST - 1 VIEW COMPARISON:  None. FINDINGS: Cardiomegaly. Hypoinflation. No focal consolidation or mass. No pleural effusion or pneumothorax. No acute osseous abnormality. IMPRESSION: No acute cardiopulmonary process. Electronically Signed   By: Roanna Banning MD   On: 06/24/2021 16:44   DG HIPS BILAT WITH PELVIS 2V  Result Date: 06/29/2021 CLINICAL DATA:  Recent fall with bilateral hip pain, initial encounter EXAM: DG HIP (WITH OR WITHOUT PELVIS) 2V BILAT COMPARISON:  None. FINDINGS: Pelvic ring is intact. No acute fracture or dislocation is noted. Mild degenerative changes of the hip joints are seen. IMPRESSION: Degenerative change without acute abnormality. Electronically Signed   By: Alcide Clever M.D.   On: 06/29/2021 19:49       The results  of significant diagnostics from this hospitalization (including imaging, microbiology, ancillary and laboratory) are listed below for reference.     Microbiology: Recent Results (from the past 240 hour(s))  Blood Culture (routine x 2)     Status: None  Collection Time: 06/24/21  3:48 PM   Specimen: BLOOD  Result Value Ref Range Status   Specimen Description   Final    BLOOD LEFT ANTECUBITAL Performed at [facility], [address] [place] [address] [place], [place] [address]    Special Requests   Final    BOTTLES DRAWN AEROBIC AND ANAEROBIC Blood Culture adequate volume Performed at [facility], [address], [place], [place] [address]    Culture   Final    NO GROWTH 5 DAYS Performed at [facility], [address], [place], [place] [address]    Report Status 06/29/2021 FINAL  Final  Blood Culture (routine x 2)     Status: None   Collection Time: 06/24/21  3:48 PM   Specimen: BLOOD  Result Value Ref Range Status   Specimen Description   Final    BLOOD RIGHT ANTECUBITAL Performed at [facility], [address], [place], [place] [address]    Special Requests   Final    BOTTLES DRAWN AEROBIC AND ANAEROBIC Blood Culture adequate volume Performed at [facility], [address], [place], [place] [address]    Culture   Final    NO GROWTH 5 DAYS Performed at [facility], [address] [place], [place] [address]    Report Status 06/29/2021 FINAL  Final  Resp Panel by RT-PCR (Flu A&B, Covid) Nasopharyngeal Swab     Status: None   Collection Time: 06/24/21  3:51 PM   Specimen: Nasopharyngeal Swab; Nasopharyngeal(NP) swabs in vial transport medium  Result Value Ref Range Status   SARS Coronavirus 2 by RT PCR NEGATIVE NEGATIVE Final    Comment: (NOTE) SARS-CoV-2 target nucleic acids are NOT DETECTED.  The SARS-CoV-2 RNA is generally detectable in upper respiratory specimens during the acute phase of  infection. The lowest concentration of SARS-CoV-2 viral copies this assay can detect is 138 copies/mL. A negative result does not preclude SARS-Cov-2 infection and should not be used as the sole basis for treatment or other patient management decisions. A negative result may occur with  improper specimen collection/handling, submission of specimen other than nasopharyngeal swab, presence of viral mutation(s) within the areas targeted by this assay, and inadequate number of viral copies(<138 copies/mL). A negative result must be combined with clinical observations, patient history, and epidemiological information. The expected result is Negative.  Fact Sheet for Patients:  BloggerCourse.com  Fact Sheet for Healthcare Providers:  SeriousBroker.it  This test is no t yet approved or cleared by the [place] FDA and  has been authorized for detection and/or diagnosis of SARS-CoV-2 by FDA under an Emergency Use Authorization (EUA). This EUA will remain  in effect (meaning this test can be used) for the duration of the COVID-19 declaration under Section 564(b)(1) of the Act, 21 U.S.C.section 360bbb-3(b)(1), unless the authorization is terminated  or revoked sooner.       Influenza A by PCR NEGATIVE NEGATIVE Final   Influenza B by PCR NEGATIVE NEGATIVE Final    Comment: (NOTE) The Xpert Xpress SARS-CoV-2/FLU/RSV plus assay is intended as an aid in the diagnosis of influenza from Nasopharyngeal swab specimens and should not be used as a sole basis for treatment. Nasal washings and aspirates are unacceptable for Xpert Xpress SARS-CoV-2/FLU/RSV testing.  Fact Sheet for Patients: BloggerCourse.com  Fact Sheet for Healthcare Providers: SeriousBroker.it  This test is not yet approved or cleared by the [place] FDA and has been authorized for detection and/or  diagnosis of SARS-CoV-2  by FDA under an Emergency Use Authorization (EUA). This EUA will remain in effect (meaning this test can be used) for the duration of the COVID-19 declaration under Section 564(b)(1) of the Act, 21 U.S.C. section 360bbb-3(b)(1), unless the authorization is terminated or revoked.  Performed at [facility], [address] [place], [place] [address]   Urine Culture     Status: Abnormal   Collection Time: 06/24/21  9:00 PM   Specimen: In/Out Cath Urine  Result Value Ref Range Status   Specimen Description   Final    IN/OUT CATH URINE Performed at [facility], [address], [place], [place] [address]    Special Requests   Final    NONE Performed at [facility], [address] [place], [place] [address]    Culture (A)  Final    >=100,000 COLONIES/mL MULTIPLE SPECIES PRESENT, SUGGEST RECOLLECTION   Report Status 06/26/2021 FINAL  Final  MRSA Next Gen by PCR, Nasal     Status: Abnormal   Collection Time: 06/24/21  9:47 PM   Specimen: Nasal Mucosa; Nasal Swab  Result Value Ref Range Status   MRSA by PCR Next Gen DETECTED (A) NOT DETECTED Final    Comment: RESULT CALLED TO, READ BACK BY AND VERIFIED WITH: RN S MORDUE AT 2351 06/24/21 CRUICKSHANK A (NOTE) The GeneXpert MRSA Assay (FDA approved for NASAL specimens only), is one component of a comprehensive MRSA colonization surveillance program. It is not intended to diagnose MRSA infection nor to guide or monitor treatment for MRSA infections. Test performance is not FDA approved in patients less than 18 years old. Performed at [facility], [address], [place], [place] [address]   Aerobic Culture w Gram Stain (superficial specimen)     Status: None   Collection Time: 06/25/21  1:05 PM   Specimen: Leg  Result Value Ref Range Status   Specimen Description   Final    LEG LEFT Performed at [facility], [address]  [place], [place] [address]    Special Requests   Final    NONE Performed at [facility], [address] [place], [place] [address]    Gram Stain   Final    RARE WBC PRESENT, PREDOMINANTLY PMN NO ORGANISMS SEEN Performed at [facility], [address], [place], [place] [address]    Culture   Final    FEW METHICILLIN RESISTANT STAPHYLOCOCCUS AUREUS RARE ACHROMOBACTER XYLOSOXIDANS    Report Status 06/28/2021 FINAL  Final   Organism ID, Bacteria METHICILLIN RESISTANT STAPHYLOCOCCUS AUREUS  Final   Organism ID, Bacteria ACHROMOBACTER XYLOSOXIDANS  Final      Susceptibility   Methicillin resistant staphylococcus aureus - MIC*    CIPROFLOXACIN >=8 RESISTANT Resistant     ERYTHROMYCIN >=8 RESISTANT Resistant     GENTAMICIN <=0.5 SENSITIVE Sensitive     OXACILLIN >=4 RESISTANT Resistant     TETRACYCLINE <=1 SENSITIVE Sensitive     VANCOMYCIN <=0.5 SENSITIVE Sensitive     TRIMETH/SULFA <=10 SENSITIVE Sensitive     CLINDAMYCIN <=0.25 SENSITIVE Sensitive     RIFAMPIN <=0.5 SENSITIVE Sensitive     Inducible Clindamycin NEGATIVE Sensitive     * FEW METHICILLIN RESISTANT STAPHYLOCOCCUS AUREUS   Achromobacter xylosoxidans - MIC*    CEFAZOLIN >=64 RESISTANT Resistant     GENTAMICIN >=16 RESISTANT Resistant     CIPROFLOXACIN >=4 RESISTANT Resistant     IMIPENEM 2  SENSITIVE Sensitive     TRIMETH/SULFA <=20 SENSITIVE Sensitive     * RARE ACHROMOBACTER XYLOSOXIDANS  Aerobic Culture w Gram Stain (superficial specimen)     Status: None   Collection Time: 06/25/21  1:05 PM   Specimen: Toe  Result Value Ref Range Status   Specimen Description   Final    TOE LEFT INTERDIGITAL Performed at [facility], [address] [place], [place] [address]    Special Requests   Final    NONE Performed at [facility], [address] [place], [place] [address]    Gram Stain NO WBC SEEN FEW GRAM POSITIVE COCCI   Final   Culture   Final     ABUNDANT METHICILLIN RESISTANT STAPHYLOCOCCUS AUREUS WITHIN MIXED ORGANISMS Performed at [facility], [address], [place], [place] [address]    Report Status 06/30/2021 FINAL  Final   Organism ID, Bacteria METHICILLIN RESISTANT STAPHYLOCOCCUS AUREUS  Final      Susceptibility   Methicillin resistant staphylococcus aureus - MIC*    CIPROFLOXACIN >=8 RESISTANT Resistant     ERYTHROMYCIN >=8 RESISTANT Resistant     GENTAMICIN <=0.5 SENSITIVE Sensitive     OXACILLIN >=4 RESISTANT Resistant     TETRACYCLINE <=1 SENSITIVE Sensitive     VANCOMYCIN 1 SENSITIVE Sensitive     TRIMETH/SULFA <=10 SENSITIVE Sensitive     CLINDAMYCIN <=0.25 SENSITIVE Sensitive     RIFAMPIN <=0.5 SENSITIVE Sensitive     Inducible Clindamycin NEGATIVE Sensitive     * ABUNDANT METHICILLIN RESISTANT STAPHYLOCOCCUS AUREUS  Resp Panel by RT-PCR (Flu A&B, Covid) Nasopharyngeal Swab     Status: None   Collection Time: 07/01/21  9:45 AM   Specimen: Nasopharyngeal Swab; Nasopharyngeal(NP) swabs in vial transport medium  Result Value Ref Range Status   SARS Coronavirus 2 by RT PCR NEGATIVE NEGATIVE Final    Comment: (NOTE) SARS-CoV-2 target nucleic acids are NOT DETECTED.  The SARS-CoV-2 RNA is generally detectable in upper respiratory specimens during the acute phase of infection. The lowest concentration of SARS-CoV-2 viral copies this assay can detect is 138 copies/mL. A negative result does not preclude SARS-Cov-2 infection and should not be used as the sole basis for treatment or other patient management decisions. A negative result may occur with  improper specimen collection/handling, submission of specimen other than nasopharyngeal swab, presence of viral mutation(s) within the areas targeted by this assay, and inadequate number of viral copies(<138 copies/mL). A negative result must be combined with clinical observations, patient history, and epidemiological information. The expected result is  Negative.  Fact Sheet for Patients:  BloggerCourse.com  Fact Sheet for Healthcare Providers:  SeriousBroker.it  This test is no t yet approved or cleared by the [place] FDA and  has been authorized for detection and/or diagnosis of SARS-CoV-2 by FDA under an Emergency Use Authorization (EUA). This EUA will remain  in effect (meaning this test can be used) for the duration of the COVID-19 declaration under Section 564(b)(1) of the Act, 21 U.S.C.section 360bbb-3(b)(1), unless the authorization is terminated  or revoked sooner.       Influenza A by PCR NEGATIVE NEGATIVE Final   Influenza B by PCR NEGATIVE NEGATIVE Final    Comment: (NOTE) The Xpert Xpress SARS-CoV-2/FLU/RSV plus assay is intended as an aid in the diagnosis of influenza from Nasopharyngeal swab specimens and should not be used as a sole basis for treatment. Nasal washings and aspirates are unacceptable for Xpert Xpress SARS-CoV-2/FLU/RSV testing.  Fact Sheet for Patients: BloggerCourse.comhttps://www.fda.gov/media/152166/download  Fact Sheet for Healthcare Providers: SeriousBroker.ithttps://www.fda.gov/media/152162/download  This test is not yet approved or cleared by the [place] FDA and has been authorized for detection and/or diagnosis of SARS-CoV-2 by FDA under an Emergency Use Authorization (EUA). This EUA will remain in effect (meaning this test can be used) for the duration of the COVID-19 declaration under Section 564(b)(1) of the Act, 21 U.S.C. section 360bbb-3(b)(1), unless the authorization is terminated or revoked.  Performed at [facility], [address] [place], [place] [id number]      Labs:  CBC: Recent Labs  Lab 06/26/21 0242 06/27/21 0457 06/28/21 0442 06/29/21 0528  WBC 10.3 9.2 10.6* 9.9  NEUTROABS 7.4  --   --   --   HGB 12.1 11.4* 11.7* 11.6*  HCT 38.1 35.3* 37.1 35.9*  MCV 102.1* 101.4* 101.6* 100.8*  PLT 297 295 288 297    BMP &GFR Recent Labs  Lab 06/26/21 0242 06/27/21 0457 06/28/21 0442 06/29/21 0528  NA 138 139 141 138  K 3.2* 4.0 3.7 3.6  CL 110 113* 115* 111  CO2 17* 20* 20* 23  GLUCOSE 82 102* 98 94  BUN 47* 29* 23 19  CREATININE 1.73* 1.22* 0.99 0.99  CALCIUM 8.8* 8.8* 8.8* 8.5*  MG 2.2 2.0 1.9 2.1  PHOS 3.6 1.4* 1.8* 2.4*   Estimated Creatinine Clearance: 49.4 mL/min (by C-G formula based on SCr of 0.99 mg/dL). Liver & Pancreas: Recent Labs  Lab 06/26/21 0242 06/27/21 0457 06/28/21 0442 06/29/21 0528  AST 60*  --  42* 34  ALT 33  --  37 35  ALKPHOS 75  --  62 61  BILITOT 0.8  --  0.6 0.8  PROT 5.9*  --  5.4* 5.4*  ALBUMIN 2.4* 2.2* 2.3* 2.3*   No results for input(s): LIPASE, AMYLASE in the last 168 hours. Recent Labs  Lab 06/26/21 1442  AMMONIA 17   Diabetic: No results for input(s): HGBA1C in the last 72 hours. Recent Labs  Lab 07/01/21 1653 07/01/21 2021 07/01/21 2326 07/02/21 0422 07/02/21 0745  GLUCAP 90 95 121* 97 90   Cardiac Enzymes: Recent Labs  Lab 06/26/21 0242 06/27/21 0457 06/28/21 0442 06/29/21 0528  CKTOTAL 643* 402* 247* 190   No results for input(s): PROBNP in the last 8760 hours. Coagulation Profile: Recent Labs  Lab 06/30/21 0416  INR 1.0   Thyroid Function Tests: No results for input(s): TSH, T4TOTAL, FREET4, T3FREE, THYROIDAB in the last 72 hours. Lipid Profile: No results for input(s): CHOL, HDL, LDLCALC, TRIG, CHOLHDL, LDLDIRECT in the last 72 hours. Anemia Panel: Recent Labs    06/30/21 0416  VITAMINB12 821  FOLATE 28.1  FERRITIN 175  TIBC 152*  IRON 52  RETICCTPCT 2.4   Urine analysis:    Component Value Date/Time   COLORURINE YELLOW 06/24/2021 2100   APPEARANCEUR HAZY (A) 06/24/2021 2100   LABSPEC 1.017 06/24/2021 2100   PHURINE 6.0 06/24/2021 2100   GLUCOSEU NEGATIVE 06/24/2021 2100   HGBUR MODERATE (A) 06/24/2021 2100   BILIRUBINUR NEGATIVE 06/24/2021 2100   KETONESUR NEGATIVE 06/24/2021 2100   PROTEINUR  100 (A) 06/24/2021 2100   NITRITE NEGATIVE 06/24/2021 2100   LEUKOCYTESUR MODERATE (A) 06/24/2021 2100   Sepsis Labs: Invalid input(s): PROCALCITONIN, LACTICIDVEN   Time coordinating discharge: 40 minutes  SIGNED:  Almon Herculesaye T Jacqulyn Barresi, MD  [facility] 07/02/2021, 9:28 AM  If 7PM-7AM, please contact night-coverage [email or web address]

## 2021-07-02 NOTE — TOC Transition Note (Addendum)
Transition of Care [facility]) - CM/SW Discharge Note   Patient Details  Name: Lulia Schriner MRN: [id number] Date of Birth: 1940/12/28  Transition of Care [facility]) CM/SW Contact:  Lanier Clam, RN Phone Number: 07/02/2021, 9:36 AM   Clinical Narrative: d/c today to [facility]-await d/c summary. [facility] for transport.  D/c summary sent. Going to rm#[id number],nsg call report tel#[phone].  10:14p-PTAR scheduled for 12p pick up. Jillyn Hidden spouse will bring in clothes prior transport. Nsg notified.    Final next level of care: Skilled Nursing Facility Barriers to Discharge: No Barriers Identified   Patient Goals and CMS Choice Patient states their goals for this hospitalization and ongoing recovery are:: go to rehab CMS Medicare.gov Compare Post Acute Care list provided to:: Patient Represenative (must comment) Jillyn Hidden spouse [phone]) Choice offered to / list presented to : Spouse  Discharge Placement PASRR number recieved: 06/30/21            Patient chooses bed at: Other - please specify in the comment section below: [facility]) Patient to be transferred to facility by: PTAR Name of family member notified: Jillyn Hidden spouse [phone] Patient and family notified of of transfer: 07/02/21  Discharge Plan and Services   Discharge Planning Services: CM Consult Post Acute Care Choice: Skilled Nursing Facility                               Social Determinants of Health (SDOH) Interventions     Readmission Risk Interventions No flowsheet data found.

## 2021-07-02 NOTE — Progress Notes (Signed)
Attempted to call report to [facility] x2.

## 2021-07-02 NOTE — Plan of Care (Signed)

## 2021-07-02 NOTE — Progress Notes (Signed)
Attempted to give report x1 to [facility].

## 2022-04-28 ENCOUNTER — Emergency Department (HOSPITAL_COMMUNITY): Payer: Medicare Other

## 2022-04-28 ENCOUNTER — Other Ambulatory Visit: Payer: Self-pay

## 2022-04-28 ENCOUNTER — Inpatient Hospital Stay (HOSPITAL_COMMUNITY)
Admission: EM | Admit: 2022-04-28 | Discharge: 2022-05-06 | DRG: 300 | Disposition: A | Discharge status: Hospice | Payer: Medicare Other | Attending: Internal Medicine | Admitting: Internal Medicine

## 2022-04-28 ENCOUNTER — Encounter (HOSPITAL_COMMUNITY): Payer: Self-pay

## 2022-04-28 DIAGNOSIS — N189 Chronic kidney disease, unspecified: Secondary | ICD-10-CM

## 2022-04-28 DIAGNOSIS — R54 Age-related physical debility: Secondary | ICD-10-CM | POA: Diagnosis present

## 2022-04-28 DIAGNOSIS — F329 Major depressive disorder, single episode, unspecified: Secondary | ICD-10-CM | POA: Diagnosis present

## 2022-04-28 DIAGNOSIS — I96 Gangrene, not elsewhere classified: Secondary | ICD-10-CM

## 2022-04-28 DIAGNOSIS — J449 Chronic obstructive pulmonary disease, unspecified: Secondary | ICD-10-CM | POA: Diagnosis present

## 2022-04-28 DIAGNOSIS — R202 Paresthesia of skin: Secondary | ICD-10-CM | POA: Diagnosis present

## 2022-04-28 DIAGNOSIS — G2581 Restless legs syndrome: Secondary | ICD-10-CM | POA: Diagnosis present

## 2022-04-28 DIAGNOSIS — Z20822 Contact with and (suspected) exposure to covid-19: Secondary | ICD-10-CM | POA: Diagnosis present

## 2022-04-28 DIAGNOSIS — F03918 Unspecified dementia, unspecified severity, with other behavioral disturbance: Secondary | ICD-10-CM | POA: Diagnosis present

## 2022-04-28 DIAGNOSIS — Z79899 Other long term (current) drug therapy: Secondary | ICD-10-CM

## 2022-04-28 DIAGNOSIS — J4489 Other specified chronic obstructive pulmonary disease: Secondary | ICD-10-CM | POA: Diagnosis present

## 2022-04-28 DIAGNOSIS — K219 Gastro-esophageal reflux disease without esophagitis: Secondary | ICD-10-CM | POA: Diagnosis present

## 2022-04-28 DIAGNOSIS — N183 Chronic kidney disease, stage 3 unspecified: Secondary | ICD-10-CM | POA: Diagnosis present

## 2022-04-28 DIAGNOSIS — I83022 Varicose veins of left lower extremity with ulcer of calf: Secondary | ICD-10-CM | POA: Diagnosis present

## 2022-04-28 DIAGNOSIS — L03116 Cellulitis of left lower limb: Secondary | ICD-10-CM | POA: Diagnosis present

## 2022-04-28 DIAGNOSIS — E876 Hypokalemia: Secondary | ICD-10-CM

## 2022-04-28 DIAGNOSIS — Z823 Family history of stroke: Secondary | ICD-10-CM

## 2022-04-28 DIAGNOSIS — G629 Polyneuropathy, unspecified: Secondary | ICD-10-CM

## 2022-04-28 DIAGNOSIS — L0390 Cellulitis, unspecified: Secondary | ICD-10-CM | POA: Diagnosis present

## 2022-04-28 DIAGNOSIS — M79662 Pain in left lower leg: Secondary | ICD-10-CM | POA: Diagnosis not present

## 2022-04-28 DIAGNOSIS — I70262 Atherosclerosis of native arteries of extremities with gangrene, left leg: Principal | ICD-10-CM | POA: Diagnosis present

## 2022-04-28 DIAGNOSIS — Z8249 Family history of ischemic heart disease and other diseases of the circulatory system: Secondary | ICD-10-CM

## 2022-04-28 DIAGNOSIS — L97222 Non-pressure chronic ulcer of left calf with fat layer exposed: Secondary | ICD-10-CM | POA: Diagnosis present

## 2022-04-28 DIAGNOSIS — N179 Acute kidney failure, unspecified: Secondary | ICD-10-CM | POA: Insufficient documentation

## 2022-04-28 DIAGNOSIS — L97823 Non-pressure chronic ulcer of other part of left lower leg with necrosis of muscle: Secondary | ICD-10-CM | POA: Diagnosis present

## 2022-04-28 DIAGNOSIS — I872 Venous insufficiency (chronic) (peripheral): Secondary | ICD-10-CM | POA: Diagnosis present

## 2022-04-28 DIAGNOSIS — I739 Peripheral vascular disease, unspecified: Secondary | ICD-10-CM

## 2022-04-28 DIAGNOSIS — N1831 Chronic kidney disease, stage 3a: Secondary | ICD-10-CM | POA: Diagnosis present

## 2022-04-28 DIAGNOSIS — Z8614 Personal history of Methicillin resistant Staphylococcus aureus infection: Secondary | ICD-10-CM

## 2022-04-28 DIAGNOSIS — I10 Essential (primary) hypertension: Secondary | ICD-10-CM | POA: Diagnosis present

## 2022-04-28 DIAGNOSIS — Z66 Do not resuscitate: Secondary | ICD-10-CM | POA: Diagnosis present

## 2022-04-28 DIAGNOSIS — Z515 Encounter for palliative care: Secondary | ICD-10-CM

## 2022-04-28 DIAGNOSIS — I509 Heart failure, unspecified: Secondary | ICD-10-CM | POA: Diagnosis present

## 2022-04-28 DIAGNOSIS — I130 Hypertensive heart and chronic kidney disease with heart failure and stage 1 through stage 4 chronic kidney disease, or unspecified chronic kidney disease: Secondary | ICD-10-CM | POA: Diagnosis present

## 2022-04-28 DIAGNOSIS — Z87891 Personal history of nicotine dependence: Secondary | ICD-10-CM

## 2022-04-28 DIAGNOSIS — H9190 Unspecified hearing loss, unspecified ear: Secondary | ICD-10-CM

## 2022-04-28 DIAGNOSIS — L97923 Non-pressure chronic ulcer of unspecified part of left lower leg with necrosis of muscle: Secondary | ICD-10-CM

## 2022-04-28 DIAGNOSIS — Z809 Family history of malignant neoplasm, unspecified: Secondary | ICD-10-CM

## 2022-04-28 DIAGNOSIS — F0393 Unspecified dementia, unspecified severity, with mood disturbance: Secondary | ICD-10-CM | POA: Diagnosis present

## 2022-04-28 LAB — BASIC METABOLIC PANEL
Anion gap: 17 — ABNORMAL HIGH (ref 5–15)
BUN: 77 mg/dL — ABNORMAL HIGH (ref 8–23)
CO2: 25 mmol/L (ref 22–32)
Calcium: 9.8 mg/dL (ref 8.9–10.3)
Chloride: 97 mmol/L — ABNORMAL LOW (ref 98–111)
Creatinine, Ser: 2.79 mg/dL — ABNORMAL HIGH (ref 0.44–1.00)
GFR, Estimated: 17 mL/min — ABNORMAL LOW (ref >60)
Glucose, Bld: 107 mg/dL — ABNORMAL HIGH (ref 70–99)
Potassium: 3 mmol/L — ABNORMAL LOW (ref 3.5–5.1)
Sodium: 139 mmol/L (ref 135–145)

## 2022-04-28 LAB — CBC
HCT: 43.1 % (ref 36.0–46.0)
Hemoglobin: 14.6 g/dL (ref 12.0–15.0)
MCH: 32.5 pg (ref 26.0–34.0)
MCHC: 33.9 g/dL (ref 30.0–36.0)
MCV: 96 fL (ref 80.0–100.0)
Platelets: 389 10*3/uL (ref 150–400)
RBC: 4.49 MIL/uL (ref 3.87–5.11)
RDW: 12.4 % (ref 11.5–15.5)
WBC: 9.6 10*3/uL (ref 4.0–10.5)
nRBC: 0 % (ref 0.0–0.2)

## 2022-04-28 LAB — LACTIC ACID, PLASMA
Lactic Acid, Venous: 1.5 mmol/L (ref 0.5–1.9)
Lactic Acid, Venous: 2.2 mmol/L (ref 0.5–1.9)

## 2022-04-28 LAB — SEDIMENTATION RATE: Sed Rate: 90 mm/hr — ABNORMAL HIGH (ref 0–22)

## 2022-04-28 MED ORDER — SODIUM CHLORIDE 0.9 % IV SOLN
2.0000 g | INTRAVENOUS | Status: DC
  Administered 2022-04-29 – 2022-05-01 (×3): 2 g via INTRAVENOUS
  Filled 2022-04-28 (×2): qty 20

## 2022-04-28 MED ORDER — ACETAMINOPHEN 650 MG RE SUPP
650.0000 mg | Freq: Four times a day (QID) | RECTAL | Status: DC | PRN
  Administered 2022-04-29: 650 mg via RECTAL
  Filled 2022-04-28: qty 1

## 2022-04-28 MED ORDER — POLYETHYLENE GLYCOL 3350 17 G PO PACK: 17.0000 g | PACK | Freq: Every day | ORAL | Status: DC | PRN

## 2022-04-28 MED ORDER — POTASSIUM CHLORIDE 20 MEQ PO PACK
60.0000 meq | PACK | Freq: Once | ORAL | Status: AC
  Administered 2022-04-29: 60 meq via ORAL
  Filled 2022-04-28: qty 3

## 2022-04-28 MED ORDER — ACETAMINOPHEN 325 MG PO TABS
650.0000 mg | ORAL_TABLET | Freq: Four times a day (QID) | ORAL | Status: DC | PRN
  Administered 2022-04-30 (×3): 650 mg via ORAL
  Filled 2022-04-28 (×3): qty 2

## 2022-04-28 MED ORDER — LACTATED RINGERS IV BOLUS
2000.0000 mL | Freq: Once | INTRAVENOUS | Status: AC
  Administered 2022-04-28: 2000 mL via INTRAVENOUS

## 2022-04-28 MED ORDER — SODIUM CHLORIDE 0.9 % IV SOLN: INTRAVENOUS | Status: DC

## 2022-04-28 MED ORDER — ENSURE ENLIVE PO LIQD
237.0000 mL | ORAL | Status: DC
Start: 2022-04-29 — End: 2022-05-02
  Administered 2022-04-29 – 2022-05-01 (×3): 237 mL via ORAL
  Filled 2022-04-28: qty 237

## 2022-04-28 MED ORDER — BUPROPION HCL ER (XL) 150 MG PO TB24
150.0000 mg | ORAL_TABLET | Freq: Every day | ORAL | Status: DC
  Administered 2022-04-29 – 2022-05-06 (×8): 150 mg via ORAL
  Filled 2022-04-28 (×8): qty 1

## 2022-04-28 MED ORDER — PANTOPRAZOLE SODIUM 40 MG PO TBEC
40.0000 mg | DELAYED_RELEASE_TABLET | Freq: Every day | ORAL | Status: DC
  Administered 2022-04-29 – 2022-05-06 (×9): 40 mg via ORAL
  Filled 2022-04-28 (×9): qty 1

## 2022-04-28 MED ORDER — HYDROCODONE-ACETAMINOPHEN 7.5-325 MG PO TABS
1.0000 | ORAL_TABLET | Freq: Four times a day (QID) | ORAL | Status: DC | PRN
  Administered 2022-04-29 – 2022-05-02 (×11): 1 via ORAL
  Filled 2022-04-28 (×11): qty 1

## 2022-04-28 MED ORDER — SODIUM CHLORIDE 0.9% FLUSH
3.0000 mL | Freq: Two times a day (BID) | INTRAVENOUS | Status: DC
  Administered 2022-04-29 – 2022-05-06 (×12): 3 mL via INTRAVENOUS

## 2022-04-28 MED ORDER — MOMETASONE FURO-FORMOTEROL FUM 200-5 MCG/ACT IN AERO: 2.0000 | INHALATION_SPRAY | Freq: Two times a day (BID) | RESPIRATORY_TRACT | Status: DC

## 2022-04-28 MED ORDER — VANCOMYCIN VARIABLE DOSE PER UNSTABLE RENAL FUNCTION (PHARMACIST DOSING): Status: DC

## 2022-04-28 MED ORDER — SODIUM CHLORIDE 0.9 % IV SOLN
2.0000 g | Freq: Once | INTRAVENOUS | Status: AC
  Administered 2022-04-28: 2 g via INTRAVENOUS
  Filled 2022-04-28: qty 20

## 2022-04-28 MED ORDER — VANCOMYCIN HCL 1500 MG/300ML IV SOLN
1500.0000 mg | Freq: Once | INTRAVENOUS | Status: AC
  Administered 2022-04-28: 1500 mg via INTRAVENOUS
  Filled 2022-04-28: qty 300

## 2022-04-28 MED ORDER — ENOXAPARIN SODIUM 30 MG/0.3ML IJ SOSY
30.0000 mg | PREFILLED_SYRINGE | INTRAMUSCULAR | Status: DC
  Administered 2022-04-29 – 2022-04-30 (×2): 30 mg via SUBCUTANEOUS
  Filled 2022-04-28 (×2): qty 0.3

## 2022-04-28 NOTE — Sepsis Progress Note (Signed)
Following per sepsis protocol   

## 2022-04-28 NOTE — ED Provider Triage Note (Signed)
Emergency Medicine Provider Triage Evaluation Note  [name] , a 81 y.o. female  was evaluated in triage.  Pt complains of left foot wound.  Patient has dementia, is at baseline mental status.  Patient unable to tell me when her foot began to appear infected.  Labs ordered in triage, IV antibiotics to include ceftriaxone and vancomycin were also ordered.  Review of Systems  Positive:  Negative:   Physical Exam  BP 117/66 (BP Location: Right Arm)   Pulse (!) 104   Temp 97.8 F (36.6 C) (Oral)   Resp 18   Ht 5\' 4"  (1.626 m)   Wt 84.4 kg   SpO2 97%   BMI 31.93 kg/m  Gen:   Awake, no distress   Resp:  Normal effort  MSK:   Moves extremities without difficulty  Other:  Erythematous, flaking skin, purulent drainage noted matted between toes.  Obvious cellulitis.  Medical Decision Making  Medically screening exam initiated at 7:07 PM.  Appropriate orders placed.  [name] was informed that the remainder of the evaluation will be completed by another provider, this initial triage assessment does not replace that evaluation, and the importance of remaining in the ED until their evaluation is complete.     [name], PA-C 04/28/22 1907

## 2022-04-28 NOTE — ED Provider Notes (Signed)
[facility]-EMERGENCY DEPT Provider Note   CSN: [id number] Arrival date & time: 04/28/22  1817     History  Chief Complaint  Patient presents with   foot  wound    [name] is a 81 y.o. female.  HPI     81 year old female comes in with chief complaint of foot wound.  Patient has history of dementia. Per care everywhere, patient has history of left lower extremity ulcers, left lower extremity cellulitis, CKD, CHF.  She appears to be being monitored by remote health.  She was sent here by remote health because of worsening leg swelling and worsening leg ulcers.  Patient is not very clear on history, but indicates that she was using some antifungal ointment.  Few days back, her symptoms started progressing in the last 2 or 3 days she is unable to walk because of pain.  Home Medications Prior to Admission medications   Medication Sig Start Date End Date Taking? Authorizing Provider  acetaminophen (TYLENOL) 325 MG tablet Take 2 tablets (650 mg total) by mouth every 6 (six) hours as needed for mild pain (or Fever >/= 101). 07/01/21   Gonfa, Boyce Mediciaye T, MD  bethanechol (URECHOLINE) 10 MG tablet Take 1 tablet (10 mg total) by mouth 3 (three) times daily. 07/01/21   Almon HerculesGonfa, Taye T, MD  buPROPion (WELLBUTRIN XL) 150 MG 24 hr tablet Take 150 mg by mouth daily.    [provider]  cefdinir (OMNICEF) 300 MG capsule Take 1 capsule (300 mg total) by mouth every 12 (twelve) hours. 07/01/21   Almon HerculesGonfa, Taye T, MD  diphenhydrAMINE (BENADRYL) 25 mg capsule Take 25 mg by mouth daily as needed for itching or allergies.    [provider]  feeding supplement (ENSURE ENLIVE / ENSURE PLUS) LIQD Take 237 mLs by mouth daily. 07/02/21   Almon HerculesGonfa, Taye T, MD  Fluticasone-Salmeterol (ADVAIR) 500-50 MCG/DOSE AEPB Inhale 1 puff into the lungs 2 (two) times daily. 06/15/17   [provider]  gabapentin (NEURONTIN) 100 MG capsule Take 2 capsules (200 mg total) by mouth 3  (three) times daily. 07/01/21   Almon HerculesGonfa, Taye T, MD  Glucosamine-Chondroitin (MOVE FREE PO) Take 1 tablet by mouth daily.    [provider]  Hyprom-Naphaz-Polysorb-Zn Sulf (CLEAR EYES COMPLETE OP) Place 1 drop into both eyes 2 (two) times daily as needed (redness).    [provider]  linezolid (ZYVOX) 600 MG tablet Take 1 tablet (600 mg total) by mouth every 12 (twelve) hours. 07/01/21   Almon HerculesGonfa, Taye T, MD  losartan (COZAAR) 50 MG tablet Take 25 mg by mouth daily.    [provider]  Multiple Vitamin (MULTI-VITAMINS) TABS Take 1 tablet by mouth daily.    [provider]  omeprazole (PRILOSEC) 20 MG capsule Take 20 mg by mouth daily as needed (heartburn).    [provider]  oxyCODONE 10 MG TABS Take 1 tablet (10 mg total) by mouth as needed (about an hour before dressing change). 07/01/21   Almon HerculesGonfa, Taye T, MD  polyethylene glycol (MIRALAX / GLYCOLAX) 17 g packet Take 17 g by mouth daily as needed for mild constipation. 07/01/21   Almon HerculesGonfa, Taye T, MD  senna-docusate (SENOKOT-S) 8.6-50 MG tablet Take 1 tablet by mouth 2 (two) times daily between meals as needed for mild constipation. 07/01/21   Almon HerculesGonfa, Taye T, MD  silver sulfADIAZINE (SILVADENE) 1 % cream Apply topically 2 (two) times daily. 07/01/21   Almon HerculesGonfa, Taye T, MD  SIMETHICONE PO Take  180 mg by mouth daily as needed (flatulance).    [provider]  theophylline (UNIPHYL) 400 MG 24 hr tablet Take 400 mg by mouth daily. 06/15/17   [provider]      Allergies    Meloxicam    Review of Systems   Review of Systems  Physical Exam Updated Vital Signs BP (!) 110/59   Pulse 91   Temp 97.8 F (36.6 C) (Oral)   Resp 20   Ht 5\' 4"  (1.626 m)   Wt 84.4 kg   SpO2 95%   BMI 31.93 kg/m  Physical Exam Vitals and nursing note reviewed.  Constitutional:      Appearance: She is well-developed.  HENT:     Head: Atraumatic.  Cardiovascular:     Rate and Rhythm: Normal rate.  Pulmonary:      Effort: Pulmonary effort is normal.  Musculoskeletal:        General: Swelling and tenderness present.     Cervical back: Normal range of motion and neck supple.  Skin:    General: Skin is warm and dry.     Findings: Erythema present.     Comments: Patient has erythematous left foot, left leg distal to the knee. There are multiple areas of ulcers, multiple areas of hyperpigmentation of skin.  There is also some drainage from between her toes.  Neurological:     Mental Status: She is alert. She is disoriented.           ED Results / Procedures / Treatments   Labs (all labs ordered are listed, but only abnormal results are displayed) Labs Reviewed  BASIC METABOLIC PANEL - Abnormal; Notable for the following components:      Result Value   Potassium 3.0 (*)    Chloride 97 (*)    Glucose, Bld 107 (*)    BUN 77 (*)    Creatinine, Ser 2.79 (*)    GFR, Estimated 17 (*)    Anion gap 17 (*)    All other components within normal limits  LACTIC ACID, PLASMA - Abnormal; Notable for the following components:   Lactic Acid, Venous 2.2 (*)    All other components within normal limits  RESP PANEL BY RT-PCR (FLU A&B, COVID) [id number]  CBC  LACTIC ACID, PLASMA  SEDIMENTATION RATE  C-REACTIVE PROTEIN    EKG None  Radiology DG Ankle Complete Left  Result Date: 04/28/2022 CLINICAL DATA:  Wound. EXAM: LEFT ANKLE COMPLETE - 3+ VIEW COMPARISON:  Tibia/fibular radiographs 06/24/2021 FINDINGS: No erosion, periosteal reaction, or bone destruction. Normal ankle alignment. Preserved ankle mortise. No fracture. Small plantar calcaneal spur and Achilles tendon enthesophyte. Generalized soft tissue edema. No radiopaque foreign body or soft tissue gas. IMPRESSION: 1. Generalized soft tissue edema. No radiographic findings of osteomyelitis. 2. Small plantar calcaneal spur and Achilles tendon enthesophyte. Electronically Signed   By: 08/24/2021 M.D.   On: 04/28/2022 19:39   DG Foot Complete  Left  Result Date: 04/28/2022 CLINICAL DATA:  rule out osteo.  Pre-existing left foot wound. EXAM: LEFT FOOT - COMPLETE 3+ VIEW COMPARISON:  None Available. FINDINGS: No cortical erosion or destruction. There is no evidence of fracture or dislocation. Degenerative changes of the distal interphalangeal joints. Diffuse mild subcutaneus soft tissue edema. IMPRESSION: 1. No radiographic findings suggest osteomyelitis. If high clinical suspicion, please consider MRI (with intravenous contrast if GFR greater than 30). 2.  No acute displaced fracture or dislocation. Electronically Signed   By: 06/28/2022.D.  On: 04/28/2022 19:39    Procedures .Critical Care Performed by: Derwood Kaplan, MD Authorized by: Derwood Kaplan, MD   Critical care provider statement:    Critical care time (minutes):  31   Critical care was necessary to treat or prevent imminent or life-threatening deterioration of the following conditions:  Sepsis   Critical care was time spent personally by me on the following activities:  Development of treatment plan with patient or surrogate, discussions with consultants, evaluation of patient's response to treatment, examination of patient, ordering and review of laboratory studies, ordering and review of radiographic studies, ordering and performing treatments and interventions, pulse oximetry, re-evaluation of patient's condition and review of old charts    Medications Ordered in ED Medications  cefTRIAXone (ROCEPHIN) 2 g in sodium chloride 0.9 % 100 mL IVPB (has no administration in time range)  vancomycin (VANCOREADY) IVPB 1500 mg/300 mL (1,500 mg Intravenous New Bag/Given 04/28/22 2010)  lactated ringers bolus 2,000 mL (has no administration in time range)    ED Course/ Medical Decision Making/ A&P                           Medical Decision Making Amount and/or Complexity of Data Reviewed Labs: ordered.  This patient presents to the ED with chief complaint(s) of leg  wound evaluation with pertinent past medical history of ulcers of the left leg, cellulitis of the left leg, CHF and dementia which further complicates the presenting complaint. The complaint involves an extensive differential diagnosis and also carries with it a high risk of complications and morbidity.    The differential diagnosis includes : Cellulitis, gangrene, chronic ulcers requiring debridement, myositis, osteomyelitis.  The initial plan is to order basic blood work and x-ray of the foot. This work-up was actually initiated in the triage by provider in triage.  They also had ordered vancomycin and ceftriaxone. I will add sed rate and CRP.  Additional history obtained: Records reviewed Care Everywhere/External Records  Independent labs interpretation:  The following labs were independently interpreted: White count that is normal at 9.6.  Creatinine of 2.79, BUN of 77 indicating acute on chronic renal failure with uremia, potassium is 3, lactic acid is 2.2.  Renal failure secondary to dehydration based on BUN/creatinine ratio However, patient does have elevated lactate.  Evolving sepsis at this time.  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

## 2022-04-28 NOTE — Progress Notes (Signed)
A consult was received from an ED physician for Vancomycin per pharmacy dosing.  The patient's profile has been reviewed for ht/wt/allergies/indication/available labs.   A one time order has been placed for Vancomycin 1500mg  IV.  Further antibiotics/pharmacy consults should be ordered by admitting physician if indicated.                       Thank you,  PharmD, BCPS [facility] main pharmacy [phone] 04/28/2022 7:10 PM

## 2022-04-28 NOTE — Progress Notes (Signed)
Pharmacy Antibiotic Note  [name] is a 81 y.o. female admitted on 04/28/2022 with cellulitis.  Pharmacy has been consulted for Vancomycin dosing.  Admit with AKI (Scr 2.79 with baseline ~1.0).  Received loading dose of Vancomycin in ED.   Plan: Due to AKI- will check random Vancomycin level prior to re-dosing.  Will plan for 6/8 with morning labs or sooner if renal function improves.  Monitor renal function and cx data   Height: 5\' 4"  (162.6 cm) Weight: 84.4 kg (186 lb) IBW/kg (Calculated) : 54.7  Temp (24hrs), Avg:97.8 F (36.6 C), Min:97.8 F (36.6 C), Max:97.8 F (36.6 C)  Recent Labs  Lab 04/28/22 2005 04/28/22 2052  WBC 9.6  --   CREATININE 2.79*  --   LATICACIDVEN 1.5 2.2*    Estimated Creatinine Clearance: 16.6 mL/min (A) (by C-G formula based on SCr of 2.79 mg/dL (H)).    Allergies  Allergen Reactions   Meloxicam Rash    Antimicrobials this admission: 6/5 Rocephin >>  6/5 Vancomycin >>   Dose adjustments this admission:  Microbiology results: MRSA PCR:   Thank you for allowing pharmacy to be a part of this patient's care.  2053 PharmD 04/28/2022 11:32 PM

## 2022-04-28 NOTE — ED Triage Notes (Signed)
Per EMS- Patient has dementia. Patient lives with her husband who is her caretaker. Patient's husband is bedridden and is unable to take care of her at this time.  Patient has preexisting left foot wounds.

## 2022-04-28 NOTE — H&P (Addendum)
History and Physical   [name] LNL:[id number] DOB: Aug 15, 1941 DOA: 04/28/2022  PCP: Nickola Major, MD   Patient coming from: Home  Chief Complaint: Foot wound  HPI: [name] is a 81 y.o. female with medical history significant of dementia, low back pain, depression, CKD 3, GERD, asthma, hypertension, RLS, hearing loss presenting with foot wound.  History obtained with assistance of chart review.  There is report of some baseline dementia, however I do not see any diagnosis of this and she is alert and appropriate on exam.  Patient has had issues with chronic foot wounds and cellulitis for some time.  She has chronic dementia and is being monitored by remote health.  EMS was called by remote health after was noted that her left lower extremity ulcerations were worsening.  She is noted to have ongoing pain for the past couple days now to the point where it is difficult to walk.  Also with increased swelling.  ED Course: Vital signs in the ED significant for heart rate in the 90s to 100s.  Lab work-up included BMP with potassium 3.0, chloride 97, creatinine elevated 2.79 from baseline of 1, glucose 107.  CBC within normal limits.  Lactic acid 1.5 then two-point 4 repeat.  ESR and CRP pending.  Respiratory panel flu COVID-negative.  Left foot x-ray without osteomyelitis left ankle x-ray showing soft tissue edema but no evidence of osteomyelitis.  Patient started on ceftriaxone and vancomycin in the ED.  2 L of IV fluids ordered.  Review of Systems: As per HPI otherwise all other systems reviewed and are negative.  Past Medical History:  Diagnosis Date   Arthritis    Asthma    Chronic renal impairment, stage 3 (moderate) (HCC)    Depression    GERD (gastroesophageal reflux disease)    Hearing loss    Hypertension    Neck pain    Numbness    Restless legs    Sciatica     Past Surgical History:  Procedure Laterality Date   arm surgery     due to dog bite   CATARACT  EXTRACTION, BILATERAL     OTHER SURGICAL HISTORY     bone graft - jaw    TOOTH EXTRACTION      Social History  reports that she quit smoking about 24 years ago. She has never used smokeless tobacco. She reports current alcohol use. She reports that she does not use drugs.  Allergies  Allergen Reactions   Meloxicam Rash    Family History  Problem Relation Age of Onset   Stroke Mother    Hypertension Father    Heart disease Father    Heart attack Father    Cancer Sister        unsure of type  Reviewed on admission  Prior to Admission medications   Medication Sig Start Date End Date Taking? Authorizing Provider  acetaminophen (TYLENOL) 325 MG tablet Take 2 tablets (650 mg total) by mouth every 6 (six) hours as needed for mild pain (or Fever >/= 101). 07/01/21   Gonfa, Charlesetta Ivory, MD  bethanechol (URECHOLINE) 10 MG tablet Take 1 tablet (10 mg total) by mouth 3 (three) times daily. 07/01/21   Mercy Riding, MD  buPROPion (WELLBUTRIN XL) 150 MG 24 hr tablet Take 150 mg by mouth daily.    [provider]  cefdinir (OMNICEF) 300 MG capsule Take 1 capsule (300 mg total) by mouth every 12 (twelve) hours. 07/01/21   Wendee Beavers  T, MD  diphenhydrAMINE (BENADRYL) 25 mg capsule Take 25 mg by mouth daily as needed for itching or allergies.    [provider]  feeding supplement (ENSURE ENLIVE / ENSURE PLUS) LIQD Take 237 mLs by mouth daily. 07/02/21   Mercy Riding, MD  Fluticasone-Salmeterol (ADVAIR) 500-50 MCG/DOSE AEPB Inhale 1 puff into the lungs 2 (two) times daily. 06/15/17   [provider]  gabapentin (NEURONTIN) 100 MG capsule Take 2 capsules (200 mg total) by mouth 3 (three) times daily. 07/01/21   Mercy Riding, MD  Glucosamine-Chondroitin (MOVE FREE PO) Take 1 tablet by mouth daily.    [provider]  Hyprom-Naphaz-Polysorb-Zn Sulf (CLEAR EYES COMPLETE OP) Place 1 drop into both eyes 2 (two) times daily as needed (redness).    [provider]  linezolid  (ZYVOX) 600 MG tablet Take 1 tablet (600 mg total) by mouth every 12 (twelve) hours. 07/01/21   Mercy Riding, MD  losartan (COZAAR) 50 MG tablet Take 25 mg by mouth daily.    [provider]  Multiple Vitamin (MULTI-VITAMINS) TABS Take 1 tablet by mouth daily.    [provider]  omeprazole (PRILOSEC) 20 MG capsule Take 20 mg by mouth daily as needed (heartburn).    [provider]  oxyCODONE 10 MG TABS Take 1 tablet (10 mg total) by mouth as needed (about an hour before dressing change). 07/01/21   Mercy Riding, MD  polyethylene glycol (MIRALAX / GLYCOLAX) 17 g packet Take 17 g by mouth daily as needed for mild constipation. 07/01/21   Mercy Riding, MD  senna-docusate (SENOKOT-S) 8.6-50 MG tablet Take 1 tablet by mouth 2 (two) times daily between meals as needed for mild constipation. 07/01/21   Mercy Riding, MD  silver sulfADIAZINE (SILVADENE) 1 % cream Apply topically 2 (two) times daily. 07/01/21   Mercy Riding, MD  SIMETHICONE PO Take 180 mg by mouth daily as needed (flatulance).    [provider]  theophylline (UNIPHYL) 400 MG 24 hr tablet Take 400 mg by mouth daily. 06/15/17   [provider]    Physical Exam: Vitals:   04/28/22 2038 04/28/22 2100 04/28/22 2117 04/28/22 2145  BP: 102/61 (!) 110/59  (!) 147/134  Pulse: 94 95 91 100  Resp: _0 Temp:      TempSrc:      SpO2: 98% 97% 95% 98%  Weight:      Height:        Physical Exam Constitutional:      General: She is not in acute distress.    Appearance: Normal appearance.  HENT:     Head: Normocephalic and atraumatic.     Mouth/Throat:     Mouth: Mucous membranes are moist.     Pharynx: Oropharynx is clear.  Eyes:     Extraocular Movements: Extraocular movements intact.     Pupils: Pupils are equal, round, and reactive to light.  Cardiovascular:     Rate and Rhythm: Normal rate and regular rhythm.     Pulses: Normal pulses.     Heart sounds: Normal heart sounds.   Pulmonary:     Effort: Pulmonary effort is normal. No respiratory distress.     Breath sounds: Normal breath sounds.  Abdominal:     General: Bowel sounds are normal. There is no distension.     Palpations: Abdomen is soft.     Tenderness: There is no abdominal tenderness.  Musculoskeletal:  General: Swelling present.     Comments: Left lower extremity wounds with surrounding erythema, edema, warmth.  Concerning for progression to recurrent cellulitis.  Skin:    General: Skin is warm and dry.  Neurological:     General: No focal deficit present.     Mental Status: Mental status is at baseline.   Labs on Admission: I have personally reviewed following labs and imaging studies  CBC: Recent Labs  Lab 04/28/22 2005  WBC 9.6  HGB 14.6  HCT 43.1  MCV 96.0  PLT 850    Basic Metabolic Panel: Recent Labs  Lab 04/28/22 2005  NA 139  K 3.0*  CL 97*  CO2 25  GLUCOSE 107*  BUN 77*  CREATININE 2.79*  CALCIUM 9.8    GFR: Estimated Creatinine Clearance: 16.6 mL/min (A) (by C-G formula based on SCr of 2.79 mg/dL (H)).  Liver Function Tests: No results for input(s): AST, ALT, ALKPHOS, BILITOT, PROT, ALBUMIN in the last 168 hours.  Urine analysis:    Component Value Date/Time   COLORURINE YELLOW 06/24/2021 2100   APPEARANCEUR HAZY (A) 06/24/2021 2100   LABSPEC 1.017 06/24/2021 2100   PHURINE 6.0 06/24/2021 2100   GLUCOSEU NEGATIVE 06/24/2021 2100   HGBUR MODERATE (A) 06/24/2021 2100   [place] NEGATIVE 06/24/2021 2100   [place] NEGATIVE 06/24/2021 2100   PROTEINUR 100 (A) 06/24/2021 2100   NITRITE NEGATIVE 06/24/2021 2100   LEUKOCYTESUR MODERATE (A) 06/24/2021 2100    Radiological Exams on Admission: DG Ankle Complete Left  Result Date: 04/28/2022 CLINICAL DATA:  Wound. EXAM: LEFT ANKLE COMPLETE - 3+ VIEW COMPARISON:  Tibia/fibular radiographs 06/24/2021 FINDINGS: No erosion, periosteal reaction, or bone destruction. Normal ankle alignment. Preserved ankle  mortise. No fracture. Small plantar calcaneal spur and Achilles tendon enthesophyte. Generalized soft tissue edema. No radiopaque foreign body or soft tissue gas. IMPRESSION: 1. Generalized soft tissue edema. No radiographic findings of osteomyelitis. 2. Small plantar calcaneal spur and Achilles tendon enthesophyte. Electronically Signed   By: Keith Rake M.D.   On: 04/28/2022 19:39   DG Foot Complete Left  Result Date: 04/28/2022 CLINICAL DATA:  rule out osteo.  Pre-existing left foot wound. EXAM: LEFT FOOT - COMPLETE 3+ VIEW COMPARISON:  None Available. FINDINGS: No cortical erosion or destruction. There is no evidence of fracture or dislocation. Degenerative changes of the distal interphalangeal joints. Diffuse mild subcutaneus soft tissue edema. IMPRESSION: 1. No radiographic findings suggest osteomyelitis. If high clinical suspicion, please consider MRI (with intravenous contrast if GFR greater than 30). 2.  No acute displaced fracture or dislocation. Electronically Signed   By: Iven Finn M.D.   On: 04/28/2022 19:39    EKG: Independently reviewed.  Unable to be reviewed due to technical difficulties with EMR.  Assessment/Plan Principal Problem:   Cellulitis Active Problems:   Chronic major depressive disorder   Chronic renal impairment, stage 3 (moderate) (HCC)   GERD (gastroesophageal reflux disease)   Chronic obstructive asthma (HCC)   Hypertension   Cellulitis Wounds > Patient with chronic wounds and history of prior cellulitis including MRSA infection. > Is demented and following with remote health who due to worsening status of her wounds and increasing pain with difficulty ambulation called EMS. > Started on antibiotics in the ED and given fluids. - Monitor on telemetry - Continue with ceftriaxone and vancomycin given history of MRSA - Continue with IV fluids - Follow-up ESR and CRP ordered in the ED - Continue to trend lactic acid considering initial uptrend - Trend  fever curve and WBC - PRN pain control - Will likely benefit from general surgery consult for possible debridement in the morning.  AKI on CKD Hypokalemia > Creatinine elevated to 2.79 from baseline around 1. > Possibly due to worsening dehydration  > Potassium noted to be 3.0 in the ED. - Received 2 L in the ED, continue with IV fluids - 60 mEq of p.o. potassium  - Check magnesium - Trend renal function electrolytes - Avoid nephrotoxic agents  Depression - Continue home bupropion  GERD - Continue home PPI  Hypertension - Holding home losartan in the setting of AKI  Dementia > There is report that she may have dementia, however have not seen a diagnosis of this in the chart.  Asthma - Replace home Advair with formulary Dulera  DVT prophylaxis: Lovenox Code Status:   DNR Family Communication:  Unable to reach husband by phone.  She states she has no other family to update. Disposition Plan:   Patient is from:  Home  Anticipated DC to:  Pending clinical course  Anticipated DC date:  1 to 5 days  Anticipated DC barriers: Possible need to discharge to somewhere other than home  Consults called:  None Admission status:  Observation, telemetry  Severity of Illness: The appropriate patient status for this patient is OBSERVATION. Observation status is judged to be reasonable and necessary in order to provide the required intensity of service to ensure the patient's safety. The patient's presenting symptoms, physical exam findings, and initial radiographic and laboratory data in the context of their medical condition is felt to place them at decreased risk for further clinical deterioration. Furthermore, it is anticipated that the patient will be medically stable for discharge from the hospital within 2 midnights of admission.    Marcelyn Bruins MD [facility]  How to contact the [facility] Attending or Consulting provider [place] or covering provider during after hours [place], for this patient?   Check the care team in [facility] and look for a) attending/consulting [facility] provider listed and b) the [facility] team listed Log into [email or web address] and use [place]'s universal password to access. If you do not have the password, please contact the hospital operator. Locate the [facility] provider you are looking for under [facility] and page to a number that you can be directly reached. If you still have difficulty reaching the provider, please page the [facility] (Director on Call) for the Hospitalists listed on amion for assistance.  04/28/2022, 10:33 PM

## 2022-04-28 NOTE — ED Notes (Signed)
Blood cultures not ordered, but I attempted to obtain a set of cultures with second IV start and was unable to draw any blood off of the line.

## 2022-04-29 ENCOUNTER — Encounter (HOSPITAL_COMMUNITY): Payer: Medicare Other

## 2022-04-29 DIAGNOSIS — N179 Acute kidney failure, unspecified: Secondary | ICD-10-CM | POA: Diagnosis not present

## 2022-04-29 LAB — MRSA NEXT GEN BY PCR, NASAL: MRSA by PCR Next Gen: DETECTED — AB

## 2022-04-29 LAB — COMPREHENSIVE METABOLIC PANEL
ALT: 13 U/L (ref 0–44)
AST: 20 U/L (ref 15–41)
Albumin: 2.9 g/dL — ABNORMAL LOW (ref 3.5–5.0)
Alkaline Phosphatase: 63 U/L (ref 38–126)
Anion gap: 12 (ref 5–15)
BUN: 63 mg/dL — ABNORMAL HIGH (ref 8–23)
CO2: 21 mmol/L — ABNORMAL LOW (ref 22–32)
Calcium: 8.4 mg/dL — ABNORMAL LOW (ref 8.9–10.3)
Chloride: 103 mmol/L (ref 98–111)
Creatinine, Ser: 1.97 mg/dL — ABNORMAL HIGH (ref 0.44–1.00)
GFR, Estimated: 25 mL/min — ABNORMAL LOW (ref >60)
Glucose, Bld: 93 mg/dL (ref 70–99)
Potassium: 3.1 mmol/L — ABNORMAL LOW (ref 3.5–5.1)
Sodium: 136 mmol/L (ref 135–145)
Total Bilirubin: 0.8 mg/dL (ref 0.3–1.2)
Total Protein: 6.4 g/dL — ABNORMAL LOW (ref 6.5–8.1)

## 2022-04-29 LAB — RESP PANEL BY RT-PCR (FLU A&B, COVID) ARPGX2
Influenza A by PCR: NEGATIVE
Influenza B by PCR: NEGATIVE
SARS Coronavirus 2 by RT PCR: NEGATIVE

## 2022-04-29 LAB — GLUCOSE, CAPILLARY: Glucose-Capillary: 114 mg/dL — ABNORMAL HIGH (ref 70–99)

## 2022-04-29 LAB — CBC
HCT: 36.5 % (ref 36.0–46.0)
Hemoglobin: 12.5 g/dL (ref 12.0–15.0)
MCH: 33.3 pg (ref 26.0–34.0)
MCHC: 34.2 g/dL (ref 30.0–36.0)
MCV: 97.3 fL (ref 80.0–100.0)
Platelets: 295 10*3/uL (ref 150–400)
RBC: 3.75 MIL/uL — ABNORMAL LOW (ref 3.87–5.11)
RDW: 12.6 % (ref 11.5–15.5)
WBC: 8.7 10*3/uL (ref 4.0–10.5)
nRBC: 0 % (ref 0.0–0.2)

## 2022-04-29 LAB — C-REACTIVE PROTEIN: CRP: 21.1 mg/dL — ABNORMAL HIGH (ref <1.0)

## 2022-04-29 LAB — MAGNESIUM: Magnesium: 2.2 mg/dL (ref 1.7–2.4)

## 2022-04-29 MED ORDER — POTASSIUM CHLORIDE CRYS ER 20 MEQ PO TBCR
40.0000 meq | EXTENDED_RELEASE_TABLET | Freq: Once | ORAL | Status: AC
  Administered 2022-04-29: 40 meq via ORAL
  Filled 2022-04-29: qty 2

## 2022-04-29 MED ORDER — SODIUM CHLORIDE 0.9 % IV SOLN: INTRAVENOUS | Status: DC

## 2022-04-29 MED ORDER — SILVER SULFADIAZINE 1 % EX CREA
TOPICAL_CREAM | Freq: Every day | CUTANEOUS | Status: DC
  Filled 2022-04-29: qty 50
  Filled 2022-04-29: qty 100
  Filled 2022-04-29: qty 85

## 2022-04-29 MED ORDER — SODIUM CHLORIDE 0.9 % IV BOLUS
500.0000 mL | Freq: Once | INTRAVENOUS | Status: AC
  Administered 2022-04-29: 500 mL via INTRAVENOUS

## 2022-04-29 MED ORDER — DAKINS (1/4 STRENGTH) 0.125 % EX SOLN
Freq: Every day | CUTANEOUS | Status: DC
  Administered 2022-05-04: 1 via TOPICAL
  Filled 2022-04-29 (×3): qty 473

## 2022-04-29 NOTE — Progress Notes (Signed)
   04/29/22 1847  Assess: MEWS Score  Temp (!) 101.8 F (38.8 C)  BP (!) 167/81  MAP (mmHg) 107  Pulse Rate (!) 121  Resp 20  SpO2 91 %  O2 Device Room Air  Assess: MEWS Score  MEWS Temp 2  MEWS Systolic 0  MEWS Pulse 2  MEWS RR 0  MEWS LOC 0  MEWS Score 4  MEWS Score Color Red  Assess: if the MEWS score is Yellow or Red  Were vital signs taken at a resting state? Yes  Focused Assessment Change from prior assessment (see assessment flowsheet)  Does the patient meet 2 or more of the SIRS criteria? No  MEWS guidelines implemented *See Row Information* Yes  Take Vital Signs  Increase Vital Sign Frequency  Red: Q 1hr X 4 then Q 4hr X 4, if remains red, continue Q 4hrs  Escalate  MEWS: Escalate Red: discuss with charge nurse/RN and provider, consider discussing with RRT  Notify: Charge Nurse/RN  Name of Charge Nurse/RN Notified Euka RN  Date Charge Nurse/RN Notified 04/29/22  Time Charge Nurse/RN Notified 1858  Notify: Provider  Provider Name/Title Dr. Sunnie Nielsen  Date Provider Notified 04/29/22  Time Provider Notified 1847  Method of Notification Page  Notification Reason Change in status  Provider response See new orders  Date of Provider Response 04/29/22  Time of Provider Response 1848  Notify: Rapid Response  Name of Rapid Response RN Notified Zoey RN  Date Rapid Response Notified 04/29/22  Time Rapid Response Notified 1840  Document  Patient Outcome Stabilized after interventions  Progress note created (see row info) Yes  Assess: SIRS CRITERIA  SIRS Temperature  1  SIRS Pulse 1  SIRS Respirations  0  SIRS WBC 0  SIRS Score Sum  2

## 2022-04-29 NOTE — Consult Note (Signed)
Orthopaedic Consult  Date/Time: 04/29/22 12:26 PM  Patient Name: [name]  Attending Physician: Alba Cory, MD    ASSESSMENT & PLAN  Orthopaedic Assessment: 81 y.o. female with chronic left lower extremity wounds and cellulitis and concern for vascular insufficiency.  Reductions/Procedures/Splinting/Anesthesia Performed: Reductions: None Splinting/casting: None Procedure(s): None Anesthesia: N/A  Plan: Patient seen and evaluated at bedside, and discussed with Dr. Aldean Baker.  At this time I do not feel that an urgent debridement of her left lower extremity wounds is indicated.  However, I do feel that she would benefit from a vascular work-up in order to guide subsequent treatments.  I feel the patient would be better served from the standpoint by transferring to [facility] for the vascular work-up to take place.  I would recommend consultation with vascular surgery there, but at minimum would likely require ABIs and potentially further vascular studies.  Otherwise I recommend wound care by the wound care team, and antibiotics per infectious disease/primary medical team.  Remainder of care per primary medical team.  These recommendations were discussed with the patient she understands and seems agreeable to proceeding accordingly.   Ernestina Columbia M.D. Orthopaedic Surgery [facility]   Medical Decision Making  Amount/complexity of data: Is there a pathologic fracture (e.g. neoplastic, osteoporotic insufficiency fracture)? No Independent interpretation of radiographic studies: Yes Review of radiology results (e.g. reports): Yes Tests ordered (e.g. additional radiographic studies, labs): No Lab results reviewed: Yes Reviewed old records: Yes History from another source (independent historian, e.g. family/friend/etc.): No Risk: Patient receiving IV controlled substances for pain: No Fracture requiring manipulation: No Urgent or  emergent (non-elective) surgery likely this admission: Yes Presence of medical comorbidities and/or surgical risk factors (e.g. current smoker, CAD, diabetes, COPD, CKD, etc.): Yes Closed fracture management WITHOUT manipulation: No Urgent minor procedure (e.g. joint aspiration, compartment pressure measurement, etc.): No Will likely need surgery as an outpatient: Yes     HPI [name] is a 81 y.o. female. Orthopaedic consultation has specifically been requested to address this patient's current musculoskeletal presentation.  She has apparently been dealing with left lower extremity wound issues for quite some time.  She has been seeing wound care on an outpatient basis for this, but it seems that she has had worsening pain in the foot over the past several days associated with increased redness and decreased ability to walk or bear weight related to the pain.  She denies any specific trauma.  Prior to this, she was ambulatory with a walker.  Pain is worse with movement and palpation, and better with avoiding these.  She is unsure if she has had any vascular work-up.  Review of her chart including her outside records does not demonstrate record of any thorough vascular work-up.   PMH Past Medical History:  Diagnosis Date   Arthritis    Asthma    Chronic renal impairment, stage 3 (moderate) (HCC)    Depression    GERD (gastroesophageal reflux disease)    Hearing loss    Hypertension    Neck pain    Numbness    Restless legs    Sciatica      PSH Past Surgical History:  Procedure Laterality Date   arm surgery     due to dog bite   CATARACT EXTRACTION, BILATERAL     OTHER SURGICAL HISTORY     bone graft - jaw    TOOTH EXTRACTION     Home Medications Prior to Admission medications  Medication Sig Start Date End Date Taking? Authorizing Provider  acetaminophen (TYLENOL) 325 MG tablet Take 2 tablets (650 mg total) by mouth every 6 (six) hours as needed for mild pain (or Fever  >/= 101). 07/01/21  Yes Gonfa, Taye T, MD  buPROPion (WELLBUTRIN XL) 150 MG 24 hr tablet Take 150 mg by mouth daily.   Yes [provider]  diphenhydrAMINE (BENADRYL) 25 mg capsule Take 25 mg by mouth daily as needed for itching or allergies.   Yes [provider]  feeding supplement (ENSURE ENLIVE / ENSURE PLUS) LIQD Take 237 mLs by mouth daily. 07/02/21  Yes Mercy Riding, MD  furosemide (LASIX) 20 MG tablet Take 20 mg by mouth daily. 04/24/22  Yes [provider]  gabapentin (NEURONTIN) 100 MG capsule Take 2 capsules (200 mg total) by mouth 3 (three) times daily. 07/01/21  Yes Mercy Riding, MD  Hyprom-Naphaz-Polysorb-Zn Sulf (CLEAR EYES COMPLETE OP) Place 1 drop into both eyes 2 (two) times daily as needed (redness).   Yes [provider]  losartan-hydrochlorothiazide (HYZAAR) 50-12.5 MG tablet Take 1 tablet by mouth daily. 03/28/22  Yes [provider]  Multiple Vitamin (MULTI-VITAMINS) TABS Take 1 tablet by mouth daily.   Yes [provider]  nystatin (MYCOSTATIN/NYSTOP) powder Apply 1 application. topically 2 (two) times daily. 03/28/22  Yes [provider]  omeprazole (PRILOSEC) 20 MG capsule Take 20 mg by mouth daily as needed (heartburn).   Yes [provider]  silver sulfADIAZINE (SILVADENE) 1 % cream Apply topically 2 (two) times daily. Patient taking differently: Apply 1 application. topically 2 (two) times daily. 07/01/21  Yes Mercy Riding, MD  theophylline (UNIPHYL) 400 MG 24 hr tablet Take 400 mg by mouth daily. 06/15/17  Yes [provider]  bethanechol (URECHOLINE) 10 MG tablet Take 1 tablet (10 mg total) by mouth 3 (three) times daily. Patient not taking: Reported on 04/28/2022 07/01/21   Mercy Riding, MD  cefdinir (OMNICEF) 300 MG capsule Take 1 capsule (300 mg total) by mouth every 12 (twelve) hours. Patient not taking: Reported on 04/28/2022 07/01/21   Mercy Riding, MD  linezolid (ZYVOX) 600 MG tablet Take 1 tablet  (600 mg total) by mouth every 12 (twelve) hours. Patient not taking: Reported on 04/28/2022 07/01/21   Mercy Riding, MD  oxyCODONE 10 MG TABS Take 1 tablet (10 mg total) by mouth as needed (about an hour before dressing change). Patient not taking: Reported on 04/28/2022 07/01/21   Mercy Riding, MD  polyethylene glycol (MIRALAX / GLYCOLAX) 17 g packet Take 17 g by mouth daily as needed for mild constipation. Patient not taking: Reported on 04/28/2022 07/01/21   Mercy Riding, MD  senna-docusate (SENOKOT-S) 8.6-50 MG tablet Take 1 tablet by mouth 2 (two) times daily between meals as needed for mild constipation. Patient not taking: Reported on 04/28/2022 07/01/21   Mercy Riding, MD     Allergies Allergies  Allergen Reactions   Meloxicam Rash     Family History Family History  Problem Relation Age of Onset   Stroke Mother    Hypertension Father    Heart disease Father    Heart attack Father    Cancer Sister        unsure of type    Social History Social History   Socioeconomic History   Marital status: Married    Spouse name: Not on file   Number of children: 2   Years of education: associates  degree   Highest education level: Not on file  Occupational History   Occupation: Retired  Tobacco Use   Smoking status: Former    Types: Cigarettes    Quit date: 1999    Years since quitting: 24.4   Smokeless tobacco: Never  Vaping Use   Vaping Use: Never used  Substance and Sexual Activity   Alcohol use: Yes    Comment: 2 drinks per day   Drug use: No   Sexual activity: Not on file  Other Topics Concern   Not on file  Social History Narrative   Lives at home with husband.   Right-handed.   No caffeine use.   Social Determinants of Health   Financial Resource Strain: Not on file  Food Insecurity: Not on file  Transportation Needs: Not on file  Physical Activity: Not on file  Stress: Not on file  Social Connections: Not on file  Intimate Partner Violence: Not on file     Review  of Systems MSK: As noted per HPI above GI: No current Nausea/vomiting ENT: Denies sore throat, epistaxis CV: Denies chest pain  Resp: No current shortness of breath  Other than mentioned above, there are no Constitutional, Neurological, Psychiatric, ENT, Ophthalmological, Cardiovascular, Respiratory, GI, GU, Musculoskeletal, Integumentary, Lymphatic, Endocrine or Allergic issues.     Imaging  Independent interpretation of orthopaedic-relevant films: AP, lateral, and mortise views of the left ankle were independently interpreted, and the report was reviewed.  There is soft tissue edema, but no obvious findings concerning for osteomyelitis.  Likewise no fractures or acute osseous abnormalities are appreciated. AP, lateral, and oblique views of the left foot were independently interpreted, and the report was reviewed.  There is generalized osteopenia, and degenerative changes are present, but no obvious findings concerning for osteomyelitis or appreciated.  Likewise no fractures or acute osseous abnormalities are appreciated.  Radiographic results: DG Ankle Complete Left  Result Date: 04/28/2022 CLINICAL DATA:  Wound. EXAM: LEFT ANKLE COMPLETE - 3+ VIEW COMPARISON:  Tibia/fibular radiographs 06/24/2021 FINDINGS: No erosion, periosteal reaction, or bone destruction. Normal ankle alignment. Preserved ankle mortise. No fracture. Small plantar calcaneal spur and Achilles tendon enthesophyte. Generalized soft tissue edema. No radiopaque foreign body or soft tissue gas. IMPRESSION: 1. Generalized soft tissue edema. No radiographic findings of osteomyelitis. 2. Small plantar calcaneal spur and Achilles tendon enthesophyte. Electronically Signed   By: Keith Rake M.D.   On: 04/28/2022 19:39   DG Foot Complete Left  Result Date: 04/28/2022 CLINICAL DATA:  rule out osteo.  Pre-existing left foot wound. EXAM: LEFT FOOT - COMPLETE 3+ VIEW COMPARISON:  None Available. FINDINGS: No cortical erosion or  destruction. There is no evidence of fracture or dislocation. Degenerative changes of the distal interphalangeal joints. Diffuse mild subcutaneus soft tissue edema. IMPRESSION: 1. No radiographic findings suggest osteomyelitis. If high clinical suspicion, please consider MRI (with intravenous contrast if GFR greater than 30). 2.  No acute displaced fracture or dislocation. Electronically Signed   By: Iven Finn M.D.   On: 04/28/2022 19:39   Labs  Recent Labs    04/28/22 2005 04/29/22 0500  WBC 9.6 8.7  HGB 14.6 12.5  HCT 43.1 36.5  PLT 389 295   Recent Labs    04/28/22 2005 04/29/22 0500  NA 139 136  K 3.0* 3.1*  CL 97* 103  CO2 25 21*  BUN 77* 63*  CREATININE 2.79* 1.97*  GLUCOSE 107* 93  CALCIUM 9.8 8.4*   Lab Results  Component Value Date  INR 1.0 06/30/2021   INR 1.0 06/24/2021        Physical Examination  Patient is a 81 y.o. year old female who is alert, well appearing, and in no distress, mood is calm.  Orientation: oriented to person, place, time, and general circumstances  Vital Signs: BP (!) 91/55   Pulse 88   Temp 98 F (36.7 C) (Oral)   Resp 13   Ht 5\' 4"  (1.626 m)   Wt 84.4 kg   SpO2 98%   BMI 31.93 kg/m    Gait: Unable to ambulate.  Supine on stretcher.  Heart: Normal rate Lungs: Non-labored breathing Abdomen: Soft, Non-tender   Right Upper Extremity: Inspection: Atraumatic Palpation: Nontender ROM: Full, painless Joint Stability: No instability Strength: Normal Skin: Intact Peripheral Vascular: Well perfused Reflexes: No pathologic Sensation: Intact to light touch distally, though slightly diminished in pinky, which is baseline Lymph Nodes: None Palpable Coordination: Intact, normal   Left Upper Extremity: Inspection: Atraumatic Palpation: Nontender ROM: Full, painless Joint Stability: No instability Strength: Normal Skin: Intact Peripheral Vascular: Well perfused Reflexes: No pathologic Sensation: Intact to light touch  distally Lymph Nodes: None Palpable Coordination: Intact, normal    Right Lower Extremity: Inspection: Atraumatic Palpation: Nontender, with some pitting edema standing just proximal to the ankle ROM: Full, painless Joint Stability: No instability Strength: Normal Skin: Intact Peripheral Vascular: Well perfused Reflexes: No pathologic Sensation: Intact to light touch distally Lymph Nodes: None Palpable Coordination: Intact, normal   Left Lower Extremity: Inspection: Overall atraumatic appearance Palpation: Diffuse tenderness and edema to the mid leg ROM: Ankle motion is severely limited due to pain Joint Stability: No instability Strength: Intact dorsiflexion, plantarflexion, and EHL against manual resistance, slightly limited by pain Skin: 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  Erythema extending to the mid leg.  Multiple superficial appearing shallow necrotic ulcerations on the foot and leg.  Darkened skin surrounding the medial malleolus. Peripheral Vascular: Edema as noted.  Unable to palpate DP or PT pulses, though examination is somewhat limited by pain from cellulitis. Reflexes: No pathologic Sensation: Intact to light touch in the superficial peroneal, deep peroneal, and tibial distributions Lymph Nodes: None Palpable Coordination: Limited by pain    Pelvis: Skin: Intact Palpation: Nontender Stability: No instability      The review of the patient's medications does not in any way constitute an endorsement, by this clinician,  of their use, dosage, indications, route, efficacy, interactions, or other clinical parameters.  This note was generated within the EP[employer] EMR using Dragon medical speech recognition software and may contain inherent errors or omissions not intended by the user. Grammatical and punctuation errors, random word insertions, deletions, pronoun errors and incomplete sentences are occasional consequences of this technology due to software  limitations. Not all errors are caught or corrected.  Although every attempt is made to root out erroneus and incomplete transcription, the note may still not fully represent the intent or opinion of the author. If there are questions or concerns about the content of this note or information contained within the body of this dictation they should be addressed directly with the author for clarification.

## 2022-04-29 NOTE — Progress Notes (Signed)
   04/29/22 1957  Vitals  Temp 100.2 F (37.9 C)  Temp Source Oral  BP 138/79  MAP (mmHg) 88  BP Location Right Arm  BP Method Automatic  Patient Position (if appropriate) Lying  Pulse Rate (!) 112  Pulse Rate Source Monitor  MEWS COLOR  MEWS Score Color Yellow  Oxygen Therapy  SpO2 93 %  O2 Device Room Air  MEWS Score  MEWS Temp 0  MEWS Systolic 0  MEWS Pulse 2  MEWS RR 0  MEWS LOC 0  MEWS Score 2   Temperature has improved. Patient is constantly moving in bed, in which may cause elevated pulse. Pain medication give for LLE.

## 2022-04-29 NOTE — Progress Notes (Signed)
MD paged in regards to patients mentation change. Patient disoriented x4 and speech garbled. Rapid response called. Temperature found to be 101.8. Tylenol suppository given and MD updated. Per MD hold off on [facility] transfer until temperature is down. Ulice Dash RN at [facility] notified of changes in patient.

## 2022-04-29 NOTE — ED Notes (Signed)
Pt resting comfortably and in no apparent acute distress at this time. She had no complaints and no immediate needs identified at this time. Water provided per request and pure wick working appropriately. No requests at this time. Bed locked and in lowest position, call bell within reach.

## 2022-04-29 NOTE — Progress Notes (Signed)
Pharmacy Antibiotic Note  [name] is a 81 y.o. female admitted on 04/28/2022 with cellulitis.  Pharmacy has been consulted for Vancomycin dosing.  Admit with AKI (Scr 2.79 with baseline ~1.0), now improving. Received loading dose of Vancomycin in ED.   Plan: Renal function improving. Based on current renal function, would expect q48hr dosing regimen. Will plan to check random vancomycin level 6/7 with morning labs to assess for clearance and dose as indicated.  Will continue to follow renal function, cultures and clinical progress.  Height: 5\' 4"  (162.6 cm) Weight: 84.4 kg (186 lb) IBW/kg (Calculated) : 54.7  Temp (24hrs), Avg:97.8 F (36.6 C), Min:97.8 F (36.6 C), Max:97.8 F (36.6 C)  Recent Labs  Lab 04/28/22 2005 04/28/22 2052 04/29/22 0500  WBC 9.6  --  8.7  CREATININE 2.79*  --  1.97*  LATICACIDVEN 1.5 2.2*  --      Estimated Creatinine Clearance: 23.5 mL/min (A) (by C-G formula based on SCr of 1.97 mg/dL (H)).    Allergies  Allergen Reactions   Meloxicam Rash    Antimicrobials this admission: 6/5 Rocephin >>  6/5 Vancomycin >>   Dose adjustments this admission: N/A  Microbiology results: MRSA PCR: positive  Thank you for allowing pharmacy to be a part of this patient's care.  06/29/22, PharmD, BCPS Clinical Pharmacist 04/29/2022 10:18 AM

## 2022-04-29 NOTE — Progress Notes (Signed)
Report called to [place] on 40M at [facility].

## 2022-04-29 NOTE — Consult Note (Signed)
WOC Nurse Consult Note: Patient receiving care in [facility] ED 15. Reason for Consult: LLE cellulitis Wound type: unclear etiology at this time. Patient had an ABI ordered, but it was cancelled.  Pressure Injury POA: Yes/No/NA Measurement: See photos.  The wounds involve the entire LLE and foot Wound bed: see photos Drainage (amount, consistency, odor) yellow drainage at the base of the toes Periwound: discolored Dressing procedure/placement/frequency: Wash the LLE and foot with Dakin's solution by pouting it onto a washcloth, and gently rub the entire LLE and foot.  Apply Silvadene to LLE and foot AFTER washing the areas with the Dakin's solution. Cover with kerlix. Saturate the kerlix with saline prior to removing to ease the removal.  Elevate the heels off of the mattress.  Monitor the wound area(s) for worsening of condition such as: Signs/symptoms of infection,  Increase in size,  Development of or worsening of odor, Development of pain, or increased pain at the affected locations.  Notify the medical team if any of these develop.  Thank you for the consult.  WOC nurse will not follow at this time.  Please re-consult the WOC team if needed.  Helmut Muster, RN, MSN, CWOCN, CNS-BC, pager [phone]

## 2022-04-29 NOTE — Progress Notes (Signed)
2nd attempt to call patients husband. No option to leave a voicemail and patient does not know her husbands cell phone number. Will update receiving RN.

## 2022-04-29 NOTE — Progress Notes (Signed)
Attempted to call Zetta Stoneman (patients spouse) to update him and no response. Will attempt again prior to transfer.

## 2022-04-29 NOTE — Sepsis Progress Note (Signed)
2253: Noted that blood cultures were not ordered; sent secure chat to Dr. Kathrynn Humble and [facility] RN.  Pt had received antibiotics without delay, and bolus fluids. BS RN entered note regarding BC as requested.  0231:  Doctor did not respond, but did see the secure chat.  Followed up with the BS RN, who is going to contact the inpatient MD to see if anything was reported to him, to obtain order if still needed; antibiotics were not delayed.

## 2022-04-29 NOTE — Progress Notes (Signed)
Patient refused dressing change half way through trying to change it. Wrapped with kerlix and secured with tape the bottom half of LLE.

## 2022-04-29 NOTE — Progress Notes (Signed)
PROGRESS NOTE    [name]  DUK:[id number] DOB: [birth year] DOA: 04/28/2022 PCP: Nickola Major, MD   Brief Narrative: 81 year old with PMH significant for dementia, low back pain, depression, CKD stage III, GERD, asthma, hypertension, hearing loss, presents with left lower extremity cellulitis with ulceration and necrotic area.  She has history of chronic lower extremity wounds.  Patient was sent for further evaluation of left lower extremity wound by remote health due to worsening redness and infection.  Patient presented with AKI with a creatinine of 2.7 baseline 1, lactic acid 1.5, COVID PCR negative.  Left foot x-ray without osteomyelitis.    Assessment & Plan:   Principal Problem:   Cellulitis Active Problems:   Chronic major depressive disorder   Chronic renal impairment, stage 3 (moderate) (HCC)   GERD (gastroesophageal reflux disease)   Chronic obstructive asthma (HCC)   Hypertension   Acute renal failure superimposed on chronic kidney disease (HCC)  1-Cellulitis left lower extremity chronic wound, skin necrosis: -Wound care consulted -Orthopedic consulted who recommended transfer patient to [facility] and vascular evaluation -Dr. Donzetta Matters, consulted recommended transfer patient to [facility] with IV Ceftriaxone  and vancomycin. -unable to do ABI, because skin is not intact.  -CRP 21, ESR 90  AKI on CKD stage IIIa;  -Presented with a creatinine of 2.7.  Prior creatinine 0.9--0.9--1.2 -Continue with IV fluids.  -Strict I and O. -if not significant improvement could consider renal US>   Hypokalemia; replete orally.   Depression; Continune with Bupropion.   GERD: PPI  HTN; hold losartan due to AKI Asthma; continue with Dulera Dementia: There is a report that she may have dementia, but no previous diagnosis in chart.    Estimated body mass index is 31.93 kg/m as calculated from the following:   Height as of this encounter: $RemoveBeforeD'5\' 4"'aigHadETtqEZBD$  (1.626 m).    Weight as of this encounter: 84.4 kg.   DVT prophylaxis: Lovenox Code Status: DNR Family Communication: No family at bedside.  Disposition Plan:  Status is: Observation The patient will require care spanning > 2 midnights and should be moved to inpatient because: transfer to [facility] for vascular evaluation.     Consultants:  Vascular  Procedures:  None  Antimicrobials:    Subjective: She is alert, report LE pain   Objective: Vitals:   04/28/22 2145 04/29/22 0100 04/29/22 0400 04/29/22 0701  BP: (!) 147/134 137/73 123/68 136/82  Pulse: 100 89 95 94  Resp: $Remo'15 20 12 13  'FXVQk$ Temp:      TempSrc:      SpO2: 98% 92% 91% 95%  Weight:      Height:        Intake/Output Summary (Last 24 hours) at 04/29/2022 0732 Last data filed at 04/29/2022 0510 Gross per 24 hour  Intake 2000 ml  Output --  Net 2000 ml   Filed Weights   04/28/22 1901  Weight: 84.4 kg    Examination:  General exam: Appears calm and comfortable  Respiratory system: Clear to auscultation. Respiratory effort normal. Cardiovascular system: S1 & S2 heard, RRR. No JVD, murmurs, rubs, gallops or clicks. No pedal edema. Gastrointestinal system: Abdomen is nondistended, soft and nontender. No organomegaly or masses felt. Normal bowel sounds heard. Central nervous system: Alert and oriented.  Extremities: Symmetric 5 x 5 power. Skin: Left LE with redness, necrotic area, see picture on media.    Data Reviewed: I have personally reviewed following labs and imaging studies  CBC: Recent Labs  Lab 04/28/22 2005 04/29/22 0500  WBC 9.6 8.7  HGB 14.6 12.5  HCT 43.1 36.5  MCV 96.0 97.3  PLT 389 595   Basic Metabolic Panel: Recent Labs  Lab 04/28/22 2005 04/29/22 0500  NA 139 136  K 3.0* 3.1*  CL 97* 103  CO2 25 21*  GLUCOSE 107* 93  BUN 77* 63*  CREATININE 2.79* 1.97*  CALCIUM 9.8 8.4*   GFR: Estimated Creatinine Clearance: 23.5 mL/min (A) (by C-G formula based on SCr of 1.97 mg/dL (H)). Liver  Function Tests: Recent Labs  Lab 04/29/22 0500  AST 20  ALT 13  ALKPHOS 63  BILITOT 0.8  PROT 6.4*  ALBUMIN 2.9*   No results for input(s): LIPASE, AMYLASE in the last 168 hours. No results for input(s): AMMONIA in the last 168 hours. Coagulation Profile: No results for input(s): INR, PROTIME in the last 168 hours. Cardiac Enzymes: No results for input(s): CKTOTAL, CKMB, CKMBINDEX, TROPONINI in the last 168 hours. BNP (last 3 results) No results for input(s): PROBNP in the last 8760 hours. HbA1C: No results for input(s): HGBA1C in the last 72 hours. CBG: No results for input(s): GLUCAP in the last 168 hours. Lipid Profile: No results for input(s): CHOL, HDL, LDLCALC, TRIG, CHOLHDL, LDLDIRECT in the last 72 hours. Thyroid Function Tests: No results for input(s): TSH, T4TOTAL, FREET4, T3FREE, THYROIDAB in the last 72 hours. Anemia Panel: No results for input(s): VITAMINB12, FOLATE, FERRITIN, TIBC, IRON, RETICCTPCT in the last 72 hours. Sepsis Labs: Recent Labs  Lab 04/28/22 2005 04/28/22 2052  LATICACIDVEN 1.5 2.2*    Recent Results (from the past 240 hour(s))  Resp Panel by RT-PCR (Flu A&B, Covid) Anterior Nasal Swab     Status: None   Collection Time: 04/29/22 12:19 AM   Specimen: Anterior Nasal Swab  Result Value Ref Range Status   SARS Coronavirus 2 by RT PCR NEGATIVE NEGATIVE Final    Comment: (NOTE) SARS-CoV-2 target nucleic acids are NOT DETECTED.  The SARS-CoV-2 RNA is generally detectable in upper respiratory specimens during the acute phase of infection. The lowest concentration of SARS-CoV-2 viral copies this assay can detect is 138 copies/mL. A negative result does not preclude SARS-Cov-2 infection and should not be used as the sole basis for treatment or other patient management decisions. A negative result may occur with  improper specimen collection/handling, submission of specimen other than nasopharyngeal swab, presence of viral mutation(s) within  the areas targeted by this assay, and inadequate number of viral copies(<138 copies/mL). A negative result must be combined with clinical observations, patient history, and epidemiological information. The expected result is Negative.  Fact Sheet for Patients:  EntrepreneurPulse.com.au  Fact Sheet for Healthcare Providers:  IncredibleEmployment.be  This test is no t yet approved or cleared by the [place] FDA and  has been authorized for detection and/or diagnosis of SARS-CoV-2 by FDA under an Emergency Use Authorization (EUA). This EUA will remain  in effect (meaning this test can be used) for the duration of the COVID-19 declaration under Section 564(b)(1) of the Act, 21 U.S.C.section 360bbb-3(b)(1), unless the authorization is terminated  or revoked sooner.       Influenza A by PCR NEGATIVE NEGATIVE Final   Influenza B by PCR NEGATIVE NEGATIVE Final    Comment: (NOTE) The Xpert Xpress SARS-CoV-2/FLU/RSV plus assay is intended as an aid in the diagnosis of influenza from Nasopharyngeal swab specimens and should not be used as a sole basis for treatment. Nasal washings and aspirates are unacceptable for  Xpert Xpress SARS-CoV-2/FLU/RSV testing.  Fact Sheet for Patients: EntrepreneurPulse.com.au  Fact Sheet for Healthcare Providers: IncredibleEmployment.be  This test is not yet approved or cleared by the [place] FDA and has been authorized for detection and/or diagnosis of SARS-CoV-2 by FDA under an Emergency Use Authorization (EUA). This EUA will remain in effect (meaning this test can be used) for the duration of the COVID-19 declaration under Section 564(b)(1) of the Act, 21 U.S.C. section 360bbb-3(b)(1), unless the authorization is terminated or revoked.  Performed at [facility], [place] [address], [place], [place] [address]   MRSA Next Gen by PCR, Nasal     Status:  Abnormal   Collection Time: 04/29/22 12:19 AM   Specimen: Anterior Nasal Swab  Result Value Ref Range Status   MRSA by PCR Next Gen DETECTED (A) NOT DETECTED Final    Comment: (NOTE) The GeneXpert MRSA Assay (FDA approved for NASAL specimens only), is one component of a comprehensive MRSA colonization surveillance program. It is not intended to diagnose MRSA infection nor to guide or monitor treatment for MRSA infections. Test performance is not FDA approved in patients less than 16 years old. Performed at [facility], [place] [address] [place], [place] [address]          Radiology Studies: DG Ankle Complete Left  Result Date: 04/28/2022 CLINICAL DATA:  Wound. EXAM: LEFT ANKLE COMPLETE - 3+ VIEW COMPARISON:  Tibia/fibular radiographs 06/24/2021 FINDINGS: No erosion, periosteal reaction, or bone destruction. Normal ankle alignment. Preserved ankle mortise. No fracture. Small plantar calcaneal spur and Achilles tendon enthesophyte. Generalized soft tissue edema. No radiopaque foreign body or soft tissue gas. IMPRESSION: 1. Generalized soft tissue edema. No radiographic findings of osteomyelitis. 2. Small plantar calcaneal spur and Achilles tendon enthesophyte. Electronically Signed   By: Keith Rake M.D.   On: 04/28/2022 19:39   DG Foot Complete Left  Result Date: 04/28/2022 CLINICAL DATA:  rule out osteo.  Pre-existing left foot wound. EXAM: LEFT FOOT - COMPLETE 3+ VIEW COMPARISON:  None Available. FINDINGS: No cortical erosion or destruction. There is no evidence of fracture or dislocation. Degenerative changes of the distal interphalangeal joints. Diffuse mild subcutaneus soft tissue edema. IMPRESSION: 1. No radiographic findings suggest osteomyelitis. If high clinical suspicion, please consider MRI (with intravenous contrast if GFR greater than 30). 2.  No acute displaced fracture or dislocation. Electronically Signed   By: Iven Finn M.D.   On: 04/28/2022 19:39         Scheduled Meds:  buPROPion  150 mg Oral Daily   enoxaparin (LOVENOX) injection  30 mg Subcutaneous Q24H   feeding supplement  237 mL Oral Q24H   pantoprazole  40 mg Oral Daily   sodium chloride flush  3 mL Intravenous Q12H   vancomycin variable dose per unstable renal function (pharmacist dosing)   Does not apply See admin instructions   Continuous Infusions:  sodium chloride 125 mL/hr at 04/29/22 0103   cefTRIAXone (ROCEPHIN)  IV       LOS: 0 days    Time spent: 35 minutes    B[place] Flight A Jaydee Conran, MD [facility]   If 7PM-7AM, please contact night-coverage [email or web address]  04/29/2022, 7:32 AM

## 2022-04-29 NOTE — ED Notes (Signed)
Patient moved to hospital bed for comfort. Call light in reach. Pt states no further needs.

## 2022-04-30 ENCOUNTER — Encounter (HOSPITAL_COMMUNITY): Payer: Self-pay | Admitting: Internal Medicine

## 2022-04-30 ENCOUNTER — Observation Stay (HOSPITAL_COMMUNITY): Payer: Medicare Other

## 2022-04-30 DIAGNOSIS — S81802A Unspecified open wound, left lower leg, initial encounter: Secondary | ICD-10-CM | POA: Diagnosis not present

## 2022-04-30 DIAGNOSIS — Z515 Encounter for palliative care: Secondary | ICD-10-CM | POA: Diagnosis not present

## 2022-04-30 DIAGNOSIS — I96 Gangrene, not elsewhere classified: Secondary | ICD-10-CM

## 2022-04-30 DIAGNOSIS — N179 Acute kidney failure, unspecified: Secondary | ICD-10-CM | POA: Diagnosis present

## 2022-04-30 DIAGNOSIS — I739 Peripheral vascular disease, unspecified: Secondary | ICD-10-CM

## 2022-04-30 DIAGNOSIS — F0390 Unspecified dementia without behavioral disturbance: Secondary | ICD-10-CM | POA: Diagnosis not present

## 2022-04-30 DIAGNOSIS — N171 Acute kidney failure with acute cortical necrosis: Secondary | ICD-10-CM | POA: Diagnosis not present

## 2022-04-30 DIAGNOSIS — I130 Hypertensive heart and chronic kidney disease with heart failure and stage 1 through stage 4 chronic kidney disease, or unspecified chronic kidney disease: Secondary | ICD-10-CM | POA: Diagnosis present

## 2022-04-30 DIAGNOSIS — L03116 Cellulitis of left lower limb: Secondary | ICD-10-CM | POA: Diagnosis present

## 2022-04-30 DIAGNOSIS — I83022 Varicose veins of left lower extremity with ulcer of calf: Secondary | ICD-10-CM | POA: Diagnosis present

## 2022-04-30 DIAGNOSIS — Z823 Family history of stroke: Secondary | ICD-10-CM | POA: Diagnosis not present

## 2022-04-30 DIAGNOSIS — I70262 Atherosclerosis of native arteries of extremities with gangrene, left leg: Secondary | ICD-10-CM | POA: Diagnosis present

## 2022-04-30 DIAGNOSIS — L97923 Non-pressure chronic ulcer of unspecified part of left lower leg with necrosis of muscle: Secondary | ICD-10-CM

## 2022-04-30 DIAGNOSIS — Z20822 Contact with and (suspected) exposure to covid-19: Secondary | ICD-10-CM | POA: Diagnosis present

## 2022-04-30 DIAGNOSIS — M79605 Pain in left leg: Secondary | ICD-10-CM | POA: Diagnosis not present

## 2022-04-30 DIAGNOSIS — L97823 Non-pressure chronic ulcer of other part of left lower leg with necrosis of muscle: Secondary | ICD-10-CM | POA: Diagnosis present

## 2022-04-30 DIAGNOSIS — R54 Age-related physical debility: Secondary | ICD-10-CM | POA: Diagnosis present

## 2022-04-30 DIAGNOSIS — L0390 Cellulitis, unspecified: Secondary | ICD-10-CM

## 2022-04-30 DIAGNOSIS — N1831 Chronic kidney disease, stage 3a: Secondary | ICD-10-CM | POA: Diagnosis not present

## 2022-04-30 DIAGNOSIS — Z79899 Other long term (current) drug therapy: Secondary | ICD-10-CM | POA: Diagnosis not present

## 2022-04-30 DIAGNOSIS — F03918 Unspecified dementia, unspecified severity, with other behavioral disturbance: Secondary | ICD-10-CM | POA: Diagnosis present

## 2022-04-30 DIAGNOSIS — G2581 Restless legs syndrome: Secondary | ICD-10-CM | POA: Diagnosis present

## 2022-04-30 DIAGNOSIS — K219 Gastro-esophageal reflux disease without esophagitis: Secondary | ICD-10-CM | POA: Diagnosis present

## 2022-04-30 DIAGNOSIS — F0393 Unspecified dementia, unspecified severity, with mood disturbance: Secondary | ICD-10-CM | POA: Diagnosis present

## 2022-04-30 DIAGNOSIS — I872 Venous insufficiency (chronic) (peripheral): Secondary | ICD-10-CM | POA: Diagnosis present

## 2022-04-30 DIAGNOSIS — F329 Major depressive disorder, single episode, unspecified: Secondary | ICD-10-CM | POA: Diagnosis present

## 2022-04-30 DIAGNOSIS — L97222 Non-pressure chronic ulcer of left calf with fat layer exposed: Secondary | ICD-10-CM | POA: Diagnosis present

## 2022-04-30 DIAGNOSIS — Z809 Family history of malignant neoplasm, unspecified: Secondary | ICD-10-CM | POA: Diagnosis not present

## 2022-04-30 DIAGNOSIS — E876 Hypokalemia: Secondary | ICD-10-CM | POA: Diagnosis present

## 2022-04-30 DIAGNOSIS — I509 Heart failure, unspecified: Secondary | ICD-10-CM | POA: Diagnosis present

## 2022-04-30 DIAGNOSIS — Z66 Do not resuscitate: Secondary | ICD-10-CM | POA: Diagnosis present

## 2022-04-30 DIAGNOSIS — M79662 Pain in left lower leg: Secondary | ICD-10-CM | POA: Diagnosis present

## 2022-04-30 DIAGNOSIS — J449 Chronic obstructive pulmonary disease, unspecified: Secondary | ICD-10-CM | POA: Diagnosis present

## 2022-04-30 LAB — PHOSPHORUS: Phosphorus: 2.7 mg/dL (ref 2.5–4.6)

## 2022-04-30 LAB — CBC
HCT: 34 % — ABNORMAL LOW (ref 36.0–46.0)
Hemoglobin: 11.8 g/dL — ABNORMAL LOW (ref 12.0–15.0)
MCH: 33.4 pg (ref 26.0–34.0)
MCHC: 34.7 g/dL (ref 30.0–36.0)
MCV: 96.3 fL (ref 80.0–100.0)
Platelets: 292 10*3/uL (ref 150–400)
RBC: 3.53 MIL/uL — ABNORMAL LOW (ref 3.87–5.11)
RDW: 12.7 % (ref 11.5–15.5)
WBC: 11.1 10*3/uL — ABNORMAL HIGH (ref 4.0–10.5)
nRBC: 0 % (ref 0.0–0.2)

## 2022-04-30 LAB — BASIC METABOLIC PANEL
Anion gap: 13 (ref 5–15)
BUN: 36 mg/dL — ABNORMAL HIGH (ref 8–23)
CO2: 21 mmol/L — ABNORMAL LOW (ref 22–32)
Calcium: 8.5 mg/dL — ABNORMAL LOW (ref 8.9–10.3)
Chloride: 105 mmol/L (ref 98–111)
Creatinine, Ser: 1.21 mg/dL — ABNORMAL HIGH (ref 0.44–1.00)
GFR, Estimated: 45 mL/min — ABNORMAL LOW (ref >60)
Glucose, Bld: 110 mg/dL — ABNORMAL HIGH (ref 70–99)
Potassium: 2.8 mmol/L — ABNORMAL LOW (ref 3.5–5.1)
Sodium: 139 mmol/L (ref 135–145)

## 2022-04-30 LAB — VANCOMYCIN, RANDOM: Vancomycin Rm: 11 ug/mL

## 2022-04-30 MED ORDER — ENOXAPARIN SODIUM 40 MG/0.4ML IJ SOSY
40.0000 mg | PREFILLED_SYRINGE | INTRAMUSCULAR | Status: DC
  Administered 2022-05-01 – 2022-05-02 (×2): 40 mg via SUBCUTANEOUS
  Filled 2022-04-30 (×2): qty 0.4

## 2022-04-30 MED ORDER — VANCOMYCIN HCL IN DEXTROSE 1-5 GM/200ML-% IV SOLN
1000.0000 mg | INTRAVENOUS | Status: DC
  Administered 2022-04-30 – 2022-05-02 (×2): 1000 mg via INTRAVENOUS
  Filled 2022-04-30 (×2): qty 200

## 2022-04-30 MED ORDER — MUPIROCIN 2 % EX OINT
1.0000 "application " | TOPICAL_OINTMENT | Freq: Two times a day (BID) | CUTANEOUS | Status: AC
  Administered 2022-04-30 – 2022-05-04 (×9): 1 via NASAL
  Filled 2022-04-30 (×4): qty 22

## 2022-04-30 MED ORDER — CHLORHEXIDINE GLUCONATE CLOTH 2 % EX PADS
6.0000 | MEDICATED_PAD | Freq: Every day | CUTANEOUS | Status: AC
  Administered 2022-04-30 – 2022-05-04 (×5): 6 via TOPICAL

## 2022-04-30 MED ORDER — POTASSIUM CHLORIDE CRYS ER 20 MEQ PO TBCR
40.0000 meq | EXTENDED_RELEASE_TABLET | Freq: Three times a day (TID) | ORAL | Status: AC
  Administered 2022-04-30 – 2022-05-01 (×4): 40 meq via ORAL
  Filled 2022-04-30 (×4): qty 2

## 2022-04-30 MED ORDER — ALUM & MAG HYDROXIDE-SIMETH 200-200-20 MG/5ML PO SUSP
30.0000 mL | ORAL | Status: DC | PRN
  Administered 2022-04-30: 30 mL via ORAL
  Filled 2022-04-30: qty 30

## 2022-04-30 NOTE — Consult Note (Signed)
ORTHOPAEDIC CONSULTATION  REQUESTING PHYSICIAN: Barb Merino, MD  Chief Complaint: Painful gangrenous changes left leg and left foot.  HPI: [name] is a 81 y.o. female who presents with painful gangrene left lower extremity.  Patient has been currently treated at the wound center.  She reports progressive increasing pain in the left lower extremity.  Past Medical History:  Diagnosis Date   Arthritis    Asthma    Chronic renal impairment, stage 3 (moderate) (HCC)    Depression    GERD (gastroesophageal reflux disease)    Hearing loss    Hypertension    Neck pain    Numbness    Restless legs    Sciatica    Past Surgical History:  Procedure Laterality Date   arm surgery     due to dog bite   CATARACT EXTRACTION, BILATERAL     OTHER SURGICAL HISTORY     bone graft - jaw    TOOTH EXTRACTION     Social History   Socioeconomic History   Marital status: Married    Spouse name: Not on file   Number of children: 2   Years of education: associates degree   Highest education level: Not on file  Occupational History   Occupation: Retired  Tobacco Use   Smoking status: Former    Types: Cigarettes    Quit date: 1999    Years since quitting: 24.4   Smokeless tobacco: Never  Vaping Use   Vaping Use: Never used  Substance and Sexual Activity   Alcohol use: Yes    Comment: 2 drinks per day   Drug use: No   Sexual activity: Not on file  Other Topics Concern   Not on file  Social History Narrative   Lives at home with husband.   Right-handed.   No caffeine use.   Social Determinants of Health   Financial Resource Strain: Not on file  Food Insecurity: Not on file  Transportation Needs: Not on file  Physical Activity: Not on file  Stress: Not on file  Social Connections: Not on file   Family History  Problem Relation Age of Onset   Stroke Mother    Hypertension Father    Heart disease Father    Heart attack Father    Cancer Sister        unsure of  type   - negative except otherwise stated in the family history section Allergies  Allergen Reactions   Meloxicam Rash   Prior to Admission medications   Medication Sig Start Date End Date Taking? Authorizing Provider  acetaminophen (TYLENOL) 325 MG tablet Take 2 tablets (650 mg total) by mouth every 6 (six) hours as needed for mild pain (or Fever >/= 101). 07/01/21  Yes Gonfa, Taye T, MD  buPROPion (WELLBUTRIN XL) 150 MG 24 hr tablet Take 150 mg by mouth daily.   Yes [provider]  diphenhydrAMINE (BENADRYL) 25 mg capsule Take 25 mg by mouth daily as needed for itching or allergies.   Yes [provider]  feeding supplement (ENSURE ENLIVE / ENSURE PLUS) LIQD Take 237 mLs by mouth daily. 07/02/21  Yes Mercy Riding, MD  furosemide (LASIX) 20 MG tablet Take 20 mg by mouth daily. 04/24/22  Yes [provider]  gabapentin (NEURONTIN) 100 MG capsule Take 2 capsules (200 mg total) by mouth 3 (three) times daily. 07/01/21  Yes Mercy Riding, MD  Hyprom-Naphaz-Polysorb-Zn Sulf (CLEAR EYES COMPLETE OP) Place 1 drop into both eyes  2 (two) times daily as needed (redness).   Yes [provider]  losartan-hydrochlorothiazide (HYZAAR) 50-12.5 MG tablet Take 1 tablet by mouth daily. 03/28/22  Yes [provider]  Multiple Vitamin (MULTI-VITAMINS) TABS Take 1 tablet by mouth daily.   Yes [provider]  nystatin (MYCOSTATIN/NYSTOP) powder Apply 1 application. topically 2 (two) times daily. 03/28/22  Yes [provider]  omeprazole (PRILOSEC) 20 MG capsule Take 20 mg by mouth daily as needed (heartburn).   Yes [provider]  silver sulfADIAZINE (SILVADENE) 1 % cream Apply topically 2 (two) times daily. Patient taking differently: Apply 1 application. topically 2 (two) times daily. 07/01/21  Yes Mercy Riding, MD  theophylline (UNIPHYL) 400 MG 24 hr tablet Take 400 mg by mouth daily. 06/15/17  Yes [provider]  bethanechol (URECHOLINE)  10 MG tablet Take 1 tablet (10 mg total) by mouth 3 (three) times daily. Patient not taking: Reported on 04/28/2022 07/01/21   Mercy Riding, MD  cefdinir (OMNICEF) 300 MG capsule Take 1 capsule (300 mg total) by mouth every 12 (twelve) hours. Patient not taking: Reported on 04/28/2022 07/01/21   Mercy Riding, MD  linezolid (ZYVOX) 600 MG tablet Take 1 tablet (600 mg total) by mouth every 12 (twelve) hours. Patient not taking: Reported on 04/28/2022 07/01/21   Mercy Riding, MD  oxyCODONE 10 MG TABS Take 1 tablet (10 mg total) by mouth as needed (about an hour before dressing change). Patient not taking: Reported on 04/28/2022 07/01/21   Mercy Riding, MD  polyethylene glycol (MIRALAX / GLYCOLAX) 17 g packet Take 17 g by mouth daily as needed for mild constipation. Patient not taking: Reported on 04/28/2022 07/01/21   Mercy Riding, MD  senna-docusate (SENOKOT-S) 8.6-50 MG tablet Take 1 tablet by mouth 2 (two) times daily between meals as needed for mild constipation. Patient not taking: Reported on 04/28/2022 07/01/21   Mercy Riding, MD   DG Ankle Complete Left  Result Date: 04/28/2022 CLINICAL DATA:  Wound. EXAM: LEFT ANKLE COMPLETE - 3+ VIEW COMPARISON:  Tibia/fibular radiographs 06/24/2021 FINDINGS: No erosion, periosteal reaction, or bone destruction. Normal ankle alignment. Preserved ankle mortise. No fracture. Small plantar calcaneal spur and Achilles tendon enthesophyte. Generalized soft tissue edema. No radiopaque foreign body or soft tissue gas. IMPRESSION: 1. Generalized soft tissue edema. No radiographic findings of osteomyelitis. 2. Small plantar calcaneal spur and Achilles tendon enthesophyte. Electronically Signed   By: Keith Rake M.D.   On: 04/28/2022 19:39   DG Foot Complete Left  Result Date: 04/28/2022 CLINICAL DATA:  rule out osteo.  Pre-existing left foot wound. EXAM: LEFT FOOT - COMPLETE 3+ VIEW COMPARISON:  None Available. FINDINGS: No cortical erosion or destruction. There is no evidence of  fracture or dislocation. Degenerative changes of the distal interphalangeal joints. Diffuse mild subcutaneus soft tissue edema. IMPRESSION: 1. No radiographic findings suggest osteomyelitis. If high clinical suspicion, please consider MRI (with intravenous contrast if GFR greater than 30). 2.  No acute displaced fracture or dislocation. Electronically Signed   By: Iven Finn M.D.   On: 04/28/2022 19:39   - pertinent xrays, CT, MRI studies were reviewed and independently interpreted  Positive ROS: All other systems have been reviewed and were otherwise negative with the exception of those mentioned in the HPI and as above.  Physical Exam: General: Alert, no acute distress Psychiatric: Patient is competent for consent with normal mood and affect Lymphatic: No axillary or cervical lymphadenopathy Cardiovascular: No pedal  edema Respiratory: No cyanosis, no use of accessory musculature GI: No organomegaly, abdomen is soft and non-tender    Images:  @ENCIMAGES @  Labs:  Lab Results  Component Value Date   HGBA1C 5.9 (H) 06/24/2021   ESRSEDRATE 90 (H) 04/28/2022   CRP 21.1 (H) 04/28/2022   CRP 3.9 08/28/2017   REPTSTATUS 06/28/2021 FINAL 06/25/2021   REPTSTATUS 06/30/2021 FINAL 06/25/2021   GRAMSTAIN  06/25/2021    RARE WBC PRESENT, PREDOMINANTLY PMN NO ORGANISMS SEEN Performed at [facility] [address], [place], [place] [address]    GRAMSTAIN NO WBC SEEN FEW GRAM POSITIVE COCCI  06/25/2021   CULT  06/25/2021    FEW METHICILLIN RESISTANT STAPHYLOCOCCUS AUREUS RARE ACHROMOBACTER XYLOSOXIDANS    CULT  06/25/2021    ABUNDANT METHICILLIN RESISTANT STAPHYLOCOCCUS AUREUS WITHIN MIXED ORGANISMS Performed at [facility], [place] [address] [place], [place] [address]    LABORGA METHICILLIN RESISTANT STAPHYLOCOCCUS AUREUS 06/25/2021   LABORGA ACHROMOBACTER XYLOSOXIDANS 06/25/2021   LABORGA METHICILLIN RESISTANT STAPHYLOCOCCUS AUREUS 06/25/2021    Lab Results   Component Value Date   ALBUMIN 2.9 (L) 04/29/2022   ALBUMIN 2.3 (L) 06/29/2021   ALBUMIN 2.3 (L) 06/28/2021        Latest Ref Rng & Units 04/30/2022    4:39 AM 04/29/2022    5:00 AM 04/28/2022    8:05 PM  CBC EXTENDED  WBC 4.0 - 10.5 K/uL 11.1   8.7   9.6    RBC 3.87 - 5.11 MIL/uL 3.53   3.75   4.49    Hemoglobin 12.0 - 15.0 g/dL 11.8   12.5   14.6    HCT 36.0 - 46.0 % 34.0   36.5   43.1    Platelets 150 - 400 K/uL 292   295   389      Neurologic: Patient does not have protective sensation bilateral lower extremities.   MUSCULOSKELETAL:   Skin: Examination patient has ischemic changes to her toes with decreased turgor.  There is necrotic changes circumferentially around the foot and ankle and ischemic ulcers that extend up to mid calf.  Patient's GFR is 25 CRP 21.1 sed rate 90.  Her white cell count is increased to 11.1 from 8.7.  Albumin 2.9.  Patient has intense pain with attempted light touch to the left lower extremity.  Vascular ultrasound has been ordered.  Radiographs do not show any air in the soft tissue.  Assessment: Assessment: Gangrenous changes left lower extremity.  Plan: Vascular studies have been ordered but I do not think patient can tolerate it secondary to the pain.  Patient has dementia and she states her husband is also hospitalized at [facility] long.  I have consulted vascular surgery to obtain their opinion as well.  I recommend an above-the-knee amputation on the left.  Thank you for the consult and the opportunity to see Ms. [place], MD [place] [phone] 8:05 AM

## 2022-04-30 NOTE — Progress Notes (Signed)
  Transition of Care [facility]) Screening Note   Patient Details  Name: [name] Date of Birth: [birth year]   Transition of Care [facility]) CM/SW Contact:    Tom-Johnson, Hershal Coria, RN Phone Number: 04/30/2022, 1:51 PM  CM spoke with patient at bedside about needs for post hospital transition. Admitted for Lt foot Cellulitis, Vascular following.  From home with husband. States husband is also at [facility] at this time.  Does not have children, has nieces and nephews. Uses [employer] services every other Tuesday. Next door neighbor gets her mail and also uses [facility].  PCP is Rene Kocher, Nira Retort, MD and uses [facility] in [place]. Active with [facility] for home health disciplines.   Transition of Care Department [facility]) has reviewed patient and no TOC needs or recommendations have been identified at this time. TOC will continue to monitor patient advancement through interdisciplinary progression rounds. If new patient transition needs arise, please place a TOC consult.

## 2022-04-30 NOTE — Consult Note (Addendum)
Hospital Consult    Reason for Consult: Left leg wounds Referring Physician: Dr. Sloan Leiter MRN #:  [id number]  History of Present Illness: This is a 81 y.o. female followed at the wound care center for left lower extremity wounds.  She has now increasing pain in the left lower extremity with cellulitis and was admitted to [facility] long now transferred here for definitive management.  She does have underlying dementia and this appears to be limiting her exam today.  She is unsure if she had fevers.  She does have exquisite pain of the left leg.  Past Medical History:  Diagnosis Date   Arthritis    Asthma    Chronic renal impairment, stage 3 (moderate) (HCC)    Depression    GERD (gastroesophageal reflux disease)    Hearing loss    Hypertension    Neck pain    Numbness    Restless legs    Sciatica     Past Surgical History:  Procedure Laterality Date   arm surgery     due to dog bite   CATARACT EXTRACTION, BILATERAL     OTHER SURGICAL HISTORY     bone graft - jaw    TOOTH EXTRACTION      Allergies  Allergen Reactions   Meloxicam Rash    Prior to Admission medications   Medication Sig Start Date End Date Taking? Authorizing Provider  acetaminophen (TYLENOL) 325 MG tablet Take 2 tablets (650 mg total) by mouth every 6 (six) hours as needed for mild pain (or Fever >/= 101). 07/01/21  Yes Gonfa, Taye T, MD  buPROPion (WELLBUTRIN XL) 150 MG 24 hr tablet Take 150 mg by mouth daily.   Yes [provider]  diphenhydrAMINE (BENADRYL) 25 mg capsule Take 25 mg by mouth daily as needed for itching or allergies.   Yes [provider]  feeding supplement (ENSURE ENLIVE / ENSURE PLUS) LIQD Take 237 mLs by mouth daily. 07/02/21  Yes Mercy Riding, MD  furosemide (LASIX) 20 MG tablet Take 20 mg by mouth daily. 04/24/22  Yes [provider]  gabapentin (NEURONTIN) 100 MG capsule Take 2 capsules (200 mg total) by mouth 3 (three) times daily. 07/01/21  Yes Mercy Riding, MD   Hyprom-Naphaz-Polysorb-Zn Sulf (CLEAR EYES COMPLETE OP) Place 1 drop into both eyes 2 (two) times daily as needed (redness).   Yes [provider]  losartan-hydrochlorothiazide (HYZAAR) 50-12.5 MG tablet Take 1 tablet by mouth daily. 03/28/22  Yes [provider]  Multiple Vitamin (MULTI-VITAMINS) TABS Take 1 tablet by mouth daily.   Yes [provider]  nystatin (MYCOSTATIN/NYSTOP) powder Apply 1 application. topically 2 (two) times daily. 03/28/22  Yes [provider]  omeprazole (PRILOSEC) 20 MG capsule Take 20 mg by mouth daily as needed (heartburn).   Yes [provider]  silver sulfADIAZINE (SILVADENE) 1 % cream Apply topically 2 (two) times daily. Patient taking differently: Apply 1 application. topically 2 (two) times daily. 07/01/21  Yes Mercy Riding, MD  theophylline (UNIPHYL) 400 MG 24 hr tablet Take 400 mg by mouth daily. 06/15/17  Yes [provider]  bethanechol (URECHOLINE) 10 MG tablet Take 1 tablet (10 mg total) by mouth 3 (three) times daily. Patient not taking: Reported on 04/28/2022 07/01/21   Mercy Riding, MD  cefdinir (OMNICEF) 300 MG capsule Take 1 capsule (300 mg total) by mouth every 12 (twelve) hours. Patient not taking: Reported on 04/28/2022 07/01/21   Mercy Riding, MD  linezolid (  ZYVOX) 600 MG tablet Take 1 tablet (600 mg total) by mouth every 12 (twelve) hours. Patient not taking: Reported on 04/28/2022 07/01/21   Mercy Riding, MD  oxyCODONE 10 MG TABS Take 1 tablet (10 mg total) by mouth as needed (about an hour before dressing change). Patient not taking: Reported on 04/28/2022 07/01/21   Mercy Riding, MD  polyethylene glycol (MIRALAX / GLYCOLAX) 17 g packet Take 17 g by mouth daily as needed for mild constipation. Patient not taking: Reported on 04/28/2022 07/01/21   Mercy Riding, MD  senna-docusate (SENOKOT-S) 8.6-50 MG tablet Take 1 tablet by mouth 2 (two) times daily between meals as needed for mild constipation. Patient not  taking: Reported on 04/28/2022 07/01/21   Mercy Riding, MD    Social History   Socioeconomic History   Marital status: Married    Spouse name: Not on file   Number of children: 2   Years of education: associates degree   Highest education level: Not on file  Occupational History   Occupation: Retired  Tobacco Use   Smoking status: Former    Types: Cigarettes    Quit date: 1999    Years since quitting: 24.4   Smokeless tobacco: Never  Vaping Use   Vaping Use: Never used  Substance and Sexual Activity   Alcohol use: Yes    Comment: 2 drinks per day   Drug use: No   Sexual activity: Not on file  Other Topics Concern   Not on file  Social History Narrative   Lives at home with husband.   Right-handed.   No caffeine use.   Social Determinants of Health   Financial Resource Strain: Not on file  Food Insecurity: Not on file  Transportation Needs: Not on file  Physical Activity: Not on file  Stress: Not on file  Social Connections: Not on file  Intimate Partner Violence: Not on file     Family History  Problem Relation Age of Onset   Stroke Mother    Hypertension Father    Heart disease Father    Heart attack Father    Cancer Sister        unsure of type    Review of Systems  Constitutional: Negative.   HENT: Negative.    Respiratory: Negative.    Cardiovascular:  Positive for leg swelling.  Musculoskeletal:        Left leg wound  Skin: Negative.   Neurological: Negative.   Endo/Heme/Allergies: Negative.   Psychiatric/Behavioral:  Positive for memory loss.     Physical Examination  Vitals:   04/30/22 0500 04/30/22 0847  BP: 121/68 127/77  Pulse: 97 89  Resp: 18 16  Temp: 98.3 F (36.8 C) 98.4 F (36.9 C)  SpO2: 94% 96%   Body mass index is 33.19 kg/m.  Physical Exam HENT:     Head: Normocephalic.     Nose: Nose normal.  Eyes:     Pupils: Pupils are equal, round, and reactive to light.  Cardiovascular:     Pulses:          Dorsalis pedis  pulses are 1+ on the right side.     Comments: She does have a palpable left common femoral pulse I cannot palpate pulses below this due to exquisite tenderness palpation Pulmonary:     Effort: Pulmonary effort is normal.  Abdominal:     General: Abdomen is flat.     Palpations: Abdomen is soft.  Musculoskeletal:  General: Normal range of motion.     Comments: Dressing lle in place  Neurological:     General: No focal deficit present.     Mental Status: She is alert.  Psychiatric:        Mood and Affect: Mood normal.        Thought Content: Thought content normal.        Judgment: Judgment normal.     CBC    Component Value Date/Time   WBC 11.1 (H) 04/30/2022 0439   RBC 3.53 (L) 04/30/2022 0439   HGB 11.8 (L) 04/30/2022 0439   HCT 34.0 (L) 04/30/2022 0439   PLT 292 04/30/2022 0439   MCV 96.3 04/30/2022 0439   MCH 33.4 04/30/2022 0439   MCHC 34.7 04/30/2022 0439   RDW 12.7 04/30/2022 0439   LYMPHSABS 1.2 06/26/2021 0242   MONOABS 1.3 (H) 06/26/2021 0242   EOSABS 0.1 06/26/2021 0242   BASOSABS 0.1 06/26/2021 0242    BMET    Component Value Date/Time   NA 139 04/30/2022 0439   K 2.8 (L) 04/30/2022 0439   CL 105 04/30/2022 0439   CO2 21 (L) 04/30/2022 0439   GLUCOSE 110 (H) 04/30/2022 0439   BUN 36 (H) 04/30/2022 0439   CREATININE 1.21 (H) 04/30/2022 0439   CALCIUM 8.5 (L) 04/30/2022 0439   GFRNONAA 45 (L) 04/30/2022 0439    COAGS: Lab Results  Component Value Date   INR 1.0 06/30/2021   INR 1.0 06/24/2021     Non-Invasive Vascular Imaging:   Left lower extremity arterial duplex ordered   ASSESSMENT/PLAN: This is a 81 y.o. female with extensive left lower extremity wounds.  Possibly will need left lower extremity amputation.  I have ordered an arterial duplex if this is abnormal we will proceed with angiography tomorrow otherwise will defer amputation to Dr. Purvis Sheffield C. Donzetta Matters, MD [facility] Office:  [phone] Pager: [phone]  Addendum: I have reviewed her duplex not surprisingly she has significant tibial disease but I do not think any intervention would really improve her chance of healing her wounds.  I agree with Dr. Sharol Given that she would be best served with above-knee amputation on the left and I am happy to help facilitate this in any way necessary.  Servando Snare

## 2022-04-30 NOTE — Care Management Obs Status (Signed)
[place] NOTIFICATION   Patient Details  Name: [name] MRN: [id number] Date of Birth: [birth year]   Medicare Observation Status Notification Given:  Yes    Tom-Johnson, [name], RN 04/30/2022, 8:55 AM

## 2022-04-30 NOTE — Progress Notes (Signed)
Lower extremity arterial left study completed.   Please see CV Proc for preliminary results.   Darlin Coco, RDMS, RVT

## 2022-04-30 NOTE — Progress Notes (Signed)
Pharmacy Antibiotic Note  [name] is a 81 y.o. female admitted on 04/28/2022 with cellulitis.  Pharmacy has been consulted for Vancomycin dosing.   AKI on admit, Scr 2.79.  Vancomycin 1500 mg IV loading dose given 6/5 at 8pm.  Random Vancomycin level = 11 mcg/ml today.  Creatinine improved to 1.21.   Ortho consulted; gangrenous changes, L AKA recommended.  VVS also consulted. LLE arterial duplex today, may need angiography.  Plan: Begin Vancomycin 1 gm IV Q 36 hrs.  Goal AUC 400-550, or Vanc trough levels 15-20 mcg/ml Expected AUC: 498 SCr used: 1.21 Also on Ceftriaxone 2gm IV q24h. Follow renal function, clinical progress, antibiotic and procedure plans.   Height: 5\' 4"  (162.6 cm) Weight: 87.7 kg (193 lb 5.5 oz) IBW/kg (Calculated) : 54.7  Temp (24hrs), Avg:98.9 F (37.2 C), Min:98 F (36.7 C), Max:101.8 F (38.8 C)  Recent Labs  Lab 04/28/22 2005 04/28/22 2052 04/29/22 0500 04/30/22 0439  WBC 9.6  --  8.7 11.1*  CREATININE 2.79*  --  1.97* 1.21*  LATICACIDVEN 1.5 2.2*  --   --   VANCORANDOM  --   --   --  11    Estimated Creatinine Clearance: 39.1 mL/min (A) (by C-G formula based on SCr of 1.21 mg/dL (H)).    Allergies  Allergen Reactions   Meloxicam Rash    Antimicrobials this admission: Vancomycin 6/5 >>  Ceftriaxone 6/5 >>  Bactroban nasal 6/7 >>(6/11)  Dose adjustments this admission: 6/7 VR: 11 (after LD 1500 mg on 6/5 8pm) > begin 1gm IV q36h  Microbiology results: 6/6 MRSA PCR: positive 6/6 COVID and flu: negative  Thank you for allowing pharmacy to be a part of this patient's care.  8/7, RPh 04/30/2022 10:41 AM

## 2022-04-30 NOTE — Progress Notes (Signed)
New Admission Note:   Arrival Method: Stretcher via [facility] from [facility] Orientation: alert x 2-3 Telemetry: box 7 Assessment: Completed Skin:see flowsheet IV: NSL Pain: with activity Tubes: none Safety Measures: Safety Fall Prevention Plan has been discussed Admission: Completed [facility] Orientation: Patient has been orientated to the room, unit and staff.  Family: none at side ,hus[facility] ,[facility]  Orders have been reviewed and implemented. Will continue to monitor the patient. Call light has been placed within reach and bed alarm has been activated.   Artemio Aly BSN, RN Phone number: [phone]

## 2022-04-30 NOTE — Progress Notes (Signed)
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  In the emergency room she was found with acute kidney injury and infected left leg.   Assessment & Plan:   Cellulitis of left lower extremity, underlying chronic ischemic wound and skin necrosis: Blood cultures negative so far. MRSA swab positive. Currently remains on ceftriaxone and vancomycin. ABI ordered, very painful leg and nonhealing ischemic ulcer.  Likely nonsalvageable leg. Followed by vascular and orthopedics, will likely need above-knee amputation.  Acute kidney injury on CKD stage IIIa: Known baseline creatinine 0.9-1.2.  Treated with IV fluids.  Significantly improved and normalizing.  Continue to monitor.  Hypokalemia: Persistent.  Replace aggressively.  Recheck tomorrow morning.  Essential hypertension: Holding losartan due to AKI.  Blood pressure stable.  Dementia: With mild behavioral changes.  Fall precautions.  Delirium precautions.  Continue on bupropion.  Goal of care discussion: DNR as recorded by patient herself. Patient with extreme debility, dementia.  Currently in need for left above-knee amputation to avoid further spread of infection. Husband is primary Media planner, he is admitted at another hospital with TIA and designates his daughter Ms. Lattie Haw as Marine scientist. Called and discussed with Ms. Lattie Haw in details, she is also a geriatric nurse practitioner.  She wants to help make decisions for both of them.   DVT prophylaxis: enoxaparin (LOVENOX) injection 30 mg Start: 04/29/22 1000   Code Status: DNR Family Communication: Stepdaughter Ms. Lattie Haw on the  phone Disposition Plan: Status is: Inpatient Remains inpatient appropriate because: Significant leg infection, anticipate inpatient surgical procedure.     Consultants:  Orthopedics Vascular surgery  Procedures:  None  Antimicrobials:  Vancomycin Rocephin 6/5---   Subjective: Patient was seen and examined.  She was very worried about me removing her dressing because it is very painful.  She asked me to give her some anesthesia and make sure to saline soaked the dressing before removal.  Denies any pain. Patient tells me that she will be agreeable to do surgery once we finished all the test. I asked her about communication and she was agreeable for me to talk to her step daughter and help her make decisions for her.  Objective: Vitals:   04/29/22 2218 04/30/22 0200 04/30/22 0500 04/30/22 0847  BP: (!) 111/57 112/69 121/68 127/77  Pulse: (!) 104 (!) 105 97 89  Resp: 18 18 18 16   Temp: 98.5 F (36.9 C) 98.6 F (37 C) 98.3 F (36.8 C) 98.4 F (36.9 C)  TempSrc: Oral Oral Oral Oral  SpO2: 92% 94% 94% 96%  Weight:   87.7 kg   Height:   5\' 4"  (1.626 m)     Intake/Output Summary (Last 24 hours) at 04/30/2022 1134 Last data filed at 04/30/2022 0900 Gross per 24 hour  Intake 771.67 ml  Output 400 ml  Net 371.67 ml   Filed Weights   04/28/22 1901 04/30/22 0500  Weight: 84.4 kg 87.7 kg    Examination:  General exam: Appears calm and comfortable at rest.  Slightly anxious. Respiratory system: Clear to auscultation. Respiratory effort normal.  No added sounds. Cardiovascular system: S1 & S2 heard, RRR. No JVD, murmurs, rubs, gallops or clicks. Gastrointestinal system: Abdomen is nondistended,  soft and nontender. No organomegaly or masses felt. Normal bowel sounds heard. Central nervous system: Alert and awake.  Oriented x2-3.  She is mostly aware about the situation.  Moves all extremities. Extremities: Symmetric 5 x 5 power. Skin:  Left lower extremity with redness, multiple  necrotic areas, no palpable pedal pulses.  Pictures in the chart.    Data Reviewed: I have personally reviewed following labs and imaging studies  CBC: Recent Labs  Lab 04/28/22 2005 04/29/22 0500 04/30/22 0439  WBC 9.6 8.7 11.1*  HGB 14.6 12.5 11.8*  HCT 43.1 36.5 34.0*  MCV 96.0 97.3 96.3  PLT 389 295 [id number]   Basic Metabolic Panel: Recent Labs  Lab 04/28/22 2005 04/29/22 0500 04/30/22 0439  NA 139 136 139  K 3.0* 3.1* 2.8*  CL 97* 103 105  CO2 25 21* 21*  GLUCOSE 107* 93 110*  BUN 77* 63* 36*  CREATININE 2.79* 1.97* 1.21*  CALCIUM 9.8 8.4* 8.5*  MG  --  2.2  --    GFR: Estimated Creatinine Clearance: 39.1 mL/min (A) (by C-G formula based on SCr of 1.21 mg/dL (H)). Liver Function Tests: Recent Labs  Lab 04/29/22 0500  AST 20  ALT 13  ALKPHOS 63  BILITOT 0.8  PROT 6.4*  ALBUMIN 2.9*   No results for input(s): LIPASE, AMYLASE in the last 168 hours. No results for input(s): AMMONIA in the last 168 hours. Coagulation Profile: No results for input(s): INR, PROTIME in the last 168 hours. Cardiac Enzymes: No results for input(s): CKTOTAL, CKMB, CKMBINDEX, TROPONINI in the last 168 hours. BNP (last 3 results) No results for input(s): PROBNP in the last 8760 hours. HbA1C: No results for input(s): HGBA1C in the last 72 hours. CBG: Recent Labs  Lab 04/29/22 1838  GLUCAP 114*   Lipid Profile: No results for input(s): CHOL, HDL, LDLCALC, TRIG, CHOLHDL, LDLDIRECT in the last 72 hours. Thyroid Function Tests: No results for input(s): TSH, T4TOTAL, FREET4, T3FREE, THYROIDAB in the last 72 hours. Anemia Panel: No results for input(s): VITAMINB12, FOLATE, FERRITIN, TIBC, IRON, RETICCTPCT in the last 72 hours. Sepsis Labs: Recent Labs  Lab 04/28/22 2005 04/28/22 2052  LATICACIDVEN 1.5 2.2*    Recent Results (from the past 240 hour(s))  Resp Panel by RT-PCR (Flu A&B, Covid) Anterior Nasal Swab     Status: None   Collection Time: 04/29/22 12:19 AM   Specimen:  Anterior Nasal Swab  Result Value Ref Range Status   SARS Coronavirus 2 by RT PCR NEGATIVE NEGATIVE Final    Comment: (NOTE) SARS-CoV-2 target nucleic acids are NOT DETECTED.  The SARS-CoV-2 RNA is generally detectable in upper respiratory specimens during the acute phase of infection. The lowest concentration of SARS-CoV-2 viral copies this assay can detect is 138 copies/mL. A negative result does not preclude SARS-Cov-2 infection and should not be used as the sole basis for treatment or other patient management decisions. A negative result may occur with  improper specimen collection/handling, submission of specimen other than nasopharyngeal swab, presence of viral mutation(s) within the areas targeted by this assay, and inadequate number of viral copies(<138 copies/mL). A negative result must be combined with clinical observations, patient history, and epidemiological information. The expected result is Negative.  Fact Sheet for Patients:  EntrepreneurPulse.com.au  Fact Sheet for Healthcare Providers:  IncredibleEmployment.be  This test is no t yet approved or cleared by the [place] FDA and  has been authorized for detection and/or diagnosis of SARS-CoV-2 by FDA under an Emergency Use Authorization (  EUA). This EUA will remain  in effect (meaning this test can be used) for the duration of the COVID-19 declaration under Section 564(b)(1) of the Act, 21 U.S.C.section 360bbb-3(b)(1), unless the authorization is terminated  or revoked sooner.       Influenza A by PCR NEGATIVE NEGATIVE Final   Influenza B by PCR NEGATIVE NEGATIVE Final    Comment: (NOTE) The Xpert Xpress SARS-CoV-2/FLU/RSV plus assay is intended as an aid in the diagnosis of influenza from Nasopharyngeal swab specimens and should not be used as a sole basis for treatment. Nasal washings and aspirates are unacceptable for Xpert Xpress SARS-CoV-2/FLU/RSV testing.  Fact  Sheet for Patients: EntrepreneurPulse.com.au  Fact Sheet for Healthcare Providers: IncredibleEmployment.be  This test is not yet approved or cleared by the [place] FDA and has been authorized for detection and/or diagnosis of SARS-CoV-2 by FDA under an Emergency Use Authorization (EUA). This EUA will remain in effect (meaning this test can be used) for the duration of the COVID-19 declaration under Section 564(b)(1) of the Act, 21 U.S.C. section 360bbb-3(b)(1), unless the authorization is terminated or revoked.  Performed at [facility], [place] [address], [place], [place] [address]   MRSA Next Gen by PCR, Nasal     Status: Abnormal   Collection Time: 04/29/22 12:19 AM   Specimen: Anterior Nasal Swab  Result Value Ref Range Status   MRSA by PCR Next Gen DETECTED (A) NOT DETECTED Final    Comment: (NOTE) The GeneXpert MRSA Assay (FDA approved for NASAL specimens only), is one component of a comprehensive MRSA colonization surveillance program. It is not intended to diagnose MRSA infection nor to guide or monitor treatment for MRSA infections. Test performance is not FDA approved in patients less than 61 years old. Performed at [facility], [place] [address] [place], [place] [address]          Radiology Studies: DG Ankle Complete Left  Result Date: 04/28/2022 CLINICAL DATA:  Wound. EXAM: LEFT ANKLE COMPLETE - 3+ VIEW COMPARISON:  Tibia/fibular radiographs 06/24/2021 FINDINGS: No erosion, periosteal reaction, or bone destruction. Normal ankle alignment. Preserved ankle mortise. No fracture. Small plantar calcaneal spur and Achilles tendon enthesophyte. Generalized soft tissue edema. No radiopaque foreign body or soft tissue gas. IMPRESSION: 1. Generalized soft tissue edema. No radiographic findings of osteomyelitis. 2. Small plantar calcaneal spur and Achilles tendon enthesophyte. Electronically Signed    By: Keith Rake M.D.   On: 04/28/2022 19:39   DG Foot Complete Left  Result Date: 04/28/2022 CLINICAL DATA:  rule out osteo.  Pre-existing left foot wound. EXAM: LEFT FOOT - COMPLETE 3+ VIEW COMPARISON:  None Available. FINDINGS: No cortical erosion or destruction. There is no evidence of fracture or dislocation. Degenerative changes of the distal interphalangeal joints. Diffuse mild subcutaneus soft tissue edema. IMPRESSION: 1. No radiographic findings suggest osteomyelitis. If high clinical suspicion, please consider MRI (with intravenous contrast if GFR greater than 30). 2.  No acute displaced fracture or dislocation. Electronically Signed   By: Iven Finn M.D.   On: 04/28/2022 19:39   VAS [place] LOWER EXTREMITY ARTERIAL DUPLEX  Result Date: 04/30/2022 LOWER EXTREMITY ARTERIAL DUPLEX STUDY Patient Name:  JABREIA Suastegui  Date of Exam:   04/30/2022 Medical Rec #: [id number]        Accession #:    [id number] Date of Birth: 06-18-1941        Patient Gender: F Patient Age:   47 years Exam [facility]  [facility] Procedure:  VAS [place] LOWER EXTREMITY ARTERIAL DUPLEX Referring Phys: Servando Snare --------------------------------------------------------------------------------  Indications: Ulceration, gangrene, and peripheral artery disease. High Risk Factors: Hypertension, past history of smoking.  Current ABI: N/A- patient unable to tolerate Limitations: Patient pain and bandaging Performing Technologist: Darlin Coco RDMS, RVT  Examination Guidelines: A complete evaluation includes B-mode imaging, spectral Doppler, color Doppler, and power Doppler as needed of all accessible portions of each vessel. Bilateral testing is considered an integral part of a complete examination. Limited examinations for reoccurring indications may be performed as noted.   +----------+--------+-----+---------------+-----------------+------------------+ LEFT      PSV cm/sRatioStenosis       Waveform         Comments            +----------+--------+-----+---------------+-----------------+------------------+ EIA Distal120                                                             +----------+--------+-----+---------------+-----------------+------------------+ CFA Prox  85                          audibly triphasic                   +----------+--------+-----+---------------+-----------------+------------------+ CFA Mid   160                         triphasic                           +----------+--------+-----+---------------+-----------------+------------------+ CFA Distal117                         triphasic                           +----------+--------+-----+---------------+-----------------+------------------+ DFA       94                          triphasic                           +----------+--------+-----+---------------+-----------------+------------------+ SFA Prox  113                         triphasic                           +----------+--------+-----+---------------+-----------------+------------------+ SFA Mid   163                         triphasic                           +----------+--------+-----+---------------+-----------------+------------------+ SFA Distal135                         audibly triphasic                   +----------+--------+-----+---------------+-----------------+------------------+ POP Prox  109  audibly triphasic                   +----------+--------+-----+---------------+-----------------+------------------+ POP Mid   113                         triphasic                           +----------+--------+-----+---------------+-----------------+------------------+ POP Distal73                                                              +----------+--------+-----+---------------+-----------------+------------------+ TP Trunk  189          50-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       Turbulent flow,                                                            color aliasing     +----------+--------+-----+---------------+-----------------+------------------+ PTA Prox  52                          monophasic                          +----------+--------+-----+---------------+-----------------+------------------+ PTA Mid   52                          monophasic                          +----------+--------+-----+---------------+-----------------+------------------+ PERO Mid  78                          monophasic                          +----------+--------+-----+---------------+-----------------+------------------+ DP                                                     Unable to insonate                                                        secondary to                                                              patient pain and  bandaging          +----------+--------+-----+---------------+-----------------+------------------+  Summary: Left: 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 Unable to insonate the dorsalis pedis artery. See technologist comments above.  See table(s) above for measurements and observations.    Preliminary         Scheduled Meds:  buPROPion  150 mg Oral Daily   Chlorhexidine Gluconate Cloth  6 each Topical Q0600   enoxaparin (LOVENOX) injection  30 mg Subcutaneous Q24H   feeding supplement  237 mL Oral Q24H   mupirocin ointment  1 application. Nasal BID   pantoprazole  40 mg Oral Daily   potassium chloride  40 mEq Oral TID   silver sulfADIAZINE   Topical Daily   sodium chloride flush  3 mL Intravenous Q12H   sodium hypochlorite   Topical Daily   Continuous Infusions:  sodium chloride 75 mL/hr at 04/29/22 2000   cefTRIAXone (ROCEPHIN)  IV 2 g (04/29/22 2343)   vancomycin       LOS: 0 days    Time  spent: 35 minutes    Barb Merino, MD [facility] Pager [phone]

## 2022-05-01 DIAGNOSIS — Z66 Do not resuscitate: Secondary | ICD-10-CM

## 2022-05-01 DIAGNOSIS — N1831 Chronic kidney disease, stage 3a: Secondary | ICD-10-CM | POA: Diagnosis not present

## 2022-05-01 DIAGNOSIS — N171 Acute kidney failure with acute cortical necrosis: Secondary | ICD-10-CM | POA: Diagnosis not present

## 2022-05-01 DIAGNOSIS — I739 Peripheral vascular disease, unspecified: Secondary | ICD-10-CM | POA: Diagnosis not present

## 2022-05-01 DIAGNOSIS — Z515 Encounter for palliative care: Secondary | ICD-10-CM

## 2022-05-01 DIAGNOSIS — F0390 Unspecified dementia without behavioral disturbance: Secondary | ICD-10-CM

## 2022-05-01 DIAGNOSIS — L03116 Cellulitis of left lower limb: Secondary | ICD-10-CM | POA: Diagnosis not present

## 2022-05-01 DIAGNOSIS — Z7189 Other specified counseling: Secondary | ICD-10-CM

## 2022-05-01 LAB — CBC WITH DIFFERENTIAL/PLATELET
Abs Immature Granulocytes: 0.04 10*3/uL (ref 0.00–0.07)
Basophils Absolute: 0.1 10*3/uL (ref 0.0–0.1)
Basophils Relative: 1 %
Eosinophils Absolute: 0.3 10*3/uL (ref 0.0–0.5)
Eosinophils Relative: 3 %
HCT: 35.7 % — ABNORMAL LOW (ref 36.0–46.0)
Hemoglobin: 12.2 g/dL (ref 12.0–15.0)
Immature Granulocytes: 1 %
Lymphocytes Relative: 17 %
Lymphs Abs: 1.4 10*3/uL (ref 0.7–4.0)
MCH: 33.3 pg (ref 26.0–34.0)
MCHC: 34.2 g/dL (ref 30.0–36.0)
MCV: 97.5 fL (ref 80.0–100.0)
Monocytes Absolute: 1.2 10*3/uL — ABNORMAL HIGH (ref 0.1–1.0)
Monocytes Relative: 15 %
Neutro Abs: 5.4 10*3/uL (ref 1.7–7.7)
Neutrophils Relative %: 63 %
Platelets: 313 10*3/uL (ref 150–400)
RBC: 3.66 MIL/uL — ABNORMAL LOW (ref 3.87–5.11)
RDW: 12.8 % (ref 11.5–15.5)
WBC: 8.4 10*3/uL (ref 4.0–10.5)
nRBC: 0 % (ref 0.0–0.2)

## 2022-05-01 LAB — MAGNESIUM: Magnesium: 1.8 mg/dL (ref 1.7–2.4)

## 2022-05-01 LAB — BASIC METABOLIC PANEL
Anion gap: 6 (ref 5–15)
BUN: 20 mg/dL (ref 8–23)
CO2: 24 mmol/L (ref 22–32)
Calcium: 8.7 mg/dL — ABNORMAL LOW (ref 8.9–10.3)
Chloride: 109 mmol/L (ref 98–111)
Creatinine, Ser: 0.89 mg/dL (ref 0.44–1.00)
GFR, Estimated: >60 mL/min (ref >60)
Glucose, Bld: 96 mg/dL (ref 70–99)
Potassium: 4 mmol/L (ref 3.5–5.1)
Sodium: 139 mmol/L (ref 135–145)

## 2022-05-01 MED ORDER — WHITE PETROLATUM EX OINT
TOPICAL_OINTMENT | CUTANEOUS | Status: DC | PRN
  Filled 2022-05-01: qty 28.35

## 2022-05-01 NOTE — Progress Notes (Signed)
Patient ID: [name], female   DOB: 1941-08-26, 81 y.o.   MRN: [id number] I spoke with the patient's stepdaughter.  Patient's husband currently has metastatic cancer and is under treatment at [facility].  The stepdaughter and patient's husband have discussed treatment options and they would like to proceed with comfort care and not pursue the amputation.  I will plan on canceling surgery tomorrow and recommend getting hospice involved for comfort care.

## 2022-05-01 NOTE — Plan of Care (Signed)
  Problem: Health Behavior/Discharge Planning: Goal: Ability to manage health-related needs will improve Outcome: Progressing   

## 2022-05-01 NOTE — Progress Notes (Signed)
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  In the emergency room she was found with acute kidney injury and infected left leg.   Assessment & Plan:   Cellulitis of left lower extremity, underlying chronic ischemic wound and skin necrosis: Blood cultures negative so far. MRSA swab positive. Currently remains on ceftriaxone and vancomycin. Chronic ischemic changes with poor vasculature, very painful leg and nonhealing ischemic ulcer.  Nonsalvageable leg. Follow-up with vascular orthopedics.  Will need left above-knee amputation.  Continue antibiotics until surgical procedure.  Acute kidney injury on CKD stage IIIa: Known baseline creatinine 0.9-1.2.  Treated with IV fluids.  Significantly improved and normalizing.  Continue to monitor.  Hypokalemia: Adequately improved.  Essential hypertension: Holding losartan due to AKI.  Blood pressure stable.  Will resume after surgery.  Dementia: With mild behavioral changes.  Fall precautions.  Delirium precautions.  Continue on bupropion.  Goal of care discussion: DNR as recorded by patient herself. Patient with extreme debility, dementia.  Currently in need for left above-knee amputation to avoid further spread of infection. Husband is primary Management consultant, he is admitted at another hospital with TIA and designates his daughter Ms. Misty Stanley as Education officer, environmental. Pending surgical procedure.   DVT prophylaxis: enoxaparin (LOVENOX) injection 40 mg Start: 05/01/22 1000   Code Status: DNR Family Communication: None. Disposition Plan: Status is: Inpatient Remains inpatient appropriate  because: Significant leg infection, anticipate inpatient surgical procedure.     Consultants:  Orthopedics Vascular surgery  Procedures:  None  Antimicrobials:  Vancomycin Rocephin 6/5---   Subjective:  Patient seen and examined.  Pleasantly confused.  Denies any complaints.  She tells me that she is agreeable for leg amputation if explained to her nicely.  I tried to explain to her about why she needs leg amputation and she was convinced.  Objective: Vitals:   04/30/22 1700 04/30/22 2140 05/01/22 0544 05/01/22 0925  BP: 113/70 (!) 120/58 139/87 135/84  Pulse: 100 (!) 102 94 89  Resp: 18 18 18 18   Temp: 98.3 F (36.8 C) 98 F (36.7 C) 98 F (36.7 C) 98.2 F (36.8 C)  TempSrc: Oral Oral    SpO2: 98% 95% 95% 98%  Weight:   87.5 kg   Height:        Intake/Output Summary (Last 24 hours) at 05/01/2022 1102 Last data filed at 05/01/2022 0242 Gross per 24 hour  Intake 1424.98 ml  Output 500 ml  Net 924.98 ml   Filed Weights   04/28/22 1901 04/30/22 0500 05/01/22 0544  Weight: 84.4 kg 87.7 kg 87.5 kg    Examination:  General exam: Appears calm and comfortable at rest.  Pleasant and confused. Respiratory system: Clear to auscultation. Respiratory effort normal.  No added sounds. Cardiovascular system: S1 & S2 heard, RRR. No JVD, murmurs, rubs, gallops or clicks.  Gastrointestinal system: Abdomen is nondistended, soft and nontender. No organomegaly or masses felt. Normal bowel sounds heard. Central nervous system: Alert and awake.  Oriented x2-3.  She is mostly aware about the situation.  Moves all extremities. Extremities: Symmetric 5 x 5 power. Skin:  Left lower extremity with redness, multiple necrotic areas, no palpable pedal pulses.  Pictures in  the chart.    Data Reviewed: I have personally reviewed following labs and imaging studies  CBC: Recent Labs  Lab 04/28/22 2005 04/29/22 0500 04/30/22 0439 05/01/22 0319  WBC 9.6 8.7 11.1* 8.4  NEUTROABS  --   --    --  5.4  HGB 14.6 12.5 11.8* 12.2  HCT 43.1 36.5 34.0* 35.7*  MCV 96.0 97.3 96.3 97.5  PLT 389 295 292 [id number]   Basic Metabolic Panel: Recent Labs  Lab 04/28/22 2005 04/29/22 0500 04/30/22 0439 05/01/22 0319  NA 139 136 139 139  K 3.0* 3.1* 2.8* 4.0  CL 97* 103 105 109  CO2 25 21* 21* 24  GLUCOSE 107* 93 110* 96  BUN 77* 63* 36* 20  CREATININE 2.79* 1.97* 1.21* 0.89  CALCIUM 9.8 8.4* 8.5* 8.7*  MG  --  2.2  --  1.8  PHOS  --   --  2.7  --    GFR: Estimated Creatinine Clearance: 53.1 mL/min (by C-G formula based on SCr of 0.89 mg/dL). Liver Function Tests: Recent Labs  Lab 04/29/22 0500  AST 20  ALT 13  ALKPHOS 63  BILITOT 0.8  PROT 6.4*  ALBUMIN 2.9*   No results for input(s): "LIPASE", "AMYLASE" in the last 168 hours. No results for input(s): "AMMONIA" in the last 168 hours. Coagulation Profile: No results for input(s): "INR", "PROTIME" in the last 168 hours. Cardiac Enzymes: No results for input(s): "CKTOTAL", "CKMB", "CKMBINDEX", "TROPONINI" in the last 168 hours. BNP (last 3 results) No results for input(s): "PROBNP" in the last 8760 hours. HbA1C: No results for input(s): "HGBA1C" in the last 72 hours. CBG: Recent Labs  Lab 04/29/22 1838  GLUCAP 114*   Lipid Profile: No results for input(s): "CHOL", "HDL", "LDLCALC", "TRIG", "CHOLHDL", "LDLDIRECT" in the last 72 hours. Thyroid Function Tests: No results for input(s): "TSH", "T4TOTAL", "FREET4", "T3FREE", "THYROIDAB" in the last 72 hours. Anemia Panel: No results for input(s): "VITAMINB12", "FOLATE", "FERRITIN", "TIBC", "IRON", "RETICCTPCT" in the last 72 hours. Sepsis Labs: Recent Labs  Lab 04/28/22 2005 04/28/22 2052  LATICACIDVEN 1.5 2.2*    Recent Results (from the past 240 hour(s))  Resp Panel by RT-PCR (Flu A&B, Covid) Anterior Nasal Swab     Status: None   Collection Time: 04/29/22 12:19 AM   Specimen: Anterior Nasal Swab  Result Value Ref Range Status   SARS Coronavirus 2 by RT PCR  NEGATIVE NEGATIVE Final    Comment: (NOTE) SARS-CoV-2 target nucleic acids are NOT DETECTED.  The SARS-CoV-2 RNA is generally detectable in upper respiratory specimens during the acute phase of infection. The lowest concentration of SARS-CoV-2 viral copies this assay can detect is 138 copies/mL. A negative result does not preclude SARS-Cov-2 infection and should not be used as the sole basis for treatment or other patient management decisions. A negative result may occur with  improper specimen collection/handling, submission of specimen other than nasopharyngeal swab, presence of viral mutation(s) within the areas targeted by this assay, and inadequate number of viral copies(<138 copies/mL). A negative result must be combined with clinical observations, patient history, and epidemiological information. The expected result is Negative.  Fact Sheet for Patients:  EntrepreneurPulse.com.au  Fact Sheet for Healthcare Providers:  IncredibleEmployment.be  This test is no t yet approved or cleared by the [place] FDA and  has been authorized for detection and/or diagnosis of SARS-CoV-2 by FDA under an Emergency Use Authorization (EUA). This EUA will remain  in effect (meaning this test can be used) for the  duration of the COVID-19 declaration under Section 564(b)(1) of the Act, 21 U.S.C.section 360bbb-3(b)(1), unless the authorization is terminated  or revoked sooner.       Influenza A by PCR NEGATIVE NEGATIVE Final   Influenza B by PCR NEGATIVE NEGATIVE Final    Comment: (NOTE) The Xpert Xpress SARS-CoV-2/FLU/RSV plus assay is intended as an aid in the diagnosis of influenza from Nasopharyngeal swab specimens and should not be used as a sole basis for treatment. Nasal washings and aspirates are unacceptable for Xpert Xpress SARS-CoV-2/FLU/RSV testing.  Fact Sheet for Patients: EntrepreneurPulse.com.au  Fact Sheet for  Healthcare Providers: IncredibleEmployment.be  This test is not yet approved or cleared by the [place] FDA and has been authorized for detection and/or diagnosis of SARS-CoV-2 by FDA under an Emergency Use Authorization (EUA). This EUA will remain in effect (meaning this test can be used) for the duration of the COVID-19 declaration under Section 564(b)(1) of the Act, 21 U.S.C. section 360bbb-3(b)(1), unless the authorization is terminated or revoked.  Performed at [facility], [place] [address] [place], [place] [address]   MRSA Next Gen by PCR, Nasal     Status: Abnormal   Collection Time: 04/29/22 12:19 AM   Specimen: Anterior Nasal Swab  Result Value Ref Range Status   MRSA by PCR Next Gen DETECTED (A) NOT DETECTED Final    Comment: (NOTE) The GeneXpert MRSA Assay (FDA approved for NASAL specimens only), is one component of a comprehensive MRSA colonization surveillance program. It is not intended to diagnose MRSA infection nor to guide or monitor treatment for MRSA infections. Test performance is not FDA approved in patients less than 39 years old. Performed at [facility], [place] [address], [place], [place] [address]          Radiology Studies: VAS [place] LOWER EXTREMITY ARTERIAL DUPLEX  Result Date: 04/30/2022 LOWER EXTREMITY ARTERIAL DUPLEX STUDY Patient Name:  [name]  Date of Exam:   04/30/2022 Medical Rec #: [id number]        Accession #:    [id number] Date of Birth: Nov 11, 1941        Patient Gender: F Patient Age:   58 years Exam Location:  [facility] Procedure:      VAS [place] LOWER EXTREMITY ARTERIAL DUPLEX Referring Phys: Servando Snare --------------------------------------------------------------------------------  Indications: Ulceration, gangrene, and peripheral artery disease. High Risk Factors: Hypertension, past history of smoking.  Current ABI: N/A- patient unable to tolerate Limitations: Patient  pain and bandaging Performing Technologist: Darlin Coco RDMS, RVT  Examination Guidelines: A complete evaluation includes B-mode imaging, spectral Doppler, color Doppler, and power Doppler as needed of all accessible portions of each vessel. Bilateral testing is considered an integral part of a complete examination. Limited examinations for reoccurring indications may be performed as noted.   +----------+--------+-----+---------------+-----------------+------------------+ LEFT      PSV cm/sRatioStenosis       Waveform         Comments           +----------+--------+-----+---------------+-----------------+------------------+ EIA Distal120                                                             +----------+--------+-----+---------------+-----------------+------------------+ CFA Prox  85  audibly triphasic                   +----------+--------+-----+---------------+-----------------+------------------+ CFA Mid   160                         triphasic                           +----------+--------+-----+---------------+-----------------+------------------+ CFA Distal117                         triphasic                           +----------+--------+-----+---------------+-----------------+------------------+ DFA       94                          triphasic                           +----------+--------+-----+---------------+-----------------+------------------+ SFA Prox  113                         triphasic                           +----------+--------+-----+---------------+-----------------+------------------+ SFA Mid   163                         triphasic                           +----------+--------+-----+---------------+-----------------+------------------+ SFA Distal135                         audibly triphasic                   +----------+--------+-----+---------------+-----------------+------------------+ POP Prox   109                         audibly triphasic                   +----------+--------+-----+---------------+-----------------+------------------+ POP Mid   113                         triphasic                           +----------+--------+-----+---------------+-----------------+------------------+ POP Distal73                                                              +----------+--------+-----+---------------+-----------------+------------------+ TP Trunk  189          50-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       Turbulent flow,  color aliasing     +----------+--------+-----+---------------+-----------------+------------------+ PTA Prox  52                          monophasic                          +----------+--------+-----+---------------+-----------------+------------------+ PTA Mid   52                          monophasic                          +----------+--------+-----+---------------+-----------------+------------------+ PERO Mid  78                          monophasic                          +----------+--------+-----+---------------+-----------------+------------------+ DP                                                     Unable to insonate                                                        secondary to                                                              patient pain and                                                          bandaging          +----------+--------+-----+---------------+-----------------+------------------+  Summary: Left: 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 Unable to insonate the dorsalis pedis artery. See technologist comments above.  See table(s) above for measurements and observations. Electronically signed by Harold Barban MD on 04/30/2022 at 10:50:28 PM.    Final          Scheduled Meds:  buPROPion  150 mg Oral Daily   Chlorhexidine Gluconate Cloth  6 each Topical Q0600   enoxaparin (LOVENOX) injection  40 mg Subcutaneous Q24H   feeding supplement  237 mL Oral Q24H   mupirocin ointment  1 application. Nasal BID   pantoprazole  40 mg Oral Daily   potassium chloride  40 mEq Oral TID   silver sulfADIAZINE   Topical Daily   sodium chloride flush  3 mL Intravenous Q12H   sodium hypochlorite   Topical Daily   Continuous Infusions:  sodium chloride 75 mL/hr at 05/01/22 0004   cefTRIAXone (ROCEPHIN)  IV 2 g (04/30/22 2204)   vancomycin 1,000 mg (04/30/22 1326)  LOS: 1 day    Time spent: 35 minutes    Barb Merino, MD [facility] Pager [phone]

## 2022-05-01 NOTE — Progress Notes (Signed)
Nutrition Brief Note  Pt identified on MST list. Pt admitted for AKI and infected L leg. Chart reviewed, per ortho she would like to proceed with comfort care, hospice recommended. Palliative care consulted.  Pt on heart healthy diet.  Diet changed to Regular. No further nutrition interventions planned at this time.  Please re-consult as needed.   Lockie Pares., RD, LDN, CNSC See AMiON for contact information

## 2022-05-02 ENCOUNTER — Encounter (HOSPITAL_COMMUNITY)
Admission: EM | Disposition: A | Discharge status: Hospice | Payer: Self-pay | Source: Home / Self Care | Attending: Internal Medicine

## 2022-05-02 DIAGNOSIS — N171 Acute kidney failure with acute cortical necrosis: Secondary | ICD-10-CM | POA: Diagnosis not present

## 2022-05-02 DIAGNOSIS — L03116 Cellulitis of left lower limb: Secondary | ICD-10-CM | POA: Diagnosis not present

## 2022-05-02 DIAGNOSIS — Z515 Encounter for palliative care: Secondary | ICD-10-CM | POA: Diagnosis not present

## 2022-05-02 DIAGNOSIS — I739 Peripheral vascular disease, unspecified: Secondary | ICD-10-CM | POA: Diagnosis not present

## 2022-05-02 DIAGNOSIS — N1831 Chronic kidney disease, stage 3a: Secondary | ICD-10-CM | POA: Diagnosis not present

## 2022-05-02 SURGERY — AMPUTATION, ABOVE KNEE
Anesthesia: Choice | Site: Knee | Laterality: Left

## 2022-05-02 MED ORDER — GLYCOPYRROLATE 1 MG PO TABS
1.0000 mg | ORAL_TABLET | ORAL | Status: DC | PRN
  Filled 2022-05-02: qty 1

## 2022-05-02 MED ORDER — ONDANSETRON 4 MG PO TBDP: 4.0000 mg | ORAL_TABLET | Freq: Four times a day (QID) | ORAL | Status: DC | PRN

## 2022-05-02 MED ORDER — HYDROCODONE-ACETAMINOPHEN 7.5-325 MG PO TABS
1.0000 | ORAL_TABLET | Freq: Four times a day (QID) | ORAL | Status: DC | PRN
  Administered 2022-05-02 – 2022-05-06 (×5): 1 via ORAL
  Filled 2022-05-02 (×5): qty 1

## 2022-05-02 MED ORDER — BIOTENE DRY MOUTH MT LIQD: 15.0000 mL | OROMUCOSAL | Status: DC | PRN

## 2022-05-02 MED ORDER — HALOPERIDOL LACTATE 2 MG/ML PO CONC
0.5000 mg | ORAL | Status: DC | PRN
  Filled 2022-05-02: qty 5

## 2022-05-02 MED ORDER — MORPHINE SULFATE (PF) 2 MG/ML IV SOLN
1.0000 mg | INTRAVENOUS | Status: DC
  Administered 2022-05-02 – 2022-05-04 (×13): 2 mg via INTRAVENOUS
  Filled 2022-05-02 (×13): qty 1

## 2022-05-02 MED ORDER — MORPHINE SULFATE (PF) 2 MG/ML IV SOLN: 1.0000 mg | INTRAVENOUS | Status: DC | PRN

## 2022-05-02 MED ORDER — HALOPERIDOL 0.5 MG PO TABS
0.5000 mg | ORAL_TABLET | ORAL | Status: DC | PRN
  Filled 2022-05-02 (×2): qty 1

## 2022-05-02 MED ORDER — LORAZEPAM 1 MG PO TABS: 1.0000 mg | ORAL_TABLET | ORAL | Status: DC | PRN

## 2022-05-02 MED ORDER — HALOPERIDOL LACTATE 5 MG/ML IJ SOLN: 0.5000 mg | INTRAMUSCULAR | Status: DC | PRN

## 2022-05-02 MED ORDER — LORAZEPAM 2 MG/ML PO CONC
1.0000 mg | ORAL | Status: DC | PRN
  Filled 2022-05-02: qty 0.5

## 2022-05-02 MED ORDER — GLYCOPYRROLATE 0.2 MG/ML IJ SOLN: 0.2000 mg | INTRAMUSCULAR | Status: DC | PRN

## 2022-05-02 MED ORDER — POLYVINYL ALCOHOL 1.4 % OP SOLN
1.0000 [drp] | Freq: Four times a day (QID) | OPHTHALMIC | Status: DC | PRN
  Filled 2022-05-02: qty 15

## 2022-05-02 MED ORDER — LORAZEPAM 2 MG/ML IJ SOLN: 1.0000 mg | INTRAMUSCULAR | Status: DC | PRN

## 2022-05-02 MED ORDER — MORPHINE SULFATE (PF) 2 MG/ML IV SOLN
1.0000 mg | INTRAVENOUS | Status: DC | PRN
  Administered 2022-05-03 – 2022-05-04 (×2): 2 mg via INTRAVENOUS
  Filled 2022-05-02 (×2): qty 1

## 2022-05-02 MED ORDER — ONDANSETRON HCL 4 MG/2ML IJ SOLN: 4.0000 mg | Freq: Four times a day (QID) | INTRAMUSCULAR | Status: DC | PRN

## 2022-05-02 NOTE — Consult Note (Signed)
Palliative Care Consult Note                                  Date: 05/02/2022   Patient Name: [name]  DOB: September 08, 1941  MRN: [id number]  Age / Sex: 81 y.o., female  PCP: Gwenlyn Found, MD Referring Physician: Dorcas Carrow, MD  Reason for Consultation: Establishing goals of care  HPI/Patient Profile: 81 y.o. female  with past medical history of dementia, CKD stage IIIa, asthma, hypertension, GERD, depression, chronic low back pain, and chronic left lower extremity ulceration.  She presented to [facility] emergency department on 04/28/2022 with worsening lower extremity wounds and necrosis.  In the ED, she was found to have fever, acute kidney injury, and infected left leg.  Admitted to hospitalist service.  Past Medical History:  Diagnosis Date   Arthritis    Asthma    Chronic renal impairment, stage 3 (moderate) (HCC)    Depression    GERD (gastroesophageal reflux disease)    Hearing loss    Hypertension    Neck pain    Numbness    Restless legs    Sciatica     Subjective:   I have reviewed medical records including EPIC notes, labs and imaging.  Patient has been evaluated by ortho, and recommended to have left AKA.  I spoke with patient's step-daughter [name] by phone to discuss diagnosis, prognosis, GOC, EOL wishes, disposition, and options.  I introduced Palliative Medicine as specialized medical care for people living with serious illness. It focuses on providing relief from the symptoms and stress of a serious illness.   We discussed patient's current illness and what it means in the larger context of his/her ongoing co-morbidities. Current clinical status was reviewed. Natural disease trajectory of chronic illness was discussed.  Created space and opportunity for patient and family to explore thoughts and feelings regarding current medical situation. Values and goals of care important to patient and family were  attempted to be elicited.  Questions and concerns addressed. Family encouraged to call with questions or concerns.    Life Review: Ms. [name] is originally from [place].  She and her husband [name] both worked for [employer].  She was a Engineer, mining.  They relocated to [facility] [place] [facility] approximately 30 years ago when [place] moved to [place].  She does not have biological children of her own, but was a stepmother to [place].  Functional Status: Prior to admission, Mrs. [name] was living at home with her husband.  She was ambulatory up until around May 15.  Since then she was mostly wheelchair-bound, but could stand to pivot and transfer.  She required assistance for all ADLs, and her husband was primary caregiver.  Goals: To be determined  Discussion: Prior to assessing the patient at bedside, I called Misty Stanley to find out her situation and try to schedule a meeting time. Misty Stanley tells me she is in [place] (she lives there). 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   Misty Stanley reports that [name] has been having issues with her left lower leg for the past 18 months.  It started with lymphedema and weeping blisters, and eventually progressed to ulceration.  She was hospitalized at [facility] 06/24/2021 through 07/02/2021 for cellulitis of left lower extremity.  She was discharged to SNF and required IV antibiotics for 8 weeks.  [name] states this was a terrible experience for her stepmother.  On May 5, [name] reports the wounds were fully healed.  However, it was recommended to continue maintenance care at home with daily washing and a topical cream.  Unfortunately, it was during this time that patient's husband became ill himself and was no longer to care for his wife.  Subsequently, the wound recurred.  With her professional background, [name] fully understands [name]'s current medical situation.  Discussed that  [name] is not a good surgical candidate due to her many comorbidities.   Discussed that if she survived the surgery, she would most likely be bed or wheelchair bound and completely dependent for care living in a SNF long-term.  [name] understands that not proceeding with amputation would likely result in an end-of-life situation.  Patient's husband ([name]'s father) is currently hospitalized at [facility] with a new diagnosis of metastatic cancer.  [name] had a long discussion with him by phone earlier today.  They both feel that [name] should not proceed with like amputation.  They feel that the subsequent functional debility  would not be consistent with good quality of life for her.  They would want to transition to full comfort care.  After my phone discussion with [name], I went to see patient at bedside.  She reports 8 out of 10 pain in her left leg.  She is oriented for the most part, but her speech is very tangential.  She tells me she is having surgery tomorrow to remove her leg.  I let her know that there needs to be further discussion about plan of care and that I would arrange for a video chat with her and [name] at bedside tomorrow.  I discussed plan of care with Dr. Sloan Leiter.   Review of Systems  Musculoskeletal:        Left lower leg pain    Objective:   Primary Diagnoses: Present on Admission:  Chronic major depressive disorder  Chronic renal impairment, stage 3 (moderate) (HCC)  GERD (gastroesophageal reflux disease)  Chronic obstructive asthma (HCC)  Hypertension  Cellulitis   Physical Exam Vitals reviewed.  Constitutional:      General: She is not in acute distress.    Appearance: She is ill-appearing.  Pulmonary:     Effort: Pulmonary effort is normal.  Skin:    Findings: Wound present.     Comments: LLE wound wrapped in Kerlix  Neurological:     Mental Status: She is alert and oriented to person, place, and time.     Motor: Weakness present.   Psychiatric:        Speech: Speech is tangential.        Cognition and Memory: Cognition is impaired. Memory is impaired.     Vital Signs:  BP (!) 162/98 (BP Location: Left Arm)   Pulse (!) 105   Temp 99.4 F (37.4 C)   Resp 19   Ht 5\' 4"  (1.626 m)   Wt 86.9 kg   SpO2 96%   BMI 32.88 kg/m   Palliative Assessment/Data: PPS 30%     Assessment & Plan:   SUMMARY OF RECOMMENDATIONS   DNR/DNI as previously documented Continue all care for now Family does not want patient to proceed with amputation Plan for follow up discussion at bedside tomorrow morning with patient and family  Primary Decision Maker: Does not seem that patient has full insight into her current condition.  Due to her cognitive impairment, I do not  think she can make complex medical decisions on her own.  By default, her spouse would be her surrogate decision maker.  However, he is acutely ill and has a designated his daughter [name] to help make decisions.  Symptom Management:  Hydrocodone-acetaminophen 7.5-325 mg every 6 hours as needed for moderate pain Morphine 1-2 mg IV every 3 hours as needed for severe pain  Prognosis:  Poor overall  Discharge Planning:  To Be Determined    Thank you for allowing [place] to participate in the care of [place]  Signed by: Elie Confer, NP Palliative Medicine Team  Team Phone # [phone]  For individual providers, please see AMION

## 2022-05-02 NOTE — Progress Notes (Signed)
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  In the emergency room she was found with acute kidney injury and infected left leg.   Assessment & Plan:   Cellulitis of left lower extremity, underlying chronic ischemic wound and skin necrosis with gangrene. Acute kidney injury on CKD stage IIIa: Essential hypertension Dementia with behavioral disturbances Extreme debility and frailty  -Multiple discussions about plan of care, patient poor candidate even for above-knee amputation due to dementia, poor functional status, poor chances of recovery and rehab. -Decision maker patient's husband suffering from new diagnosis of metastatic cancer, extremely debilitated and unable to participate in care and he designated his daughter Ms. Lattie Haw for decision-making. -Given patient's poor recovery, decision was made to treat her with comfort level of care, not subjecting to surgery.  Adequate pain medications.  Symptom management.  Monitor symptoms over next 48 to 72 hours, if worsening symptoms will benefit with inpatient hospice placement. -Provide end-of-life care. -RN may pronounce death if happens in the hospital.   DVT prophylaxis:   Comfort care   Code Status: DNR, comfort care Family Communication: None.  Palliative care team coordinating care. Disposition Plan: Status is: Inpatient Remains inpatient appropriate because: End-of-life care.     Consultants:  Orthopedics Vascular surgery Palliative care  Procedures:  None  Antimicrobials:  Vancomycin Rocephin 6/5--- 6/9   Subjective:  Seen and examined.  Pleasantly  confused.  She is a scared that I am going to examine her dressing.  Remains afebrile.  Objective: Vitals:   05/01/22 2042 05/02/22 0500 05/02/22 0602 05/02/22 0908  BP: (!) 145/90  (!) 162/98 (!) 143/81  Pulse: 93  (!) 105 (!) 109  Resp: 19  19 18   Temp: 98.4 F (36.9 C)  99.4 F (37.4 C) 98.2 F (36.8 C)  TempSrc:      SpO2: 97%  96% 95%  Weight:  86.9 kg    Height:        Intake/Output Summary (Last 24 hours) at 05/02/2022 1340 Last data filed at 05/02/2022 1258 Gross per 24 hour  Intake 780 ml  Output 1010 ml  Net -230 ml    Filed Weights   04/30/22 0500 05/01/22 0544 05/02/22 0500  Weight: 87.7 kg 87.5 kg 86.9 kg    Examination:  General exam: Appears calm and comfortable at rest.  Hard of hearing.  Pleasantly confused.  Looks fairly comfortable at rest but anxious on examination. Respiratory system: Clear to auscultation. Respiratory effort normal.  No added sounds. Cardiovascular system: S1 & S2 heard, RRR. No JVD, murmurs, rubs, gallops or clicks.  Gastrointestinal system: Abdomen is nondistended, soft and nontender. No organomegaly or masses felt. Normal bowel sounds heard. Central nervous system: Alert and awake.  Oriented x 1-2.  S Extremities: Symmetric 5 x 5 power. Skin:  Left lower extremity with redness, multiple necrotic areas, no palpable pedal pulses.     Data Reviewed: I have personally reviewed following labs and imaging studies  CBC: Recent Labs  Lab 04/28/22 2005 04/29/22 0500 04/30/22 0439 05/01/22 0319  WBC 9.6 8.7 11.1* 8.4  NEUTROABS  --   --   --  5.4  HGB 14.6 12.5 11.8*  12.2  HCT 43.1 36.5 34.0* 35.7*  MCV 96.0 97.3 96.3 97.5  PLT 389 295 292 [id number]    Basic Metabolic Panel: Recent Labs  Lab 04/28/22 2005 04/29/22 0500 04/30/22 0439 05/01/22 0319  NA 139 136 139 139  K 3.0* 3.1* 2.8* 4.0  CL 97* 103 105 109  CO2 25 21* 21* 24  GLUCOSE 107* 93 110* 96  BUN 77* 63* 36* 20  CREATININE 2.79* 1.97* 1.21* 0.89  CALCIUM 9.8  8.4* 8.5* 8.7*  MG  --  2.2  --  1.8  PHOS  --   --  2.7  --     GFR: Estimated Creatinine Clearance: 52.9 mL/min (by C-G formula based on SCr of 0.89 mg/dL). Liver Function Tests: Recent Labs  Lab 04/29/22 0500  AST 20  ALT 13  ALKPHOS 63  BILITOT 0.8  PROT 6.4*  ALBUMIN 2.9*    No results for input(s): "LIPASE", "AMYLASE" in the last 168 hours. No results for input(s): "AMMONIA" in the last 168 hours. Coagulation Profile: No results for input(s): "INR", "PROTIME" in the last 168 hours. Cardiac Enzymes: No results for input(s): "CKTOTAL", "CKMB", "CKMBINDEX", "TROPONINI" in the last 168 hours. BNP (last 3 results) No results for input(s): "PROBNP" in the last 8760 hours. HbA1C: No results for input(s): "HGBA1C" in the last 72 hours. CBG: Recent Labs  Lab 04/29/22 1838  GLUCAP 114*    Lipid Profile: No results for input(s): "CHOL", "HDL", "LDLCALC", "TRIG", "CHOLHDL", "LDLDIRECT" in the last 72 hours. Thyroid Function Tests: No results for input(s): "TSH", "T4TOTAL", "FREET4", "T3FREE", "THYROIDAB" in the last 72 hours. Anemia Panel: No results for input(s): "VITAMINB12", "FOLATE", "FERRITIN", "TIBC", "IRON", "RETICCTPCT" in the last 72 hours. Sepsis Labs: Recent Labs  Lab 04/28/22 2005 04/28/22 2052  LATICACIDVEN 1.5 2.2*     Recent Results (from the past 240 hour(s))  Resp Panel by RT-PCR (Flu A&B, Covid) Anterior Nasal Swab     Status: None   Collection Time: 04/29/22 12:19 AM   Specimen: Anterior Nasal Swab  Result Value Ref Range Status   SARS Coronavirus 2 by RT PCR NEGATIVE NEGATIVE Final    Comment: (NOTE) SARS-CoV-2 target nucleic acids are NOT DETECTED.  The SARS-CoV-2 RNA is generally detectable in upper respiratory specimens during the acute phase of infection. The lowest concentration of SARS-CoV-2 viral copies this assay can detect is 138 copies/mL. A negative result does not preclude SARS-Cov-2 infection and should not be used as the sole  basis for treatment or other patient management decisions. A negative result may occur with  improper specimen collection/handling, submission of specimen other than nasopharyngeal swab, presence of viral mutation(s) within the areas targeted by this assay, and inadequate number of viral copies(<138 copies/mL). A negative result must be combined with clinical observations, patient history, and epidemiological information. The expected result is Negative.  Fact Sheet for Patients:  EntrepreneurPulse.com.au  Fact Sheet for Healthcare Providers:  IncredibleEmployment.be  This test is no t yet approved or cleared by the [place] FDA and  has been authorized for detection and/or diagnosis of SARS-CoV-2 by FDA under an Emergency Use Authorization (EUA). This EUA will remain  in effect (meaning this test can be used) for the duration of the COVID-19 declaration under Section 564(b)(1) of the Act, 21 U.S.C.section 360bbb-3(b)(1), unless the authorization is terminated  or revoked sooner.       Influenza A by PCR NEGATIVE NEGATIVE Final   Influenza B by PCR NEGATIVE NEGATIVE Final  Comment: (NOTE) The Xpert Xpress SARS-CoV-2/FLU/RSV plus assay is intended as an aid in the diagnosis of influenza from Nasopharyngeal swab specimens and should not be used as a sole basis for treatment. Nasal washings and aspirates are unacceptable for Xpert Xpress SARS-CoV-2/FLU/RSV testing.  Fact Sheet for Patients: EntrepreneurPulse.com.au  Fact Sheet for Healthcare Providers: IncredibleEmployment.be  This test is not yet approved or cleared by the [place] FDA and has been authorized for detection and/or diagnosis of SARS-CoV-2 by FDA under an Emergency Use Authorization (EUA). This EUA will remain in effect (meaning this test can be used) for the duration of the COVID-19 declaration under Section 564(b)(1) of the Act,  21 U.S.C. section 360bbb-3(b)(1), unless the authorization is terminated or revoked.  Performed at [facility], [place] [address] [place], [place] [address]   MRSA Next Gen by PCR, Nasal     Status: Abnormal   Collection Time: 04/29/22 12:19 AM   Specimen: Anterior Nasal Swab  Result Value Ref Range Status   MRSA by PCR Next Gen DETECTED (A) NOT DETECTED Final    Comment: (NOTE) The GeneXpert MRSA Assay (FDA approved for NASAL specimens only), is one component of a comprehensive MRSA colonization surveillance program. It is not intended to diagnose MRSA infection nor to guide or monitor treatment for MRSA infections. Test performance is not FDA approved in patients less than 66 years old. Performed at [facility], [place] [address] [place], [place] [address]          Radiology Studies: No results found.      Scheduled Meds:  buPROPion  150 mg Oral Daily   Chlorhexidine Gluconate Cloth  6 each Topical Q0600    morphine injection  1-2 mg Intravenous Q4H   mupirocin ointment  1 application  Nasal BID   pantoprazole  40 mg Oral Daily   silver sulfADIAZINE   Topical Daily   sodium chloride flush  3 mL Intravenous Q12H   sodium hypochlorite   Topical Daily   Continuous Infusions:     LOS: 2 days    Time spent: 35 minutes    Barb Merino, MD [facility] Pager [phone]

## 2022-05-02 NOTE — Progress Notes (Signed)
Palliative Medicine Progress Note   Patient Name: [name]       Date: 05/02/2022 DOB: [birth year]  Age: 81 y.o. MRN#: [id number] Attending Physician: Barb Merino, MD Primary Care Physician: Nickola Major, MD Admit Date: 04/28/2022    HPI/Patient Profile: 81 y.o. female  with past medical history of dementia, CKD stage IIIa, asthma, hypertension, GERD, depression, chronic low back pain, and chronic left lower extremity ulceration.  She presented to [facility] emergency department on 04/28/2022 with worsening lower extremity wounds and necrosis.  In the ED, she was found to have fever, acute kidney injury, and infected left leg.  Admitted to hospitalist service. Patient has been evaluated by Ortho, and recommended to have left AKA.  Subjective: Plan of care discussed with Dr. Sloan Leiter.  He spoke with patient earlier this morning and discussed that she is not a good surgical candidate.  I met today at bedside with patient and her stepdaughter [name] by video chat.  [name] reports 9/10 pain in her left leg.  I asked her if she recalled her discussion with Dr. Sloan Leiter this morning, and she is able to verbalize that surgery would be very risky and she may not survive.  [name] ask [name] if she agrees, and [name] states that she does not think surgery is a good idea.  Discussed that if she survive the surgery, she would be bed or wheelchair bound and completely dependent for care.  Also discussed that she would need to live in SNF long-term.  [name] agrees this would not be good quality of life for her.  Discussed transitioning her care to focus on comfort and pain relief; [name] is agreeable to this.  [name] also told [name] about her husband's cancer diagnosis, which she was  not previously aware of.  [name] states that she is planning to travel to [place] on Monday 6/12 and will visit both of them.  Outside the room, [name] and I further discuss the specifics of comfort care. Discussed continuing IV antibiotics and IV fluids, no labs, and discontinuing cardiac monitoring.  Discussed starting scheduled IV morphine to help with [name]'s symptom burden.  Discussed continuing current wound care -washing the area with Dakin solution, applying Silvadene, and covering with Kerlix.  [name] is fully agreeable and supportive of this plan of  care.   Objective:  Physical Exam Vitals reviewed.  Constitutional:      General: She is not in acute distress.    Appearance: She is ill-appearing.  Pulmonary:     Effort: Pulmonary effort is normal.  Skin:    Comments: LLE wound, wrapped in kerlix  Neurological:     Mental Status: She is alert.     Motor: Weakness present.  Psychiatric:        Mood and Affect: Mood is anxious.        Cognition and Memory: Cognition is impaired. Memory is impaired.             Vital Signs: BP (!) 143/81 (BP Location: Left Arm)   Pulse (!) 109   Temp 98.2 F (36.8 C)   Resp 18   Ht $R'5\' 4"'yU$  (1.626 m)   Wt 86.9 kg   SpO2 95%   BMI 32.88 kg/m  SpO2: SpO2: 95 % O2 Device: O2 Device: Room Air    LBM: Last BM Date : 04/28/22     Palliative Assessment/Data: PPS 30%     Palliative Medicine Assessment & Plan   Assessment: Principal Problem:   Cellulitis Active Problems:   Chronic major depressive disorder   Chronic renal impairment, stage 3 (moderate) (HCC)   GERD (gastroesophageal reflux disease)   Chronic obstructive asthma (HCC)   Hypertension   Acute renal failure superimposed on chronic kidney disease (HCC)   PVD (peripheral vascular disease) (HCC)   Gangrene of left foot (HCC)   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 (HCC)   End of life care    Recommendations/Plan: Full comfort measures  initiated DNR/DNI as previously documented Added orders for symptom management at EOL  Discontinue IV antibiotics and IV fluids PMT will continue to follow   Symptom Management:  Scheduled morphine 1 to 2 mg IV every 4 hours PRN morphine 1 to 2 mg IV every 2 hours as needed for breakthrough pain Lorazepam (ATIVAN) prn for anxiety Haloperidol (HALDOL) prn for agitation  Glycopyrrolate (ROBINUL) for excessive secretions Ondansetron (ZOFRAN) prn for nausea Polyvinyl alcohol (LIQUIFILM TEARS) prn for dry eyes Antiseptic oral rinse (BIOTENE) prn for dry mouth   Prognosis:  Unable to determine  Discharge Planning: To Be Determined   Thank you for allowing the Palliative Medicine Team to assist in the care of this patient.   MDM - High   Lavena Bullion, NP   Please contact Palliative Medicine Team phone at [phone] for questions and concerns.  For individual providers, please see AMION.

## 2022-05-03 DIAGNOSIS — L03116 Cellulitis of left lower limb: Secondary | ICD-10-CM | POA: Diagnosis not present

## 2022-05-03 DIAGNOSIS — N171 Acute kidney failure with acute cortical necrosis: Secondary | ICD-10-CM | POA: Diagnosis not present

## 2022-05-03 DIAGNOSIS — Z515 Encounter for palliative care: Secondary | ICD-10-CM | POA: Diagnosis not present

## 2022-05-03 DIAGNOSIS — N1831 Chronic kidney disease, stage 3a: Secondary | ICD-10-CM | POA: Diagnosis not present

## 2022-05-03 DIAGNOSIS — I739 Peripheral vascular disease, unspecified: Secondary | ICD-10-CM | POA: Diagnosis not present

## 2022-05-03 MED ORDER — HYDROCORTISONE 0.5 % EX CREA
TOPICAL_CREAM | Freq: Three times a day (TID) | CUTANEOUS | Status: DC
  Administered 2022-05-04 – 2022-05-05 (×2): 1 via TOPICAL
  Filled 2022-05-03: qty 28.35

## 2022-05-03 NOTE — Plan of Care (Signed)
  Problem: Pain Managment: Goal: General experience of comfort will improve Outcome: Progressing   

## 2022-05-03 NOTE — Progress Notes (Signed)
Palliative Medicine Progress Note   Patient Name: [name]       Date: 05/03/2022 DOB: 1941-11-20  Age: 81 y.o. MRN#: [id number] Attending Physician: Barb Merino, MD Primary Care Physician: Nickola Major, MD Admit Date: 04/28/2022  Reason for Follow-up: Symptom management, end-of-life care  HPI/Patient Profile: 81 y.o. female  with past medical history of dementia, CKD stage IIIa, asthma, hypertension, GERD, depression, chronic low back pain, and chronic left lower extremity ulceration.  She presented to [facility] emergency department on 04/28/2022 with worsening lower extremity wounds and necrosis.  In the ED, she was found to have fever, acute kidney injury, and infected left leg.  Admitted to hospitalist service. Patient has been evaluated by Ortho, and recommended to have left AKA.   Subjective: I spoke with Dr. Sloan Leiter, inquiring about possibility to transfer patient to [facility] where her husband is hospitalized.  He agrees and is going to initiate this process.  I went to bedside to see patient.  She appears to be resting comfortably, but awakens easily to voice.  She seems more confused today.  She tells me she is still hurting, but is not able to tell me what her pain rating is.  I told her we would be transferring her to [facility] so that she can see her husband, and she seems very appreciative of this..   I spoke with patient's stepdaughter Lattie Haw by phone and let her know that we will be transferring patient to [facility].   I recommended more aggressive pain management once patient has transferred and had an opportunity to visit with her husband.  Lattie Haw agrees with this plan of care.   Objective:  Physical Exam Vitals reviewed.  Constitutional:      General: She is not in  acute distress.    Appearance: She is ill-appearing.  Pulmonary:     Effort: Pulmonary effort is normal.  Skin:    Comments: LLE wound with drainage, wrapped in kerlix  Psychiatric:        Cognition and Memory: Cognition is impaired. Memory is impaired.             Vital Signs: BP 129/81   Pulse (!) 117   Temp 97.9 F (36.6 C) (Oral)   Resp 17   Ht 5\' 4"  (1.626 m)   Wt  86.9 kg   SpO2 93%   BMI 32.88 kg/m  SpO2: SpO2: 93 % O2 Device: O2 Device: Room Air O2 Flow Rate:     LBM: Last BM Date : 04/28/22     Palliative Assessment/Data: PPS 30%     Palliative Medicine Assessment & Plan   Assessment: Principal Problem:   Cellulitis Active Problems:   Chronic major depressive disorder   Chronic renal impairment, stage 3 (moderate) (HCC)   GERD (gastroesophageal reflux disease)   Chronic obstructive asthma (HCC)   Hypertension   Acute renal failure superimposed on chronic kidney disease (HCC)   PVD (peripheral vascular disease) (HCC)   Gangrene of left foot (HCC)   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 (HCC)   End of life care    Recommendations/Plan: Continue full comfort measures DNR/DNI as previously documented Pending transfer to [facility] scheduled morphine 1 to 2 mg IV every 4 hours  continue wound care PMT will continue to follow  Symptom Management:  morphine 1 to 2 mg IV every 2 hours as needed for breakthrough pain Lorazepam (ATIVAN) prn for anxiety Haloperidol (HALDOL) prn for agitation  Glycopyrrolate (ROBINUL) for excessive secretions Ondansetron (ZOFRAN) prn for nausea Polyvinyl alcohol (LIQUIFILM TEARS) prn for dry eyes Antiseptic oral rinse (BIOTENE) prn for dry mouth  Prognosis:  < 2 weeks  Discharge Planning: To Be Determined    Thank you for allowing the Palliative Medicine Team to assist in the care of this patient.   MDM - High   Lavena Bullion, NP   Please contact Palliative Medicine Team  phone at [phone] for questions and concerns.  For individual providers, please see AMION.

## 2022-05-03 NOTE — Progress Notes (Signed)
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  In the emergency room she was found with acute kidney injury and infected left leg.  Patient remained in the hospital due to inability to get surgery, converted to comfort care, unable to secure safe disposition as patient does not have any support system at home and inpatient hospice not available yet.   Assessment & Plan:   Cellulitis of left lower extremity, underlying chronic ischemic wound and skin necrosis with gangrene. Acute kidney injury on CKD stage IIIa: Essential hypertension Dementia with behavioral disturbances Extreme debility and frailty  -Multiple discussions about plan of care, patient poor candidate even for above-knee amputation due to dementia, poor functional status, poor chances of recovery and rehab. -Decision maker patient's husband suffering from new diagnosis of metastatic cancer, extremely debilitated and unable to participate in care and he designated his daughter Ms. Misty Stanley for decision-making. -Given patient's poor recovery, decision was made to treat her with comfort level of care, not subjecting to surgery.  Adequate pain medications.  Symptom management.  Pursuing inpatient hospice. -Provide end-of-life care. -RN may pronounce death if happens in the hospital. -Continue wound care as instructed.  Adequate pain management before dressing removal.  Patient's husband is admitted to [facility] long hospital with stage IV cancer that is new diagnosis.  Patient and her husband without any support system, will transfer patient to [facility] long hospital palliative care  unit so that this process can see each other as both approaching end-of-life.  Patient will ultimately anticipated to go to inpatient hospice.  Stable for transfer.   DVT prophylaxis:   Comfort care   Code Status: DNR, comfort care Family Communication: None.  Palliative care team coordinating care with husband and step daughter. Disposition Plan: Status is: Inpatient Remains inpatient appropriate because: End-of-life care.     Consultants:  Orthopedics Vascular surgery Palliative care  Procedures:  None  Antimicrobials:  Vancomycin Rocephin 6/5--- 6/9   Subjective: Seen and examined.  No overnight events.  Pleasantly confused.  Complains of inadequate pain control.  Objective: Vitals:   05/02/22 0602 05/02/22 0908 05/02/22 2057 05/03/22 0915  BP: (!) 162/98 (!) 143/81 (!) 139/96 129/81  Pulse: (!) 105 (!) 109 (!) 121 (!) 117  Resp: 19 18  17   Temp: 99.4 F (37.4 C) 98.2 F (36.8 C) 98.6 F (37 C) 97.9 F (36.6 C)  TempSrc:   Oral Oral  SpO2: 96% 95% 95% 93%  Weight:      Height:        Intake/Output Summary (Last 24 hours) at 05/03/2022 1257 Last data filed at 05/03/2022 0800 Gross per 24 hour  Intake 240 ml  Output 300 ml  Net -60 ml   Filed Weights   04/30/22 0500 05/01/22 0544 05/02/22 0500  Weight: 87.7 kg 87.5 kg 86.9 kg    Examination:  General exam: Appears calm and comfortable at rest.  Hard of hearing.  Pleasantly confused.  Looks fairly comfortable at rest but anxious on examination and attempted exam of the left leg is very painful. Respiratory system: Clear to auscultation. Respiratory effort normal.  No added sounds. Cardiovascular system: S1 & S2 heard, RRR. No  JVD, murmurs, rubs, gallops or clicks.  Gastrointestinal system: Abdomen is nondistended, soft and nontender. No organomegaly or masses felt. Normal bowel sounds heard. Central nervous system: Alert and awake.  Oriented x 1-2.   Extremities: Symmetric 5 x 5 power. Skin:  Left lower  extremity with redness, multiple necrotic areas and drainage, no palpable pedal pulses.     Data Reviewed: I have personally reviewed following labs and imaging studies  CBC: Recent Labs  Lab 04/28/22 2005 04/29/22 0500 04/30/22 0439 05/01/22 0319  WBC 9.6 8.7 11.1* 8.4  NEUTROABS  --   --   --  5.4  HGB 14.6 12.5 11.8* 12.2  HCT 43.1 36.5 34.0* 35.7*  MCV 96.0 97.3 96.3 97.5  PLT 389 295 292 [id number]   Basic Metabolic Panel: Recent Labs  Lab 04/28/22 2005 04/29/22 0500 04/30/22 0439 05/01/22 0319  NA 139 136 139 139  K 3.0* 3.1* 2.8* 4.0  CL 97* 103 105 109  CO2 25 21* 21* 24  GLUCOSE 107* 93 110* 96  BUN 77* 63* 36* 20  CREATININE 2.79* 1.97* 1.21* 0.89  CALCIUM 9.8 8.4* 8.5* 8.7*  MG  --  2.2  --  1.8  PHOS  --   --  2.7  --    GFR: Estimated Creatinine Clearance: 52.9 mL/min (by C-G formula based on SCr of 0.89 mg/dL). Liver Function Tests: Recent Labs  Lab 04/29/22 0500  AST 20  ALT 13  ALKPHOS 63  BILITOT 0.8  PROT 6.4*  ALBUMIN 2.9*   No results for input(s): "LIPASE", "AMYLASE" in the last 168 hours. No results for input(s): "AMMONIA" in the last 168 hours. Coagulation Profile: No results for input(s): "INR", "PROTIME" in the last 168 hours. Cardiac Enzymes: No results for input(s): "CKTOTAL", "CKMB", "CKMBINDEX", "TROPONINI" in the last 168 hours. BNP (last 3 results) No results for input(s): "PROBNP" in the last 8760 hours. HbA1C: No results for input(s): "HGBA1C" in the last 72 hours. CBG: Recent Labs  Lab 04/29/22 1838  GLUCAP 114*   Lipid Profile: No results for input(s): "CHOL", "HDL", "LDLCALC", "TRIG", "CHOLHDL", "LDLDIRECT" in the last 72 hours. Thyroid Function Tests: No results for input(s): "TSH", "T4TOTAL", "FREET4", "T3FREE", "THYROIDAB" in the last 72 hours. Anemia Panel: No results for input(s): "VITAMINB12", "FOLATE", "FERRITIN", "TIBC", "IRON", "RETICCTPCT" in the last 72 hours. Sepsis Labs: Recent Labs  Lab  04/28/22 2005 04/28/22 2052  LATICACIDVEN 1.5 2.2*    Recent Results (from the past 240 hour(s))  Resp Panel by RT-PCR (Flu A&B, Covid) Anterior Nasal Swab     Status: None   Collection Time: 04/29/22 12:19 AM   Specimen: Anterior Nasal Swab  Result Value Ref Range Status   SARS Coronavirus 2 by RT PCR NEGATIVE NEGATIVE Final    Comment: (NOTE) SARS-CoV-2 target nucleic acids are NOT DETECTED.  The SARS-CoV-2 RNA is generally detectable in upper respiratory specimens during the acute phase of infection. The lowest concentration of SARS-CoV-2 viral copies this assay can detect is 138 copies/mL. A negative result does not preclude SARS-Cov-2 infection and should not be used as the sole basis for treatment or other patient management decisions. A negative result may occur with  improper specimen collection/handling, submission of specimen other than nasopharyngeal swab, presence of viral mutation(s) within the areas targeted by this assay, and inadequate number of viral copies(<138 copies/mL). A negative result must be combined with clinical observations, patient history, and epidemiological information. The expected result is Negative.  Fact Sheet for Patients:  EntrepreneurPulse.com.au  Fact Sheet for Healthcare Providers:  IncredibleEmployment.be  This test is no t yet approved or cleared by the [place] FDA and  has been authorized for detection and/or diagnosis of SARS-CoV-2 by FDA under an Emergency Use Authorization (EUA). This EUA will remain  in effect (meaning this test can be used) for the duration of the COVID-19 declaration under Section 564(b)(1) of the Act, 21 U.S.C.section 360bbb-3(b)(1), unless the authorization is terminated  or revoked sooner.       Influenza A by PCR NEGATIVE NEGATIVE Final   Influenza B by PCR NEGATIVE NEGATIVE Final    Comment: (NOTE) The Xpert Xpress SARS-CoV-2/FLU/RSV plus assay is intended as  an aid in the diagnosis of influenza from Nasopharyngeal swab specimens and should not be used as a sole basis for treatment. Nasal washings and aspirates are unacceptable for Xpert Xpress SARS-CoV-2/FLU/RSV testing.  Fact Sheet for Patients: EntrepreneurPulse.com.au  Fact Sheet for Healthcare Providers: IncredibleEmployment.be  This test is not yet approved or cleared by the [place] FDA and has been authorized for detection and/or diagnosis of SARS-CoV-2 by FDA under an Emergency Use Authorization (EUA). This EUA will remain in effect (meaning this test can be used) for the duration of the COVID-19 declaration under Section 564(b)(1) of the Act, 21 U.S.C. section 360bbb-3(b)(1), unless the authorization is terminated or revoked.  Performed at [facility], [place] [address], [place], [place] [address]   MRSA Next Gen by PCR, Nasal     Status: Abnormal   Collection Time: 04/29/22 12:19 AM   Specimen: Anterior Nasal Swab  Result Value Ref Range Status   MRSA by PCR Next Gen DETECTED (A) NOT DETECTED Final    Comment: (NOTE) The GeneXpert MRSA Assay (FDA approved for NASAL specimens only), is one component of a comprehensive MRSA colonization surveillance program. It is not intended to diagnose MRSA infection nor to guide or monitor treatment for MRSA infections. Test performance is not FDA approved in patients less than 52 years old. Performed at [place], [place] [address], [place], [place] [address]          Radiology Studies: No results found.      Scheduled Meds:  buPROPion  150 mg Oral Daily   Chlorhexidine Gluconate Cloth  6 each Topical Q0600    morphine injection  1-2 mg Intravenous Q4H   mupirocin ointment  1 application  Nasal BID   pantoprazole  40 mg Oral Daily   silver sulfADIAZINE   Topical Daily   sodium chloride flush  3 mL Intravenous Q12H   sodium hypochlorite    Topical Daily   Continuous Infusions:     LOS: 3 days    Time spent: 35 minutes    Barb Merino, MD [facility] Pager [phone]

## 2022-05-04 DIAGNOSIS — L97923 Non-pressure chronic ulcer of unspecified part of left lower leg with necrosis of muscle: Secondary | ICD-10-CM | POA: Diagnosis not present

## 2022-05-04 MED ORDER — GABAPENTIN 100 MG PO CAPS
200.0000 mg | ORAL_CAPSULE | Freq: Three times a day (TID) | ORAL | Status: DC
  Administered 2022-05-04 – 2022-05-06 (×7): 200 mg via ORAL
  Filled 2022-05-04 (×7): qty 2

## 2022-05-04 MED ORDER — HYDROMORPHONE HCL 1 MG/ML IJ SOLN
0.5000 mg | INTRAMUSCULAR | Status: DC | PRN
  Filled 2022-05-04: qty 1

## 2022-05-04 MED ORDER — HYDROMORPHONE HCL 1 MG/ML IJ SOLN
1.0000 mg | Freq: Four times a day (QID) | INTRAMUSCULAR | Status: DC
  Administered 2022-05-04 – 2022-05-06 (×9): 1 mg via INTRAVENOUS
  Filled 2022-05-04 (×8): qty 1

## 2022-05-04 NOTE — Progress Notes (Signed)
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  In the emergency room she was found with acute kidney injury and gangrenous left leg.  Patient's dementia limits her from participating in medical decision making.   Was recommended that she undergo a left AKA.  After discussions with the surgeon, the patient's stepdaughter and husband opted for comfort care.  The patient's husband is admitted to [facility] with a new diagnosis of metastatic cancer.  On 6/10 she was transferred to [facility] to meet with her husband before being transitioned to hospice home. She is receiving morphine IV very frequently to control pain in her left leg.  She is also taking hydrocodone once a day.  She is asking for her gabapentin to be resumed as she has been taking it for many years. Subjective:  Asking for her Gabapentin. She is suspecting that the antifungal powder her husband was applying to her left foot may have something to do with him getting suddenly ill.  She has not yet gone down to see her husband.  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) Active Problems:   Cellulitis of left lower extremity   Paresthesia   Polyneuropathy   Chronic major depressive disorder   Chronic renal impairment, stage 3 (moderate) (HCC)   GERD (gastroesophageal reflux disease)   Chronic obstructive asthma (HCC)   Hypertension   Acute renal failure superimposed on chronic kidney disease (HCC)   PVD (peripheral vascular disease) ([place])   Gangrene of left foot ([place])   End  of life care    Objective:   Vitals:   05/02/22 2057 05/03/22 0915 05/03/22 2016 05/04/22 0517  BP: (!) 139/96 129/81 124/83 (!) 145/91  Pulse: (!) 121 (!) 117 (!) 106 (!) 107  Resp:  17 16 18   Temp: 98.6 F (37 C) 97.9 F (36.6 C) (!) 97.5 F (36.4 C) 97.6 F (36.4 C)  TempSrc: Oral Oral Oral Oral  SpO2: 95% 93% 92% 93%  Weight:      Height:       Filed Weights   04/30/22 0500 05/01/22 0544 05/02/22 0500  Weight: 87.7 kg 87.5 kg 86.9 kg   Exam: Awake alert oriented x3      Imaging and lab data was personally reviewed    CBC: Recent Labs  Lab 04/28/22 2005 04/29/22 0500 04/30/22 0439 05/01/22 0319  WBC 9.6 8.7 11.1* 8.4  NEUTROABS  --   --   --  5.4  HGB 14.6 12.5 11.8* 12.2  HCT 43.1 36.5 34.0* 35.7*  MCV 96.0 97.3 96.3 97.5  PLT 389 295 292 [id number]   Basic Metabolic Panel: Recent Labs  Lab 04/28/22 2005 04/29/22 0500 04/30/22 0439 05/01/22 0319  NA 139 136 139 139  K 3.0* 3.1* 2.8* 4.0  CL 97* 103 105 109  CO2 25 21* 21* 24  GLUCOSE 107* 93 110* 96  BUN 77* 63* 36* 20  CREATININE 2.79* 1.97* 1.21* 0.89  CALCIUM 9.8 8.4* 8.5* 8.7*  MG  --  2.2  --  1.8  PHOS  --   --  2.7  --    GFR: Estimated Creatinine Clearance: 52.9 mL/min (by C-G formula based on SCr of 0.89 mg/dL).  Scheduled Meds:  buPROPion  150 mg Oral Daily   gabapentin  200 mg Oral TID   hydrocortisone cream   Topical TID    morphine injection  1-2 mg Intravenous Q4H   mupirocin ointment  1 application  Nasal BID   pantoprazole  40 mg Oral Daily   silver sulfADIAZINE   Topical Daily   sodium chloride flush  3 mL Intravenous Q12H   sodium hypochlorite   Topical Daily   Continuous Infusions:   LOS: 4 days   Author: Debbe Odea  05/04/2022 1:00 PM

## 2022-05-04 NOTE — Progress Notes (Signed)
Palliative Medicine Progress Note   Patient Name: [name]       Date: 05/04/2022 DOB: 14-Nov-1941  Age: 81 y.o. MRN#: [id number] Attending Physician: Debbe Odea, MD Primary Care Physician: Nickola Major, MD Admit Date: 04/28/2022  Reason for Follow-up: Symptom management, end-of-life care  HPI/Patient Profile: 81 y.o. female  with past medical history of dementia, CKD stage IIIa, asthma, hypertension, GERD, depression, chronic low back pain, and chronic left lower extremity ulceration.  She presented to [facility] emergency department on 04/28/2022 with worsening lower extremity wounds and necrosis.  In the ED, she was found to have fever, acute kidney injury, and infected left leg.  Admitted to hospitalist service. Patient has been evaluated by Ortho, and recommended to have left AKA. After discussions with the surgeon, the patient's stepdaughter and husband opted for comfort care.  The patient's husband is admitted to [facility] with a new diagnosis of metastatic cancer.  On 6/10 she was transferred to [facility] to meet with her husband before being transitioned to hospice home. She is receiving morphine IV very frequently to control pain in her left leg.  She is also taking hydrocodone once a day  Subjective: Resting in bed, no distress, leg noted, pain medication needs noted, labs noted.   Objective:  Physical Exam Vitals reviewed.  Constitutional:      General: She is not in acute distress.    Appearance: She is ill-appearing.  Pulmonary:     Effort: Pulmonary effort is normal.  Skin:    Comments: LLE wound with drainage, wrapped in kerlix  Psychiatric:        Cognition and Memory: Cognition is impaired. Memory is impaired.       Pictures also noted on  chart.       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) Active Problems:   Paresthesia   Polyneuropathy   Cellulitis of left lower extremity   Chronic major depressive disorder   Chronic renal impairment, stage 3 (moderate) (HCC)   GERD (gastroesophageal reflux disease)   Chronic obstructive asthma (HCC)   Hypertension   Acute renal failure superimposed on chronic kidney disease (HCC)   PVD (peripheral vascular disease) (HCC)   Gangrene of left foot (HCC)   End of life care    Recommendations/Plan: Continue full comfort measures DNR/DNI as previously documented Opioid rotate from scheduled and PRN IV morphine to scheduled and PRN IV Dilaudid due to chronic renal impairment.  continue wound care PMT will continue to follow, monitor hospital course, consider residential hospice discussions on 05-05-22, daughter Lattie Haw likely coming into town tomorrow.   Symptom Management:  morphine 1 to 2 mg IV every 2 hours as needed for breakthrough pain Lorazepam (ATIVAN) prn for anxiety Haloperidol (HALDOL) prn for agitation  Glycopyrrolate (ROBINUL) for excessive secretions Ondansetron (ZOFRAN) prn for nausea Polyvinyl alcohol (LIQUIFILM TEARS) prn for dry eyes Antiseptic oral rinse (BIOTENE) prn for dry mouth  Prognosis:  < 2 weeks  Discharge Planning: To Be Determined    Thank you for allowing the Palliative Medicine Team to assist in the care of this patient.   MDM - moderate   Loistine Chance, MD   Please contact Palliative Medicine Team phone at [phone] for  questions and concerns.  For individual providers, please see AMION.

## 2022-05-05 DIAGNOSIS — R202 Paresthesia of skin: Secondary | ICD-10-CM

## 2022-05-05 DIAGNOSIS — E876 Hypokalemia: Secondary | ICD-10-CM

## 2022-05-05 DIAGNOSIS — G629 Polyneuropathy, unspecified: Secondary | ICD-10-CM

## 2022-05-05 DIAGNOSIS — F329 Major depressive disorder, single episode, unspecified: Secondary | ICD-10-CM

## 2022-05-05 DIAGNOSIS — H9190 Unspecified hearing loss, unspecified ear: Secondary | ICD-10-CM

## 2022-05-05 DIAGNOSIS — L97222 Non-pressure chronic ulcer of left calf with fat layer exposed: Secondary | ICD-10-CM

## 2022-05-05 DIAGNOSIS — I872 Venous insufficiency (chronic) (peripheral): Secondary | ICD-10-CM

## 2022-05-05 MED ORDER — HYDROMORPHONE HCL 1 MG/ML IJ SOLN: 0.5000 mg | INTRAMUSCULAR | 0 refills | Status: DC | PRN

## 2022-05-05 MED ORDER — HYDROMORPHONE HCL 1 MG/ML IJ SOLN: 1.0000 mg | Freq: Four times a day (QID) | INTRAMUSCULAR | 0 refills | Status: DC

## 2022-05-05 MED ORDER — DAKINS (1/4 STRENGTH) 0.125 % EX SOLN: Freq: Every day | CUTANEOUS | 0 refills | Status: DC

## 2022-05-05 MED ORDER — THEOPHYLLINE ER 400 MG PO TB24
400.0000 mg | ORAL_TABLET | Freq: Every day | ORAL | Status: DC
  Administered 2022-05-05 – 2022-05-06 (×2): 400 mg via ORAL
  Filled 2022-05-05 (×2): qty 1

## 2022-05-05 MED ORDER — ORAL CARE MOUTH RINSE
15.0000 mL | Freq: Two times a day (BID) | OROMUCOSAL | Status: DC
  Administered 2022-05-05 – 2022-05-06 (×3): 15 mL via OROMUCOSAL

## 2022-05-05 NOTE — TOC Progression Note (Signed)
Transition of Care [facility]) - Progression Note    Patient Details  Name: [name] MRN: [id number] Date of Birth: 03/27/1941  [facility] CM/SW Contact  [name], [name] Skiff, RN Phone Number: 05/05/2022, 3:00 PM  Clinical Narrative:    [facility] consult for residential hospice. Spoke with pt at bedside. She appears confused so contacted daughter [name] via phone. [name] is driving in from [place] and will be here this evening. [name] was offered choice for residential hospice and [facility]. [employer] liaison contacted for referral. No beds available today.       Readmission Risk Interventions     No data to display

## 2022-05-05 NOTE — Progress Notes (Signed)
Dsg change performed to LLE. Pt with c/o severe pain at 10/10. Medicated for pain.

## 2022-05-05 NOTE — Progress Notes (Signed)
Manufacturing engineer [facility]) Hospital Liaison Note  Referral received for patient/family interest in [facility]. Chart under review by [facility] physician.   Hospice eligibility pending.   Bedside evaluation will take place tomorrow morning at 10am.   Please call with any questions or concerns. Thank you  Roselee Nova, [facility] Liaison  [phone]

## 2022-05-05 NOTE — Care Management Important Message (Signed)
Important Message  Patient Details IM Letter placed in Patients room. Name: Anike Sheldon MRN: [id number] Date of Birth: [birth year]   Medicare Important Message Given:  Yes     Kerin Salen 05/05/2022, 9:53 AM

## 2022-05-05 NOTE — Discharge Summary (Addendum)
Physician Discharge Summary  [name] [id number] DOB: [birth year] DOA: 04/28/2022  PCP: Nickola Major, MD  Admit date: 04/28/2022 Discharge date: 05/06/2022 Discharging to: Hospice home Recommendations for Outpatient Follow-up:  Please note she has severe trouble with her hearing If she decides to continue dressing changes : Wash the LLE and foot with Dakin's solution by pouring it onto a washcloth, and gently rub the entire LLE and foot. Apply Silvadene to LLE and foot AFTER washing the areas with the Dakin's solution. Cover with kerlix. Saturate the kerlix with saline prior to removing to ease the removal.  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 ([place]) Active Problems:   Cellulitis of left lower extremity   Venous stasis ulcer of left calf with fat layer exposed without varicose veins (HCC)   Gangrene of left foot (HCC)   Paresthesia   Polyneuropathy   Chronic major depressive disorder   GERD (gastroesophageal reflux disease)   Hearing loss   Chronic obstructive asthma (HCC)   Hypertension   AKI (acute kidney injury) ([place])   PVD (peripheral vascular disease) (HCC)   End of life care   Hypokalemia     Hospital Course: 81 year old with history of dementia, low back pain, depression, CKD stage IIIa, GERD, asthma, hypertension and chronic left lower extremity cellulitis with ulceration presented to the hospital with worsening lower extremity wounds and necrosis, fever and acute kidney injury.  In the emergency room she was found with acute kidney injury and gangrenous left leg & she had cellulitis around her ulcers.    Creatinine was 2.79.  Was recommended that she undergo a left AKA.  After discussions with the surgeon, the patient's stepdaughter and her husband opted for comfort care.  The patient's husband is admitted to [facility]  with a new diagnosis of metastatic cancer.  On 6/82 she was transferred to [facility] to meet with her husband before being transitioned to hospice home. She was receiving IV morphine frequently to help control pain in the left leg.  This was transitioned to IV Dilaudid 1 mg every 6 hours with IV Dilaudid 0.5 to 1 mg 2 hours as needed.  Although Dilaudid is documented as my discharge medication, there are no prescriptions being sent and she is going directly to a hospice home. She is eating less than 50% of her meals.  She is being transition to a hospice home facility.                 Discharge Instructions  Discharge Instructions     Increase activity slowly   Complete by: As directed    No wound care   Complete by: As directed       Allergies as of 05/06/2022       Reactions   Meloxicam Rash        Medication List     STOP taking these medications    acetaminophen 325 MG tablet Commonly known as: TYLENOL   bethanechol 10 MG tablet Commonly known as: URECHOLINE   cefdinir 300 MG capsule Commonly known as: OMNICEF   feeding supplement Liqd   furosemide 20 MG tablet Commonly known as: LASIX   linezolid 600 MG tablet Commonly known as: ZYVOX   losartan-hydrochlorothiazide 50-12.5 MG tablet Commonly known as: HYZAAR   Multi-Vitamins Tabs   nystatin powder Commonly known as: MYCOSTATIN/NYSTOP  Oxycodone HCl 10 MG Tabs       TAKE these medications    buPROPion 150 MG 24 hr tablet Commonly known as: WELLBUTRIN XL Take 150 mg by mouth daily.   CLEAR EYES COMPLETE OP Place 1 drop into both eyes 2 (two) times daily as needed (redness).   diphenhydrAMINE 25 mg capsule Commonly known as: BENADRYL Take 25 mg by mouth daily as needed for itching or allergies.   gabapentin 100 MG capsule Commonly known as: Neurontin Take 2 capsules (200 mg total) by mouth 3 (three) times daily.   HYDROmorphone 1 MG/ML injection Commonly known as:  DILAUDID Inject 1 mL (1 mg total) into the vein every 6 (six) hours.   HYDROmorphone 1 MG/ML injection Commonly known as: DILAUDID Inject 0.5-1 mLs (0.5-1 mg total) into the vein every 2 (two) hours as needed for moderate pain or severe pain.   omeprazole 20 MG capsule Commonly known as: PRILOSEC Take 20 mg by mouth daily as needed (heartburn).   polyethylene glycol 17 g packet Commonly known as: MIRALAX / GLYCOLAX Take 17 g by mouth daily as needed for mild constipation.   senna-docusate 8.6-50 MG tablet Commonly known as: Senokot-S Take 1 tablet by mouth 2 (two) times daily between meals as needed for mild constipation.   silver sulfADIAZINE 1 % cream Commonly known as: SILVADENE Apply topically 2 (two) times daily. What changed: how much to take   sodium hypochlorite 0.125 % Soln Commonly known as: DAKIN'S 1/4 STRENGTH Apply topically daily.   theophylline 400 MG 24 hr tablet Commonly known as: UNIPHYL Take 400 mg by mouth daily.            The results of significant diagnostics from this hospitalization (including imaging, microbiology, ancillary and laboratory) are listed below for reference.    VAS [place] LOWER EXTREMITY ARTERIAL DUPLEX  Result Date: 04/30/2022 LOWER EXTREMITY ARTERIAL DUPLEX STUDY Patient Name:  [name]  Date of Exam:   04/30/2022 Medical Rec #: [id number]        Accession #:    [id number] Date of Birth: January 22, 1941        Patient Gender: F Patient Age:   81 years Exam Location:  [facility] Procedure:      VAS [place] LOWER EXTREMITY ARTERIAL DUPLEX Referring Phys: Servando Snare --------------------------------------------------------------------------------  Indications: Ulceration, gangrene, and peripheral artery disease. High Risk Factors: Hypertension, past history of smoking.  Current ABI: N/A- patient unable to tolerate Limitations: Patient pain and bandaging Performing Technologist: Darlin Coco RDMS, RVT  Examination Guidelines: A complete  evaluation includes B-mode imaging, spectral Doppler, color Doppler, and power Doppler as needed of all accessible portions of each vessel. Bilateral testing is considered an integral part of a complete examination. Limited examinations for reoccurring indications may be performed as noted.   +----------+--------+-----+---------------+-----------------+------------------+ LEFT      PSV cm/sRatioStenosis       Waveform         Comments           +----------+--------+-----+---------------+-----------------+------------------+ EIA Distal120                                                             +----------+--------+-----+---------------+-----------------+------------------+ CFA Prox  85  audibly triphasic                   +----------+--------+-----+---------------+-----------------+------------------+ CFA Mid   160                         triphasic                           +----------+--------+-----+---------------+-----------------+------------------+ CFA Distal117                         triphasic                           +----------+--------+-----+---------------+-----------------+------------------+ DFA       94                          triphasic                           +----------+--------+-----+---------------+-----------------+------------------+ SFA Prox  113                         triphasic                           +----------+--------+-----+---------------+-----------------+------------------+ SFA Mid   163                         triphasic                           +----------+--------+-----+---------------+-----------------+------------------+ SFA Distal135                         audibly triphasic                   +----------+--------+-----+---------------+-----------------+------------------+ POP Prox  109                         audibly triphasic                    +----------+--------+-----+---------------+-----------------+------------------+ POP Mid   113                         triphasic                           +----------+--------+-----+---------------+-----------------+------------------+ POP Distal73                                                              +----------+--------+-----+---------------+-----------------+------------------+ TP Trunk  189          50-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       Turbulent flow,  color aliasing     +----------+--------+-----+---------------+-----------------+------------------+ PTA Prox  52                          monophasic                          +----------+--------+-----+---------------+-----------------+------------------+ PTA Mid   52                          monophasic                          +----------+--------+-----+---------------+-----------------+------------------+ PERO Mid  78                          monophasic                          +----------+--------+-----+---------------+-----------------+------------------+ DP                                                     Unable to insonate                                                        secondary to                                                              patient pain and                                                          bandaging          +----------+--------+-----+---------------+-----------------+------------------+  Summary: Left: 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 Unable to insonate the dorsalis pedis artery. See technologist comments above.  See table(s) above for measurements and observations. Electronically signed by Harold Barban MD on 04/30/2022 at 10:50:28 PM.    Final    DG Foot Complete Left  Result Date: 04/28/2022 CLINICAL DATA:  rule out  osteo.  Pre-existing left foot wound. EXAM: LEFT FOOT - COMPLETE 3+ VIEW COMPARISON:  None Available. FINDINGS: No cortical erosion or destruction. There is no evidence of fracture or dislocation. Degenerative changes of the distal interphalangeal joints. Diffuse mild subcutaneus soft tissue edema. IMPRESSION: 1. No radiographic findings suggest osteomyelitis. If high clinical suspicion, please consider MRI (with intravenous contrast if GFR greater than 30). 2.  No acute displaced fracture or dislocation. Electronically Signed   By: Iven Finn M.D.   On: 04/28/2022 19:39   DG Ankle Complete Left  Result Date: 04/28/2022 CLINICAL DATA:  Wound. EXAM: LEFT ANKLE COMPLETE - 3+ VIEW COMPARISON:  Tibia/fibular  radiographs 06/24/2021 FINDINGS: No erosion, periosteal reaction, or bone destruction. Normal ankle alignment. Preserved ankle mortise. No fracture. Small plantar calcaneal spur and Achilles tendon enthesophyte. Generalized soft tissue edema. No radiopaque foreign body or soft tissue gas. IMPRESSION: 1. Generalized soft tissue edema. No radiographic findings of osteomyelitis. 2. Small plantar calcaneal spur and Achilles tendon enthesophyte. Electronically Signed   By: Keith Rake M.D.   On: 04/28/2022 19:39   Labs:   Basic Metabolic Panel: Recent Labs  Lab 04/30/22 0439 05/01/22 0319  NA 139 139  K 2.8* 4.0  CL 105 109  CO2 21* 24  GLUCOSE 110* 96  BUN 36* 20  CREATININE 1.21* 0.89  CALCIUM 8.5* 8.7*  MG  --  1.8  PHOS 2.7  --      CBC: Recent Labs  Lab 04/30/22 0439 05/01/22 0319  WBC 11.1* 8.4  NEUTROABS  --  5.4  HGB 11.8* 12.2  HCT 34.0* 35.7*  MCV 96.3 97.5  PLT 292 313         SIGNED:   Debbe Odea, MD  [facility] 05/06/2022, 11:07 AM

## 2022-05-06 MED ORDER — HYDROMORPHONE HCL 1 MG/ML IJ SOLN
1.0000 mg | Freq: Once | INTRAMUSCULAR | Status: AC
  Administered 2022-05-06: 1 mg via INTRAVENOUS
  Filled 2022-05-06: qty 1

## 2022-05-06 NOTE — Progress Notes (Addendum)
[employer], contracting [facility]) Hospital Liaison Note  Referral received for patient/family interest in [facility]. Chart under review by [facility] physician.   Hospice eligibility confirmed.  Bed offered today and accepted for [facility]. Unit RN please call report to [phone] prior to patient leaving the unit. Please send signed DNR and paperwork with patient.   Please call with any questions or concerns. Thank you  Dionicio Stall, [facility] Liaison  [phone]

## 2022-05-06 NOTE — TOC Transition Note (Signed)
Transition of Care [facility]) - CM/SW Discharge Note   Patient Details  Name: [name] MRN: [id number] Date of Birth: 03/15/1941  Transition of Care [facility]) CM/SW Contact:  Bartholome Bill, RN Phone Number: 05/06/2022, 1:32 PM   Clinical Narrative:    Pt to dc to [facility] today. RN has called report to Charity fundraiser at [facility]. Daughter has been alerted of transport. Yellow DNR on the chart.

## 2022-05-06 NOTE — Progress Notes (Signed)
[facility] Short progress note  She appears quite comfortable on exam.  Hospice bed is available today.  Please see my discharge summary from 05/05/2012.  Calvert Cantor, MD

## 2022-05-06 NOTE — Progress Notes (Signed)
Palliative Medicine Progress Note   Patient Name: [name]       Date: 05/06/2022 DOB: 07-01-1941  Age: 81 y.o. MRN#: [id number] Attending Physician: Debbe Odea, MD Primary Care Physician: Nickola Major, MD Admit Date: 04/28/2022  Reason for Follow-up: Symptom management, end-of-life care  HPI/Patient Profile: 81 y.o. female  with past medical history of dementia, CKD stage IIIa, asthma, hypertension, GERD, depression, chronic low back pain, and chronic left lower extremity ulceration.  She presented to [facility] emergency department on 04/28/2022 with worsening lower extremity wounds and necrosis.  In the ED, she was found to have fever, acute kidney injury, and infected left leg.  Admitted to hospitalist service. Patient has been evaluated by Ortho, and recommended to have left AKA. After discussions with the surgeon, the patient's stepdaughter and husband opted for comfort care.  The patient's husband is admitted to [facility] with a new diagnosis of metastatic cancer.  On 6/10 she was transferred to [facility] to meet with her husband before being transitioned to hospice home. She is receiving Dilaudid IV very frequently to control pain in her left leg.  She is also taking hydrocodone once a day  Subjective: Resting in bed, no distress, leg noted, pain medication needs noted, labs noted. Confused at times.  Objective:  Physical Exam Vitals reviewed.  Constitutional:      General: She is not in acute distress.    Appearance: She is ill-appearing.  Pulmonary:     Effort: Pulmonary effort is normal.  Skin:    Comments: LLE wound with drainage, wrapped in kerlix  Psychiatric:        Cognition and Memory: Cognition is impaired. Memory is impaired.       Pictures  also noted on chart.       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 (HCC) Active Problems:   Paresthesia   Polyneuropathy   Cellulitis of left lower extremity   Chronic major depressive disorder   GERD (gastroesophageal reflux disease)   Hearing loss   Chronic obstructive asthma (HCC)   Venous stasis ulcer of left calf with fat layer exposed without varicose veins (HCC)   Hypertension   AKI (acute kidney injury) ([place])   PVD (peripheral vascular disease) (HCC)   Gangrene of left foot (HCC)   End of life care   Hypokalemia    Recommendations/Plan: Continue full comfort measures DNR/DNI as previously documented Opioid rotated from scheduled and PRN IV morphine to scheduled and PRN IV Dilaudid due to chronic renal impairment.  continue wound care Call placed and discussed with daughter Lattie Haw, patient under consideration for residential hospice.    Symptom Management:  Dilaudid IV scheduled and PRN. Also add IV Dilaudid for incident pain before dressing changes as per discussions with patient and daughter.  Lorazepam (ATIVAN) prn for anxiety Haloperidol (HALDOL) prn for agitation  Glycopyrrolate (ROBINUL) for excessive secretions Ondansetron (ZOFRAN) prn for nausea Polyvinyl alcohol (LIQUIFILM TEARS) prn for dry eyes Antiseptic oral rinse (BIOTENE) prn for dry mouth  Prognosis:  < 2 weeks  Discharge Planning: Recommend residential hospice.   Thank you for allowing the Palliative Medicine Team to assist in the care of this patient.   MDM - moderate   Loistine Chance,  MD   Please contact Palliative Medicine Team phone at [phone] for questions and concerns.  For individual providers, please see AMION.

## 2022-05-06 NOTE — Progress Notes (Addendum)
Called [employer] and report given to nurse Tiffany.

## 2022-05-06 NOTE — Plan of Care (Signed)
  Problem: Pain Managment: Goal: General experience of comfort will improve Outcome: Progressing   Problem: Skin Integrity: Goal: Risk for impaired skin integrity will decrease Outcome: Progressing   

## 2022-09-10 ENCOUNTER — Other Ambulatory Visit: Payer: Self-pay | Admitting: Family Medicine

## 2022-09-10 DIAGNOSIS — R29810 Facial weakness: Secondary | ICD-10-CM

## 2022-10-30 ENCOUNTER — Ambulatory Visit
Admission: RE | Admit: 2022-10-30 | Discharge: 2022-10-30 | Disposition: A | Discharge status: Home / Self Care | Payer: Medicare Other | Source: Ambulatory Visit | Attending: Family Medicine | Admitting: Family Medicine

## 2022-10-30 ENCOUNTER — Encounter (HOSPITAL_BASED_OUTPATIENT_CLINIC_OR_DEPARTMENT_OTHER): Payer: Medicare Other | Attending: Internal Medicine | Admitting: General Surgery

## 2022-10-30 DIAGNOSIS — M1990 Unspecified osteoarthritis, unspecified site: Secondary | ICD-10-CM | POA: Diagnosis not present

## 2022-10-30 DIAGNOSIS — N183 Chronic kidney disease, stage 3 unspecified: Secondary | ICD-10-CM | POA: Diagnosis not present

## 2022-10-30 DIAGNOSIS — I739 Peripheral vascular disease, unspecified: Secondary | ICD-10-CM | POA: Diagnosis not present

## 2022-10-30 DIAGNOSIS — I129 Hypertensive chronic kidney disease with stage 1 through stage 4 chronic kidney disease, or unspecified chronic kidney disease: Secondary | ICD-10-CM | POA: Insufficient documentation

## 2022-10-30 DIAGNOSIS — L97812 Non-pressure chronic ulcer of other part of right lower leg with fat layer exposed: Secondary | ICD-10-CM | POA: Diagnosis present

## 2022-10-30 DIAGNOSIS — I872 Venous insufficiency (chronic) (peripheral): Secondary | ICD-10-CM | POA: Insufficient documentation

## 2022-10-30 DIAGNOSIS — J4489 Other specified chronic obstructive pulmonary disease: Secondary | ICD-10-CM | POA: Insufficient documentation

## 2022-10-30 DIAGNOSIS — R29810 Facial weakness: Secondary | ICD-10-CM

## 2022-10-30 NOTE — Progress Notes (Addendum)
[name] [name] ([id number]) [id number] Page 1 of 10 Visit Report for 10/30/2022 Chief Complaint Document Details Patient Name: Date of Service: [name] [name] [name] [name] 10/30/2022 8:00 A M Medical Record Number: [id number] Patient Account Number: [id number] Date of Birth/Sex: Treating RN: 08-14-1941 (81 y.o. F) Primary Care Provider: Brett [name] Other Clinician: Referring Provider: Treating Provider/Extender: [name] [name] Weeks in Treatment: 0 Information Obtained from: Patient Chief Complaint Patient presents for treatment of open ulcers due to venous insufficiency Electronic Signature(s) Signed: 10/30/2022 9:28:27 AM By: [name] Guess MD FACS Previous Signature: 10/30/2022 8:27:51 AM Version By: [name] Guess MD FACS Entered By: [name] [name] on 10/30/2022 09:28:27 -------------------------------------------------------------------------------- Debridement Details Patient Name: Date of Service: [name] [name] [name] [name] 10/30/2022 8:00 A M Medical Record Number: [id number] Patient Account Number: [id number] Date of Birth/Sex: Treating RN: [birth year] (81 y.o. [name] [name] Primary Care Provider: Brett [name] Other Clinician: Referring Provider: Treating Provider/Extender: [name] [name] Weeks in Treatment: 0 Debridement Performed for Assessment: Wound #1 Right,Proximal,Anterior Lower Leg Performed By: Physician [name] Guess, MD Debridement Type: Debridement Severity of Tissue Pre Debridement: Fat layer exposed Level of Consciousness (Pre-procedure): Awake and Alert Pre-procedure Verification/Time Out Yes - 09:20 Taken: Start Time: 09:20 Pain Control: Lidocaine 4% T opical Solution T Area Debrided (L x W): otal 9 (cm) x 5.5 (cm) = 49.5 (cm) Tissue and other material debrided: Non-Viable, Slough, Subcutaneous, Slough Level: Skin/Subcutaneous Tissue Debridement Description: Excisional Instrument:  Curette Bleeding: Minimum Hemostasis Achieved: Pressure End Time: 09:22 Procedural Pain: 0 Post Procedural Pain: 0 Response to Treatment: Procedure was tolerated well Level of Consciousness (Post- Awake and Alert procedure): Post Debridement Measurements of Total Wound Length: (cm) 9 Width: (cm) 5.5 Depth: (cm) 0.1 Volume: (cm) 3.888 Character of Wound/Ulcer Post Debridement: Improved [name], [name] [name] ([id number]) [id number] Page 2 of 10 Severity of Tissue Post Debridement: Fat layer exposed Post Procedure Diagnosis Same as Pre-procedure Notes Scribed for Dr. Lady Gary by [name] [name] Electronic Signature(s) Signed: 10/30/2022 10:43:45 AM By: [name] Guess MD FACS Signed: 10/30/2022 5:19:28 PM By: [name] Schwalbe RN Entered By: [name] [name] on 10/30/2022 09:23:49 -------------------------------------------------------------------------------- Debridement Details Patient Name: Date of Service: [name] [name] [name] [name] 10/30/2022 8:00 A M Medical Record Number: [id number] Patient Account Number: [id number] Date of Birth/Sex: Treating RN: [birth year] (81 y.o. [name] [name] Primary Care Provider: Brett [name] Other Clinician: Referring Provider: Treating Provider/Extender: [name] [name] Weeks in Treatment: 0 Debridement Performed for Assessment: Wound #2 Right,Distal,Anterior Lower Leg Performed By: Physician [name] Guess, MD Debridement Type: Debridement Severity of Tissue Pre Debridement: Fat layer exposed Level of Consciousness (Pre-procedure): Awake and Alert Pre-procedure Verification/Time Out Yes - 09:20 Taken: Start Time: 09:20 Pain Control: Lidocaine 4% T opical Solution T Area Debrided (L x W): otal 6.5 (cm) x 9 (cm) = 58.5 (cm) Tissue and other material debrided: Non-Viable, Slough, Subcutaneous, Slough Level: Skin/Subcutaneous Tissue Debridement Description: Excisional Instrument: Curette Bleeding:  Minimum Hemostasis Achieved: Pressure End Time: 09:22 Procedural Pain: 0 Post Procedural Pain: 0 Response to Treatment: Procedure was tolerated well Level of Consciousness (Post- Awake and Alert procedure): Post Debridement Measurements of Total Wound Length: (cm) 6.5 Width: (cm) 9 Depth: (cm) 0.1 Volume: (cm) 4.595 Character of Wound/Ulcer Post Debridement: Improved Severity of Tissue Post Debridement: Fat layer exposed Post Procedure Diagnosis Same as Pre-procedure Notes Scribed by [name] [name] for Dr. Lady Gary Electronic Signature(s) Signed: 10/30/2022 10:43:45 AM By: [name] Guess MD FACS Signed: 10/30/2022 5:19:28 PM By: [name] Schwalbe RN Entered By: [name] [name] on 10/30/2022 09:25:47 [name] [name] (  [id number]) [id number] Page 3 of 10 -------------------------------------------------------------------------------- HPI Details Patient Name: Date of Service: [name] [name] [name] [name] 10/30/2022 8:00 A M Medical Record Number: [id number] Patient Account Number: [id number] Date of Birth/Sex: Treating RN: 1941/09/17 (81 y.o. 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 The patient and her family elected for comfort care however somehow she managed to heal her wounds. From the electronic medical record, it looks like she was receiving home health services and being followed by her primary care provider. At the beginning of November, she saw her PCP again and had new ulcers on her right leg. Home health services were not  available to assist the patient and she was referred to the wound care center for further evaluation and management. On the right anterior lower leg, she has multiple ulcers of varying degrees and depths. Some are limited to breakdown of skin and others are deeper, into the fat layer. There is slough and some eschar accumulation. She has periwound erythema and 1-2+ edema. Electronic Signature(s) Signed: 10/30/2022 9:35:17 AM By: [name] Guess MD FACS Previous Signature: 10/30/2022 8:56:17 AM Version By: [name] Guess MD FACS Entered By: [name] [name] on 10/30/2022 09:35:17 -------------------------------------------------------------------------------- Physical Exam Details Patient Name: Date of Service: [name] [name] [name] [name] 10/30/2022 8:00 A M Medical Record Number: [id number] Patient Account Number: [id number] Date of Birth/Sex: Treating RN: [birth year] (81 y.o. F) Primary Care Provider: Brett [name] Other Clinician: Referring Provider: Treating Provider/Extender: [name] [name] Weeks in Treatment: 0 Constitutional Mildly hypertensive. Mildly tachycardic, asymptomatic. . . No acute distress. Respiratory Normal work of breathing on room air.. Cardiovascular . Notes 10/30/2022: On the right anterior lower leg, she has multiple ulcers of varying degrees and depths. Some are limited to breakdown of skin and others are deeper, into the fat layer. There is slough and some eschar accumulation. She has periwound erythema and 1-2+ edema. Electronic Signature(s) Signed: 10/30/2022 9:36:14 AM By: [name] Guess MD FACS Entered By: [name] [name] on 10/30/2022 09:36:14 Sulak, [name] [name] ([id number]) [id number] Page 4 of 10 -------------------------------------------------------------------------------- Physician Orders Details Patient Name: Date of Service: [name] [name] [name] [name] 10/30/2022 8:00 A M Medical Record Number: [id number] Patient  Account Number: [id number] Date of Birth/Sex: Treating RN: 1941/03/30 (81 y.o. 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 - Dr. Lady Gary [name] [name] Room 3 Return A Anesthetic (In clinic) Topical Lidocaine 4% applied to wound bed - Used in clinic Bathing/ Shower/ Hygiene May shower with protection but do not get wound dressing(s) wet. - +++Do not get compression wraps wet on right leg++++ Other Bathing/Shower/Hygiene Orders/Instructions: - May take a sponge bath Edema Control - Lymphedema / SCD / Other Moisturize legs daily. - Use lotion on left leg-STOP using the steroid  cream Other Edema Control Orders/Instructions: - Elevate Legs through out the day Wound Treatment Wound #1 - Lower Leg Wound Laterality: Right, Anterior, Proximal Cleanser: Soap and Water 1 x Per Week/30 Days Discharge Instructions: May shower and wash wound with dial antibacterial soap and water prior to dressing change. Cleanser: Wound Cleanser 1 x Per Week/30 Days Discharge Instructions: Cleanse the wound with wound cleanser prior to applying a clean dressing using gauze sponges, not tissue or cotton balls. Peri-Wound Care: Triamcinolone 15 (g) 1 x Per Week/30 Days Discharge Instructions: Use triamcinolone 15 (g) as directed Peri-Wound Care: Sween Lotion (Moisturizing lotion) 1 x Per Week/30 Days Discharge Instructions: Apply moisturizing  lotion as directed Prim Dressing: Sorbalgon AG Dressing, 4x4 (in/in) 1 x Per Week/30 Days ary Discharge Instructions: Apply to wound bed as instructed Compression Wrap: ThreePress (3 layer compression wrap) 1 x Per Week/30 Days Discharge Instructions: Apply three layer compression as directed. Wound #2 - Lower Leg Wound Laterality: Right, Anterior, Distal Cleanser: Soap and Water 1 x Per Week/30 Days Discharge Instructions: May shower and wash wound with dial antibacterial soap and water prior to dressing change. Cleanser: Wound Cleanser 1 x Per Week/30 Days Discharge Instructions: Cleanse the wound with wound cleanser prior to applying a clean dressing using gauze sponges, not tissue or cotton balls. Peri-Wound Care: Triamcinolone 15 (g) 1 x Per Week/30 Days Discharge Instructions: Use triamcinolone 15 (g) as directed Peri-Wound Care: Sween Lotion (Moisturizing lotion) 1 x Per Week/30 Days Discharge Instructions: Apply moisturizing lotion as directed Prim Dressing: Sorbalgon AG Dressing, 4x4 (in/in) 1 x Per Week/30 Days ary Discharge Instructions: Apply to wound bed as instructed Compression Wrap: ThreePress (3 layer compression wrap) 1 x Per Week/30 Days Discharge Instructions: Apply three layer compression as directed. Compression Wrap: Tubular Net#5 1 x Per Week/30 Days Lard, [name] [name] ([id number]) [id number] Page 5 of 10 Compression Stockings: Jobst Farrow Wrap 4000 (DME) Right Leg Compression Amount: 30-40 mmHG Discharge Instructions: Apply Renee Pain daily as instructed. Apply first thing in the morning, remove at night before bed. Electronic Signature(s) Signed: 10/30/2022 5:19:28 PM By: [name] Schwalbe RN Signed: 10/31/2022 7:32:56 AM By: [name] Guess MD FACS Previous Signature: 10/30/2022 10:43:45 AM Version By: [name] Guess MD FACS Entered By: [name] [name] on 10/30/2022  17:11:44 -------------------------------------------------------------------------------- Problem List Details Patient Name: Date of Service: [name] [name] [name] [name] 10/30/2022 8:00 A M Medical Record Number: [id number] Patient Account Number: [id number] Date of Birth/Sex: Treating RN: [birth year] (81 y.o. F) Primary Care Provider: Brett [name] Other Clinician: Referring Provider: Treating Provider/Extender: [name] [name] Weeks in Treatment: 0 Active Problems ICD-10 Encounter Code Description Active Date MDM Diagnosis L97.819 Non-pressure chronic ulcer of other part of right lower leg with unspecified 10/30/2022 No Yes severity I87.2 Venous insufficiency (chronic) (peripheral) 10/30/2022 No Yes J44.89 Other specified chronic obstructive pulmonary disease 10/30/2022 No Yes I73.9 Peripheral vascular disease, unspecified 10/30/2022 No Yes Inactive Problems Resolved Problems Electronic Signature(s) Signed: 10/30/2022 9:28:14 AM By: [name] Guess MD FACS Previous Signature: 10/30/2022 8:27:20 AM Version By: [name] Guess MD FACS Entered By: [name] [name] on 10/30/2022 09:28:14 -------------------------------------------------------------------------------- Progress Note Details Patient Name: Date of Service: [name] [name] [name] [name] 10/30/2022 8:00 A M Medical Record Number: [id number] Patient Account Number: [id number] Date of Birth/Sex: Treating RN: 1941/03/09 (81 y.o. 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 The patient and her family elected for comfort care however somehow she managed to heal her wounds. From the electronic medical record, it looks like she was receiving home health services and being followed by her primary care provider. At the beginning of November, she saw her PCP again and had new ulcers on her right leg. Home health services were not available to assist the patient and she was referred to the wound care center for further evaluation and management. On the right anterior lower leg, she has multiple ulcers of varying degrees and depths. Some are limited to breakdown of skin and others are deeper, into the fat layer. There is slough and some eschar accumulation. She has periwound erythema and 1-2+ edema. Patient History Information obtained from Patient. Allergies meloxicam (Severity: Severe) Social History Never smoker, Marital Status - Widowed, Alcohol Use - Never, Drug Use - No History, Caffeine Use - Daily. Medical History Respiratory Patient has history of Asthma Cardiovascular Patient has history of Hypertension Musculoskeletal Patient has history of Osteoarthritis Hospitalization/Surgery History - Bilateral Cataract Extraction; Bone Graft-jaw; Arm Surgery (d/t dog bite). Medical A Surgical History Notes nd Eyes Hx: Cataracts Ear/Nose/Mouth/Throat Hx: wearing Bilateral hearing aids Genitourinary Chronic Renal Impairment Stage 3 Psychiatric Hx: Depression Review of Systems (ROS) Constitutional Symptoms (General Health) Denies complaints or symptoms of Fatigue, Fever, Chills, Marked Weight Change. Eyes Denies complaints or symptoms of Dry Eyes, Vision Changes, Glasses / Contacts. Objective Constitutional Mildly hypertensive. Mildly tachycardic, asymptomatic. No acute  distress. Vitals Time Taken: 8:10 AM, Height: 64 in, Weight: 180 lbs, BMI: 30.9, Temperature: 97.5 F, Pulse: 109 bpm, Respiratory Rate: 20 breaths/min, Blood Pressure: 143/86 mmHg. Respiratory Normal work of breathing on room air.. General Notes: 10/30/2022: On the right anterior lower leg, she has multiple ulcers of varying degrees and depths. Some are limited to breakdown of skin and others are deeper, into the fat layer. There is slough and some eschar accumulation. She has periwound erythema and 1-2+ edema. Integumentary (Hair, Skin) Wound #1 status is Open. Original cause of wound was Gradually Appeared. The date acquired was: 09/30/2022. The wound is located on the Right,Proximal,Anterior Lower Leg. The wound measures 9cm length x 5.5cm width x 0.1cm depth; 38.877cm^2 area and 3.888cm^3 volume. There is Fat Layer (Subcutaneous Tissue) exposed. There is no tunneling or undermining noted. There is a medium amount of serosanguineous drainage noted. There is large (67- 100%) red, pink granulation within the wound bed. There is a small (1-33%) amount of necrotic tissue within the wound bed including Eschar and Adherent Slough. The periwound skin appearance had no abnormalities noted for moisture. The periwound skin appearance exhibited: Scarring, Hemosiderin Staining. [name] [name] ([id number]) [id number] Page 7 of 10 Wound #2 status is Open. Original cause of wound was Gradually Appeared. The date acquired was: 09/30/2022. The wound is located on the Right,Distal,Anterior Lower Leg. The wound measures 6.5cm length x 9cm width x 0.1cm depth; 45.946cm^2 area and 4.595cm^3 volume. There is Fat Layer (Subcutaneous Tissue) exposed. There is no tunneling or undermining noted. There is a medium amount of serosanguineous drainage noted. There is medium (34-66%) red granulation within the wound bed. There is a medium (34-66%) amount of  necrotic tissue within the wound bed  including Eschar and Adherent Slough. The periwound skin appearance had no abnormalities noted for moisture. The periwound skin appearance exhibited: Scarring, Hemosiderin Staining. Periwound temperature was noted as No Abnormality. Assessment Active Problems ICD-10 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ity Venous insufficiency (chronic) (peripheral) Other specified chronic obstructive pulmonary disease Peripheral vascular disease, unspecified Procedures Wound #1 Pre-procedure diagnosis of Wound #1 is a Venous Leg Ulcer located on the Right,Proximal,Anterior Lower Leg .Severity of Tissue Pre Debridement is: Fat layer exposed. There was a Excisional Skin/Subcutaneous Tissue Debridement with a total area of 49.5 sq cm performed by [name] Guessannon, Saory Carriero, MD. With the following instrument(s): Curette to remove Non-Viable tissue/material. Material removed includes Subcutaneous Tissue and Slough and after achieving pain control using Lidocaine 4% T opical Solution. No specimens were taken. A time out was conducted at 09:20, prior to the start of the procedure. A Minimum amount of bleeding was controlled with Pressure. The procedure was tolerated well with a pain level of 0 throughout and a pain level of 0 following the procedure. Post Debridement Measurements: 9cm length x 5.5cm width x 0.1cm depth; 3.888cm^3 volume. Character of Wound/Ulcer Post Debridement is improved. Severity of Tissue Post Debridement is: Fat layer exposed. Post procedure Diagnosis Wound #1: Same as Pre-Procedure General Notes: Scribed for Dr. Lady Garyannon by [name] [name]. Pre-procedure diagnosis of Wound #1 is a Venous Leg Ulcer located on the Right,Proximal,Anterior Lower Leg . There was a Three Layer Compression Therapy Procedure by [name] SchwalbeScotton, Joanne, RN. Post procedure Diagnosis Wound #1: Same as Pre-Procedure Wound #2 Pre-procedure diagnosis of Wound #2 is a Venous Leg Ulcer located on the  Right,Distal,Anterior Lower Leg .Severity of Tissue Pre Debridement is: Fat layer exposed. There was a Excisional Skin/Subcutaneous Tissue Debridement with a total area of 58.5 sq cm performed by [name] Guessannon, Herbert Aguinaldo, MD. With the following instrument(s): Curette to remove Non-Viable tissue/material. Material removed includes Subcutaneous Tissue and Slough and after achieving pain control using Lidocaine 4% T opical Solution. No specimens were taken. A time out was conducted at 09:20, prior to the start of the procedure. A Minimum amount of bleeding was controlled with Pressure. The procedure was tolerated well with a pain level of 0 throughout and a pain level of 0 following the procedure. Post Debridement Measurements: 6.5cm length x 9cm width x 0.1cm depth; 4.595cm^3 volume. Character of Wound/Ulcer Post Debridement is improved. Severity of Tissue Post Debridement is: Fat layer exposed. Post procedure Diagnosis Wound #2: Same as Pre-Procedure General Notes: Scribed by [name] [name] for Dr. Lady Garyannon. Plan Follow-up Appointments: Return Appointment in 1 week. - Dr. Lady Garyannon /Dr. Leanord Hawkingobson Room 3 Anesthetic: (In clinic) Topical Lidocaine 4% applied to wound bed - Used in clinic Bathing/ Shower/ Hygiene: May shower with protection but do not get wound dressing(s) wet. - +++Do not get compression wraps wet on right leg++++ Other Bathing/Shower/Hygiene Orders/Instructions: - May take a sponge bath Edema Control - Lymphedema / SCD / Other: Moisturize legs daily. - Use lotion on left leg-STOP using the steroid cream Other Edema Control Orders/Instructions: - Elevate Legs through out the day WOUND #1: - Lower Leg Wound Laterality: Right, Anterior, Proximal Cleanser: Soap and Water 1 x Per Week/30 Days Discharge Instructions: May shower and wash wound with dial antibacterial soap and water prior to dressing change. Cleanser: Wound Cleanser 1 x Per Week/30 Days Discharge Instructions: Cleanse the wound with wound  cleanser prior to applying a clean dressing using gauze sponges, not tissue or  cotton balls. Peri-Wound Care: Triamcinolone 15 (g) 1 x Per Week/30 Days Discharge Instructions: Use triamcinolone 15 (g) as directed Peri-Wound Care: Sween Lotion (Moisturizing lotion) 1 x Per Week/30 Days Discharge Instructions: Apply moisturizing lotion as directed Prim Dressing: Sorbalgon AG Dressing, 4x4 (in/in) 1 x Per Week/30 Days ary Discharge Instructions: Apply to wound bed as instructed Com pression Wrap: ThreePress (3 layer compression wrap) 1 x Per Week/30 Days Discharge Instructions: Apply three layer compression as directed. WOUND #2: - Lower Leg Wound Laterality: Right, Anterior, Distal Cleanser: Soap and Water 1 x Per Week/30 Days Discharge Instructions: May shower and wash wound with dial antibacterial soap and water prior to dressing change. Cleanser: Wound Cleanser 1 x Per Week/30 Days Zerby, [name] [name] ([id number]) [id number] Page 8 of 10 Discharge Instructions: Cleanse the wound with wound cleanser prior to applying a clean dressing using gauze sponges, not tissue or cotton balls. Peri-Wound Care: Triamcinolone 15 (g) 1 x Per Week/30 Days Discharge Instructions: Use triamcinolone 15 (g) as directed Peri-Wound Care: Sween Lotion (Moisturizing lotion) 1 x Per Week/30 Days Discharge Instructions: Apply moisturizing lotion as directed Prim Dressing: Sorbalgon AG Dressing, 4x4 (in/in) 1 x Per Week/30 Days ary Discharge Instructions: Apply to wound bed as instructed Com pression Wrap: ThreePress (3 layer compression wrap) 1 x Per Week/30 Days Discharge Instructions: Apply three layer compression as directed. Com pression Wrap: Tubular Net#5 1 x Per Week/30 Days Com pression Stockings: Jobst Farrow Wrap 4000 (DME) Compression Amount: 30-40 mmHg (right) Discharge Instructions: Apply Renee Pain daily as instructed. Apply first thing in the morning, remove at night before  bed. 10/30/2022: This is an 81 year old woman with history of prior venous ulceration. She is here with new wounds on her right lower extremity. On the right anterior lower leg, she has multiple ulcers of varying degrees and depths. Some are limited to breakdown of skin and others are deeper, into the fat layer. There is slough and some eschar accumulation. She has periwound erythema and 1-2+ edema. I used a curette to debride slough and nonviable subcutaneous tissue from her wounds. We will use silver alginate and 3 layer compression. She says that she does have compression stockings at home, but finds them extremely difficult to apply so she does not wear them regularly. She may benefit from a juxta lite or Farrow wrap alternative. She also mentions that her primary care provider has had her applying a steroid cream to her legs because of her thin skin. I do not see how a topical corticosteroid is likely to benefit thin skin and may actually be detrimental. I have asked her to discontinue this practice and simply use a good moisturizer on her left leg while we work on healing the right. She will follow-up in 1 week. Electronic Signature(s) Signed: 10/30/2022 5:19:28 PM By: [name] Schwalbe RN Signed: 10/31/2022 7:32:56 AM By: [name] Guess MD FACS Previous Signature: 10/30/2022 9:40:15 AM Version By: [name] Guess MD FACS Entered By: [name] [name] on 10/30/2022 17:14:54 -------------------------------------------------------------------------------- HxROS Details Patient Name: Date of Service: [name] [name] [name] [name] 10/30/2022 8:00 A M Medical Record Number: [id number] Patient Account Number: [id number] Date of Birth/Sex: Treating RN: 07-19-1941 (81 y.o. [name] [name] Primary Care Provider: Brett [name] Other Clinician: Referring Provider: Treating Provider/Extender: [name] [name] Weeks in Treatment: 0 Information Obtained From Patient Constitutional Symptoms  (General Health) Complaints and Symptoms: Negative for: Fatigue; Fever; Chills; Marked Weight Change Eyes Complaints and Symptoms: Negative for: Dry Eyes; Vision Changes; Glasses / Contacts Medical History:  Past Medical History Notes: Hx: Cataracts Ear/Nose/Mouth/Throat Medical History: Past Medical History Notes: Hx: wearing Bilateral hearing aids Respiratory Medical History: Positive for: Asthma Cardiovascular [place], [name] [name] ([id number]) [id number] Page 9 of 10 Medical History: Positive for: Hypertension Genitourinary Medical History: Past Medical History Notes: Chronic Renal Impairment Stage 3 Musculoskeletal Medical History: Positive for: Osteoarthritis Oncologic Psychiatric Medical History: Past Medical History Notes: Hx: Depression Immunizations Pneumococcal Vaccine: Received Pneumococcal Vaccination: Yes Received Pneumococcal Vaccination On or After 60th Birthday: Yes Implantable Devices None Hospitalization / Surgery History Type of Hospitalization/Surgery Bilateral Cataract Extraction; Bone Graft-jaw; Arm Surgery (d/t dog bite) Family and Social History Never smoker; Marital Status - Widowed; Alcohol Use: Never; Drug Use: No History; Caffeine Use: Daily; Financial Concerns: No; Food, Clothing or Shelter Needs: No; Support System Lacking: No; Transportation Concerns: No Electronic Signature(s) Signed: 10/30/2022 5:19:28 PM By: [name] Schwalbe RN Signed: 10/31/2022 7:32:56 AM By: [name] Guess MD FACS Entered By: [name] [name] on 10/30/2022 17:00:00 -------------------------------------------------------------------------------- SuperBill Details Patient Name: Date of Service: [name] [name] [name] [name] 10/30/2022 Medical Record Number: [id number] Patient Account Number: [id number] Date of Birth/Sex: Treating RN: [birth year] (81 y.o. F) Primary Care Provider: Brett [name] Other Clinician: Referring Provider: Treating  Provider/Extender: [name] [name] Weeks in Treatment: 0 Diagnosis Coding ICD-10 Codes Code Description [phone] 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ity I87.2 Venous insufficiency (chronic) (peripheral) J44.89 Other specified chronic obstructive pulmonary disease I73.9 Peripheral vascular disease, unspecified Facility Procedures : LAYLAH, RIGA Code: [id number] Reema ([id number] Description: 99214 - WOUND CARE VISIT-LEV 4 EST PT ) [id number] Modifier: 25 [id number] Quantity: 1 7.pdf Page 10 of 10 : 3 CPT4 Code: 6100012 110 IC L Description: 42 - DEB SUBQ TISSUE 20 SQ CM/< D-10 Diagnosis Description 97.819 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 Modifier: 1 ity Quantity: : 3 CPT4 Code: 6100018 110 IC L Description: 45 - DEB SUBQ TISS EA ADDL 20CM D-10 Diagnosis Description 97.819 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 Modifier: 5 ity Quantity: Physician Procedures : CPT4 Code Description Modifier [id number] 99204 - WC PHYS LEVEL 4 - NEW PT 25 ICD-10 Diagnosis Description L97.819 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ity I87.2 Venous insufficiency (chronic) (peripheral)  J44.89 Other specified chronic obstructive pulmonary disease I73.9 Peripheral vascular disease, unspecified Quantity: 1 : 0932355 11042 - WC PHYS SUBQ TISS 20 SQ CM ICD-10 Diagnosis Description L97.819 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ity Quantity: 1 : 7322025 11045 - WC PHYS SUBQ TISS EA ADDL 20 CM ICD-10 Diagnosis Description L97.819 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ity Quantity: 5 Electronic Signature(s) Signed: 10/30/2022 5:19:28 PM By: [name] Schwalbe RN Signed: 10/31/2022 7:32:56 AM By: [name] Guess MD  FACS Previous Signature: 10/30/2022 9:40:44 AM Version By: [name] Guess MD FACS Entered By: [name] [name] on 10/30/2022 17:08:04

## 2022-10-31 NOTE — Progress Notes (Signed)
[name] ([id number]) [id number] [id number] Page 1 of 4 Visit Report for 10/30/2022 Abuse Risk Screen Details Patient Name: Date of Service: [name] 10/30/2022 8:00 A M Medical Record Number: [id number] Patient Account Number: [id number] Date of Birth/Sex: Treating [name]: 01-06-1941 (81 y.o. [name] Primary Care [name]: [name] Other Clinician: Referring [name]: Treating [name]/Extender: [name] in Treatment: 0 Abuse Risk Screen Items Answer ABUSE RISK SCREEN: Has anyone close to you tried to hurt or harm you recentlyo No Do you feel uncomfortable with anyone in your familyo No Has anyone forced you do things that you didnt want to doo No Electronic Signature(s) Signed: 10/30/2022 5:19:28 PM By: [name] Entered By: [name] on 10/30/2022 17:01:22 -------------------------------------------------------------------------------- Activities of Daily Living Details Patient Name: Date of Service: [name] 10/30/2022 8:00 A M Medical Record Number: [id number] Patient Account Number: [id number] Date of Birth/Sex: Treating [name]: [birth year] (81 y.o. [name] Primary Care [name]: [name] Other Clinician: Referring [name]: Treating [name]/Extender: [name] in Treatment: 0 Activities of Daily Living Items Answer Activities of Daily Living (Please select one for each item) Drive Automobile Not Able T Medications ake Completely Able Use T elephone Completely Able Care for Appearance Completely Able Use T oilet Completely Able Bath / Shower Completely Able Dress Self Completely Able Feed Self Completely Able Walk Completely Able Get In / Out Bed Completely Able Housework Completely Able Prepare Meals Completely [place] for Self Need Assistance Electronic Signature(s) Signed:  10/30/2022 5:19:28 PM By: [name] Entered By: [name] on 10/30/2022 17:03:36 Hackley, [name] ([id number]) [id number] [id number] Page 2 of 4 -------------------------------------------------------------------------------- Education Screening Details Patient Name: Date of Service: [name] 10/30/2022 8:00 A M Medical Record Number: [id number] Patient Account Number: [id number] Date of Birth/Sex: Treating [name]: September 07, 1941 (81 y.o. [name] Primary Care [name]: [name] Other Clinician: Referring [name]: Treating [name]/Extender: [name] in Treatment: 0 Primary Learner Assessed: Patient Learning Preferences/Education Level/Primary Language Learning Preference: Explanation, Printed Material Highest Education Level: College or Above Preferred Language: English Cognitive Barrier Language Barrier: No Translator Needed: No Memory Deficit: No Emotional Barrier: No Cultural/Religious Beliefs Affecting Medical Care: No Physical Barrier Impaired Vision: No Impaired Hearing: No Decreased Hand dexterity: No Knowledge/Comprehension Knowledge Level: High Comprehension Level: High Ability to understand written instructions: High Ability to understand verbal instructions: High Motivation Anxiety Level: Calm Cooperation: Cooperative Education Importance: Acknowledges Need Interest in Health Problems: Asks Questions Perception: Coherent Willingness to Engage in Self-Management High Activities: Readiness to Engage in Self-Management High Activities: Electronic Signature(s) Signed: 10/30/2022 5:19:28 PM By: [name] Entered By: [name] on 10/30/2022 17:04:36 -------------------------------------------------------------------------------- Fall Risk Assessment Details Patient Name: Date of Service: [name] 10/30/2022 8:00 A M Medical Record Number:  [id number] Patient Account Number: [id number] Date of Birth/Sex: Treating [name]: [birth year] (81 y.o. [name] Primary Care Pailynn Vahey: [name] Other Clinician: Referring [name]: Treating [name]/Extender: [name] in Treatment: 0 Fall Risk Assessment Items Have you had 2 or more falls in the last 12 monthso 0 No [name] ([id number]) [id number] Page 3 of 4 Have you had any fall that resulted in injury in the last 12 monthso 0 No FALLS RISK SCREEN History of falling - immediate or within 3 months 0 No Secondary diagnosis (Do you have 2 or more medical diagnoseso) 0 No Ambulatory  aid None/bed rest/wheelchair/nurse 0 No Crutches/cane/walker 0 No Furniture 0 No Intravenous therapy Access/Saline/Heparin Lock 0 No Gait/Transferring Normal/ bed rest/ wheelchair 0 No Weak (short steps with or without shuffle, stooped but able to lift head while walking, may seek 0 No support from furniture) Impaired (short steps with shuffle, may have difficulty arising from chair, head down, impaired 0 No balance) Mental Status Oriented to own ability 0 No Electronic Signature(s) Signed: 10/30/2022 5:19:28 PM By: [name] Entered By: [name] on 10/30/2022 17:04:43 -------------------------------------------------------------------------------- Foot Assessment Details Patient Name: Date of Service: [name] 10/30/2022 8:00 A M Medical Record Number: [id number] Patient Account Number: [id number] Date of Birth/Sex: Treating [name]: 05/13/1941 (81 y.o. [name] Primary Care [name]: [name] Other Clinician: Referring [name]: Treating [name]/Extender: [name] [name] in Treatment: 0 Foot Assessment Items Site Locations + = Sensation present, - = Sensation absent, C = Callus, U = Ulcer R = Redness, W = Warmth, M = Maceration, PU = Pre-ulcerative  lesion F = Fissure, S = Swelling, D = Dryness Assessment Right: Left: Other Deformity: No No Prior Foot Ulcer: No No Prior Amputation: No No Charcot Joint: No No Ambulatory Status: Ambulatory With Help Assistance Device: [name] [name] [name] ([id number]) [phone] [id number] Page 4 of 4 Gait: Steady Electronic Signature(s) Signed: 10/30/2022 5:19:28 PM By: [name] Entered By: [name] on 10/30/2022 17:05:17 -------------------------------------------------------------------------------- Nutrition Risk Screening Details Patient Name: Date of Service: [name] 10/30/2022 8:00 A M Medical Record Number: [id number] Patient Account Number: [id number] Date of Birth/Sex: Treating [name]: 1941-07-26 (81 y.o. [name] Primary Care Sena Hoopingarner: [name] Other Clinician: Referring [name]: Treating Anala Whisenant/Extender: [name] [name] in Treatment: 0 Height (in): 64 Weight (lbs): 180 Body Mass Index (BMI): 30.9 Nutrition Risk Screening Items Score Screening NUTRITION RISK SCREEN: I have an illness or condition that made me change the kind and/or amount of food I eat 0 No I eat fewer than two meals per day 0 No I eat few fruits and vegetables, or milk products 0 No I have three or more drinks of beer, liquor or wine almost every day 0 No I have tooth or mouth problems that make it hard for me to eat 0 No I don't always have enough money to buy the food I need 0 No I eat alone most of the time 0 No I take three or more different prescribed or over-the-counter drugs a day 0 No Without wanting to, I have lost or gained 10 pounds in the last six months 0 No I am not always physically able to shop, cook and/or feed myself 0 No Nutrition Protocols Good Risk Protocol 0 No interventions needed Moderate Risk Protocol High Risk Proctocol Risk Level: Good Risk Score: 0 Electronic Signature(s) Signed:  10/30/2022 5:19:28 PM By: [name] Entered By: [name] on 10/30/2022 17:04:50

## 2022-10-31 NOTE — Progress Notes (Signed)
[name] ([id number]) [id number] Page 1 of 10 Visit Report for 10/30/2022 Allergy List Details Patient Name: Date of Service: [name] 10/30/2022 8:00 A M Medical Record Number: [id number] Patient Account Number: [id number] Date of Birth/Sex: Treating RN: 07/21/1941 (81 y.o. [name] Primary Care Phares Zaccone: Brett Fairy Other Clinician: Referring Tamie Minteer: Treating Fern Asmar/Extender: [name] Weeks in Treatment: 0 Allergies Active Allergies meloxicam Severity: Severe Allergy Notes Electronic Signature(s) Signed: 10/30/2022 5:19:28 PM By: Karie Schwalbe RN Entered By: Karie Schwalbe on 10/30/2022 09:20:18 -------------------------------------------------------------------------------- Arrival Information Details Patient Name: Date of Service: [name] 10/30/2022 8:00 A M Medical Record Number: [id number] Patient Account Number: [id number] Date of Birth/Sex: Treating RN: 11/11/1941 (81 y.o. F) Primary Care Miciah Covelli: Brett Fairy Other Clinician: Referring Zackory Pudlo: Treating Kailey Esquilin/Extender: [name] in Treatment: 0 Visit Information Patient Arrived: Dan Humphreys Arrival Time: 08:11 Accompanied By: Enos Fling Transfer Assistance: None Patient Identification Verified: Yes Secondary Verification Process Completed: Yes Electronic Signature(s) Signed: 10/30/2022 8:59:23 AM By: Dayton Scrape Entered By: Dayton Scrape on 10/30/2022 08:12:09 -------------------------------------------------------------------------------- Clinic Level of Care Assessment Details Patient Name: Date of Service: [name] 10/30/2022 8:00 A M Medical Record Number: [id number] Patient Account Number: [id number] Date of Birth/Sex: Treating RN: [birth year] (81 y[address]o. [name] [name], [name] ([id number]) [id number] Page 2 of 10 Primary Care Seneca Hoback:  Brett Fairy Other Clinician: Referring Tiffny Gemmer: Treating Skyanne Welle/Extender: [name] Weeks in Treatment: 0 Clinic Level of Care Assessment Items TOOL 1 Quantity Score X- 1 0 Use when EandM and Procedure is performed on INITIAL visit ASSESSMENTS - Nursing Assessment / Reassessment X- 1 20 General Physical Exam (combine w/ comprehensive assessment (listed just below) when performed on new pt. evals) X- 1 25 Comprehensive Assessment (HX, ROS, Risk Assessments, Wounds Hx, etc.) ASSESSMENTS - Wound and Skin Assessment / Reassessment X- 1 10 Dermatologic / Skin Assessment (not related to wound area) ASSESSMENTS - Ostomy and/or Continence Assessment and Care []  - 0 Incontinence Assessment and Management []  - 0 Ostomy Care Assessment and Management (repouching, etc.) PROCESS - Coordination of Care X - Simple Patient / Family Education for ongoing care 1 15 []  - 0 Complex (extensive) Patient / Family Education for ongoing care X- 1 10 Staff obtains , Records, T Results / Process Orders est X- 1 10 Staff telephones HHA, Nursing Homes / Clarify orders / etc []  - 0 Routine Transfer to another Facility (non-emergent condition) []  - 0 Routine Hospital Admission (non-emergent condition) X- 1 15 New Admissions / / Ordering NPWT Apligraf, etc. , []  - 0 Emergency Hospital Admission (emergent condition) PROCESS - Special Needs []  - 0 Pediatric / Minor Patient Management []  - 0 Isolation Patient Management []  - 0 Hearing / Language / Visual special needs []  - 0 Assessment of Community assistance (transportation, D/C planning, etc.) []  - 0 Additional assistance / Altered mentation []  - 0 Support Surface(s) Assessment (bed, cushion, seat, etc.) INTERVENTIONS - Miscellaneous []  - 0 External ear exam []  - 0 Patient Transfer (multiple staff / / Similar devices) []  - 0 Simple Staple / Suture removal (25 or  less) []  - 0 Complex Staple / Suture removal (26 or more) []  - 0 Hypo/Hyperglycemic Management (do not check if billed separately) X- 1 15 Ankle / Brachial Index (ABI) - do not check if billed separately Has the patient been seen at the hospital within the last three years: Yes Total Score: 120 Level Of  Care: New/Established - Level 4 Electronic Signature(s) Signed: 10/30/2022 5:19:28 PM By: Karie Schwalbe RN Entered By: Karie Schwalbe on 10/30/2022 17:07:44 [name], [name] ([id number]) [id number] Page 3 of 10 -------------------------------------------------------------------------------- Compression Therapy Details Patient Name: Date of Service: [name] 10/30/2022 8:00 A M Medical Record Number: [id number] Patient Account Number: [id number] Date of Birth/Sex: Treating RN: 15-Mar-1941 (81 y.o. [name] Primary Care Elayjah Chaney: Brett Fairy Other Clinician: Referring Charlane Westry: Treating Jalyssa Fleisher/Extender: [name] Weeks in Treatment: 0 Compression Therapy Performed for Wound Assessment: Wound #1 Right,Proximal,Anterior Lower Leg Performed By: Clinician Karie Schwalbe, RN Compression Type: Three Layer Post Procedure Diagnosis Same as Pre-procedure Electronic Signature(s) Signed: 10/30/2022 5:19:28 PM By: Karie Schwalbe RN Entered By: Karie Schwalbe on 10/30/2022 10:46:03 -------------------------------------------------------------------------------- Encounter Discharge Information Details Patient Name: Date of Service: [name] 10/30/2022 8:00 A M Medical Record Number: [id number] Patient Account Number: [id number] Date of Birth/Sex: Treating RN: March 04, 1941 (81 y.o. [name] Primary Care Nocole Zammit: Brett Fairy Other Clinician: Referring Makari Portman: Treating Markeith Jue/Extender: [name] Weeks in Treatment: 0 Encounter Discharge Information Items Post Procedure  Vitals Discharge Condition: Stable Temperature (F): 97.5 Ambulatory Status: Walker Pulse (bpm): 109 Discharge Destination: Home Respiratory Rate (breaths/min): 20 Transportation: Private Auto Blood Pressure (mmHg): 143/86 Accompanied By: friend Schedule Follow-up Appointment: Yes Clinical Summary of Care: Patient Declined Electronic Signature(s) Signed: 10/30/2022 5:19:28 PM By: Karie Schwalbe RN Entered By: Karie Schwalbe on 10/30/2022 17:09:27 -------------------------------------------------------------------------------- Lower Extremity Assessment Details Patient Name: Date of Service: [name] 10/30/2022 8:00 A M Medical Record Number: [id number] Patient Account Number: [id number] Date of Birth/Sex: Treating RN: 10-Mar-1941 (81 y.o. [name] Primary Care Trystyn Sitts: Brett Fairy Other Clinician: Referring Jan Olano: Treating [place] Breeding/Extender: [name] Weeks in Treatment: 0 Edema Assessment Assessed: [Left: No] [Right: No] [Left: Edema] Franne Forts: :] Calf Brusca, [name] ([id number]) [id number] Page 4 of 10 Left: Right: Point of Measurement: 36 cm From Medial Instep 45 cm 46 cm Ankle Left: Right: Point of Measurement: 10 cm From Medial Instep 23 cm 23 cm Knee To Floor Left: Right: From Medial Instep 44 cm 44 cm Vascular Assessment Pulses: Dorsalis Pedis Palpable: [Right:Yes] Posterior Tibial Palpable: [Right:Yes] Doppler Audible: [Right:Yes] Blood Pressure: Brachial: [Right:143] Ankle: [Right:Dorsalis Pedis: 156 1.09] Electronic Signature(s) Signed: 10/30/2022 5:19:28 PM By: Karie Schwalbe RN Entered By: Karie Schwalbe on 10/30/2022 17:05:36 -------------------------------------------------------------------------------- Multi Wound Chart Details Patient Name: Date of Service: [name] 10/30/2022 8:00 A M Medical Record Number: [id number] Patient Account Number: [id number] Date  of Birth/Sex: Treating RN: 26-Jun-1941 (81 y.o. F) Primary Care Disaya Walt: Brett Fairy Other Clinician: Referring Nishka Heide: Treating Raynold Blankenbaker/Extender: [name] Weeks in Treatment: 0 Vital Signs Height(in): 64 Pulse(bpm): 109 Weight(lbs): 180 Blood Pressure(mmHg): 143/86 Body Mass Index(BMI): 30.9 Temperature(F): 97.5 Respiratory Rate(breaths/min): 20 [1:Photos:] [N/A:N/A] Right, Proximal, Anterior Lower Leg Right, Distal, Anterior Lower Leg N/A Wound Location: Gradually Appeared Gradually Appeared N/A Wounding Event: Venous Leg Ulcer Venous Leg Ulcer N/A Primary Etiology: 09/30/2022 09/30/2022 N/A Date Acquired: 0 0 N/A Weeks of Treatment: Open Open N/A Wound Status: No No N/A Wound Recurrence: Yes Yes N/A Clustered Wound: 9x5.5x0.1 6.5x9x0.1 N/A Measurements L x W x D (cm) 38.877 45.946 N/A A (cm) : rea 3.888 4.595 N/A Volume (cm) : [name] ([id number]) [id number] Page 5 of 10 Full Thickness Without Exposed Full Thickness Without Exposed N/A Classification: Support Structures Support Structures Medium Medium N/A Exudate A mount: Serosanguineous Serosanguineous N/A Exudate Type: red, brown  red, brown N/A Exudate Color: Large (67-100%) Medium (34-66%) N/A Granulation A mount: Red, Pink Red N/A Granulation Quality: Small (1-33%) Medium (34-66%) N/A Necrotic A mount: Eschar, Adherent Slough Eschar, Adherent Slough N/A Necrotic Tissue: Fat Layer (Subcutaneous Tissue): Yes Fat Layer (Subcutaneous Tissue): Yes N/A Exposed Structures: Fascia: No Fascia: No Tendon: No Tendon: No Muscle: No Muscle: No Joint: No Joint: No Bone: No Bone: No None None N/A Epithelialization: Debridement - Excisional Debridement - Excisional N/A Debridement: Pre-procedure Verification/Time Out 09:20 09:20 N/A Taken: Lidocaine 4% Topical Solution Lidocaine 4% Topical Solution N/A Pain Control: Subcutaneous,  Slough Subcutaneous, Slough N/A Tissue Debrided: Skin/Subcutaneous Tissue Skin/Subcutaneous Tissue N/A Level: 49.5 58.5 N/A Debridement A (sq cm): rea Curette Curette N/A Instrument: Minimum Minimum N/A Bleeding: Pressure Pressure N/A Hemostasis A chieved: 0 0 N/A Procedural Pain: 0 0 N/A Post Procedural Pain: Procedure was tolerated well Procedure was tolerated well N/A Debridement Treatment Response: 9x5.5x0.1 6.5x9x0.1 N/A Post Debridement Measurements L x W x D (cm) 3.888 4.595 N/A Post Debridement Volume: (cm) Scarring: Yes Scarring: Yes N/A Periwound Skin Texture: No Abnormalities Noted No Abnormalities Noted N/A Periwound Skin Moisture: Hemosiderin Staining: Yes Hemosiderin Staining: Yes N/A Periwound Skin Color: N/A No Abnormality N/A Temperature: Debridement Debridement N/A Procedures Performed: Treatment Notes Electronic Signature(s) Signed: 10/30/2022 9:28:19 AM By: Duanne Guess MD FACS Entered By: Duanne Guess on 10/30/2022 09:28:19 -------------------------------------------------------------------------------- Multi-Disciplinary Care Plan Details Patient Name: Date of Service: [name] 10/30/2022 8:00 A M Medical Record Number: [id number] Patient Account Number: [id number] Date of Birth/Sex: Treating RN: 24-Jun-1941 (81 y.o. [name] Primary Care Abrea Henle: Brett Fairy Other Clinician: Referring Everline Mahaffy: Treating Boston Catarino/Extender: [name] Weeks in Treatment: 0 Active Inactive Wound/Skin Impairment Nursing Diagnoses: Impaired tissue integrity Goals: Patient/caregiver will verbalize understanding of skin care regimen Date Initiated: 10/30/2022 Target Resolution Date: 01/22/2023 Goal Status: Active Interventions: Assess ulceration(s) every visit Treatment Activities: ARIZBETH, CAWTHORN ([id number]) [id number] Page 6 of 10 Skin care regimen initiated :  10/30/2022 Notes: Electronic Signature(s) Signed: 10/30/2022 5:19:28 PM By: Karie Schwalbe RN Entered By: Karie Schwalbe on 10/30/2022 17:06:14 -------------------------------------------------------------------------------- Pain Assessment Details Patient Name: Date of Service: [name] 10/30/2022 8:00 A M Medical Record Number: [id number] Patient Account Number: [id number] Date of Birth/Sex: Treating RN: [birth year] (81 y.o. [name] Primary Care Flavius Repsher: Brett Fairy Other Clinician: Referring Laramie Gelles: Treating Gwendy Boeder/Extender: [name] Weeks in Treatment: 0 Active Problems Location of Pain Severity and Description of Pain Patient Has Paino No Site Locations Pain Management and Medication Current Pain Management: Electronic Signature(s) Signed: 10/30/2022 5:19:28 PM By: Karie Schwalbe RN Entered By: Karie Schwalbe on 10/30/2022 17:05:45 -------------------------------------------------------------------------------- Patient/Caregiver Education Details Patient Name: Date of Service: [name] 12/7/2023andnbsp8:00 A M Medical Record Number: [id number] Patient Account Number: [id number] Date of Birth/Gender: Treating RN: 04-Feb-1941 (81 y.o. [name] Primary Care Physician: Brett Fairy Other Clinician: Referring Physician: Treating Physician/Extender: [name] in Treatment: 0 Education Assessment Moore, [name] ([id number]) [id number] Page 7 of 10 Education Provided To: Patient Education Topics Provided Wound/Skin Impairment: Methods: Explain/Verbal Responses: Return demonstration correctly Electronic Signature(s) Signed: 10/30/2022 5:19:28 PM By: Karie Schwalbe RN Entered By: Karie Schwalbe on 10/30/2022 17:06:24 -------------------------------------------------------------------------------- Wound Assessment Details Patient Name: Date of  Service: [name] 10/30/2022 8:00 A M Medical Record Number: [id number] Patient Account Number: [id number] Date of Birth/Sex: Treating RN: [birth year] (81 y.o. [name] Primary Care Sarika Baldini: Brett Fairy Other Clinician: Referring Jarelis Ehlert:  Treating Alyssandra Hulsebus/Extender: [name] Weeks in Treatment: 0 Wound Status Wound Number: 1 Primary Etiology: Venous Leg Ulcer Wound Location: Right, Proximal, Anterior Lower Leg Wound Status: Open Wounding Event: Gradually Appeared Date Acquired: 09/30/2022 Weeks Of Treatment: 0 Clustered Wound: Yes Photos Wound Measurements Length: (cm) 9 Width: (cm) 5.5 Depth: (cm) 0.1 Area: (cm) 38.877 Volume: (cm) 3.888 % Reduction in Area: % Reduction in Volume: Epithelialization: None Tunneling: No Undermining: No Wound Description Classification: Full Thickness Without Exposed Suppor Exudate Amount: Medium Exudate Type: Serosanguineous Exudate Color: red, brown t Structures Foul Odor After Cleansing: No Slough/Fibrino Yes Wound Bed Granulation Amount: Large (67-100%) Exposed Structure Granulation Quality: Red, Pink Fascia Exposed: No Necrotic Amount: Small (1-33%) Fat Layer (Subcutaneous Tissue) Exposed: Yes Necrotic Quality: Eschar, Adherent Slough Tendon Exposed: No Muscle Exposed: No Settlemyre, [name] ([id number]) [id number] Page 8 of 10 Joint Exposed: No Bone Exposed: No Periwound Skin Texture Texture Color No Abnormalities Noted: No No Abnormalities Noted: No Scarring: Yes Hemosiderin Staining: Yes Moisture No Abnormalities Noted: Yes Treatment Notes Wound #1 (Lower Leg) Wound Laterality: Right, Anterior, Proximal Cleanser Soap and Water Discharge Instruction: May shower and wash wound with dial antibacterial soap and water prior to dressing change. Wound Cleanser Discharge Instruction: Cleanse the wound with wound cleanser prior to applying a clean  dressing using gauze sponges, not tissue or cotton balls. Peri-Wound Care Triamcinolone 15 (g) Discharge Instruction: Use triamcinolone 15 (g) as directed Sween Lotion (Moisturizing lotion) Discharge Instruction: Apply moisturizing lotion as directed Topical Primary Dressing Sorbalgon AG Dressing, 4x4 (in/in) Discharge Instruction: Apply to wound bed as instructed Secondary Dressing Secured With Compression Wrap ThreePress (3 layer compression wrap) Discharge Instruction: Apply three layer compression as directed. Compression Stockings Add-Ons Electronic Signature(s) Signed: 10/30/2022 5:19:28 PM By: Karie Schwalbe RN Entered By: Karie Schwalbe on 10/30/2022 08:55:09 -------------------------------------------------------------------------------- Wound Assessment Details Patient Name: Date of Service: [name] 10/30/2022 8:00 A M Medical Record Number: [id number] Patient Account Number: [id number] Date of Birth/Sex: Treating RN: 1941/03/13 (81 y.o. [name] Primary Care Haydin Calandra: Brett Fairy Other Clinician: Referring Hodge Stachnik: Treating Tyger Wichman/Extender: [name] Weeks in Treatment: 0 Wound Status Wound Number: 2 Primary Etiology: Venous Leg Ulcer Wound Location: Right, Distal, Anterior Lower Leg Wound Status: Open Wounding Event: Gradually Appeared Date Acquired: 09/30/2022 Weeks Of Treatment: 0 Clustered Wound: Yes Photos Fiscus, [name] ([id number]) [id number] Page 9 of 10 Wound Measurements Length: (cm) 6.5 Width: (cm) 9 Depth: (cm) 0.1 Area: (cm) 45.946 Volume: (cm) 4.595 % Reduction in Area: % Reduction in Volume: Epithelialization: None Tunneling: No Undermining: No Wound Description Classification: Full Thickness Without Exposed Support Structures Exudate Amount: Medium Exudate Type: Serosanguineous Exudate Color: red, brown Foul Odor After Cleansing: No Slough/Fibrino  Yes Wound Bed Granulation Amount: Medium (34-66%) Exposed Structure Granulation Quality: Red Fascia Exposed: No Necrotic Amount: Medium (34-66%) Fat Layer (Subcutaneous Tissue) Exposed: Yes Necrotic Quality: Eschar, Adherent Slough Tendon Exposed: No Muscle Exposed: No Joint Exposed: No Bone Exposed: No Periwound Skin Texture Texture Color No Abnormalities Noted: No No Abnormalities Noted: No Scarring: Yes Hemosiderin Staining: Yes Moisture Temperature / Pain No Abnormalities Noted: Yes Temperature: No Abnormality Treatment Notes Wound #2 (Lower Leg) Wound Laterality: Right, Anterior, Distal Cleanser Soap and Water Discharge Instruction: May shower and wash wound with dial antibacterial soap and water prior to dressing change. Wound Cleanser Discharge Instruction: Cleanse the wound with wound cleanser prior to applying a clean dressing using gauze sponges, not tissue or cotton balls. Peri-Wound Care Triamcinolone 15 (g)  Discharge Instruction: Use triamcinolone 15 (g) as directed Sween Lotion (Moisturizing lotion) Discharge Instruction: Apply moisturizing lotion as directed Topical Primary Dressing Sorbalgon AG Dressing, 4x4 (in/in) Discharge Instruction: Apply to wound bed as instructed Secondary Dressing Secured With Compression Wrap ThreePress (3 layer compression wrap) Discharge Instruction: Apply three layer compression as directed. Tubular Net#5 Compression Stockings [name] EdwardsDDISON, [name] ([id number]) [id number] Page 10 of 10 Add-Ons Electronic Signature(s) Signed: 10/30/2022 5:19:28 PM By: Karie SchwalbeScotton, Joanne RN Entered By: Karie SchwalbeScotton, Joanne on 10/30/2022 08:56:04 -------------------------------------------------------------------------------- Vitals Details Patient Name: Date of Service: [name] RuddyN, EV ELYN 10/30/2022 8:00 A M Medical Record Number: [id number] Patient Account Number: [id number] Date of Birth/Sex: Treating RN: 10/04/1941 (81  y.o. F) Primary Care Shawndell Varas: Brett FairyEksir, Samantha Other Clinician: Referring Edras Wilford: Treating Jermany Rimel/Extender: Katheren Shamsannon, Jennifer Eksir, Samantha Weeks in Treatment: 0 Vital Signs Time Taken: 08:10 Temperature (F): 97.5 Height (in): 64 Pulse (bpm): 109 Weight (lbs): 180 Respiratory Rate (breaths/min): 20 Body Mass Index (BMI): 30.9 Blood Pressure (mmHg): 143/86 Reference Range: 80 - 120 mg / dl Electronic Signature(s) Signed: 10/30/2022 8:59:23 AM By: [place] Scrapeoss, Aisha Entered By: [place] Scrapeoss, Aisha on 10/30/2022 08:13:17

## 2022-11-06 ENCOUNTER — Encounter (HOSPITAL_BASED_OUTPATIENT_CLINIC_OR_DEPARTMENT_OTHER): Payer: Medicare Other | Admitting: Internal Medicine

## 2022-11-06 NOTE — Progress Notes (Signed)
[name] ([id number]) [id number] Page 1 of 5 Visit Report for 11/06/2022 HPI Details Patient Name: Date of Service: PA DDISO [name] [facility] 11/06/2022 10:30 A M Medical Record Number: [id number] Patient Account Number: [id number] Date of Birth/Sex: Treating RN: [birth year] (81 y.o. 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 The patient and her family elected for comfort care however somehow she managed to heal her wounds. From the electronic medical record, it looks like she was receiving home health services and being followed by her primary care provider. At the beginning of November, she saw her PCP again and had new ulcers on her right leg. Home health services were not available to assist the patient and she was referred to the wound care center for further evaluation and management. On the right anterior lower leg, she has multiple ulcers of varying degrees and depths. Some are limited to breakdown of skin and others are deeper, into the fat layer. There is slough and some eschar accumulation. She has periwound erythema and 1-2+ edema. 12/14-second visit for this woman who arrived to clinic accompanied by a neighbor. 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 She tells me that in 2021 she had bilateral lower leg wounds which took an ordinarily long period of time to healo 2 years. The current wounds have been on the right leg for about 2 months. She may or may not have been wearing a support stocking  on the right leg. She has not been using anything on the left but fortunately does not have any wounds there. She makes it clear to me that she cannot get standard over the toe stockings on herself. Her husband passed away sometime earlier this year I believe Electronic Signature(s) Signed: 11/06/2022 4:13:03 PM By: Baltazar Najjar MD Entered By: Baltazar Najjar on 11/06/2022 11:58:54 -------------------------------------------------------------------------------- Physical Exam Details Patient Name: Date of Service: [name] 11/06/2022 10:30 A M Medical Record Number: [id number] Patient Account Number: [id number] Date of Birth/Sex: Treating RN: 1941/11/22 (81 y.o. F) Primary Care Provider: Brett Fairy Other Clinician: Referring Provider: Treating Provider/Extender: Frederik Pear in Treatment: 1 Constitutional Patient is hypertensive.. Pulse regular and within target range for patient.Marland Kitchen Respirations regular, non-labored and within target range.. Temperature is normal and within the target range for the patient.Marland Kitchen Appears in no distress. Notes Wound exam; on the right anterior lower leg several areas that are open of varying degrees. None of these have particularly viable surfaces although I did not attempt mechanical debridement. I vigorously wash these off with gauze and wound cleanser. Her edema control on the right is much better controlled. There was apparently significant drainage noted by our staff. No overt infection Electronic Signature(s) Signed: 11/06/2022 4:13:03 PM By: Baltazar Najjar MD Entered By: Baltazar Najjar on 11/06/2022 12:01:53 [name], [name] ([id number]) [id number] Page 2 of 5 -------------------------------------------------------------------------------- Physician Orders Details Patient Name: Date of Service: [name] 11/06/2022 10:30 A M Medical Record Number: [id number] Patient  Account Number: [id number] Date of Birth/Sex: Treating RN: 1941/04/03 (81 y.o. Kateri Mc Primary Care Provider: Brett Fairy Other Clinician: Referring Provider: Treating Provider/Extender:  Dallie Piles Weeks in Treatment: 1 Verbal / Phone Orders: No Diagnosis Coding Follow-up Appointments ppointment in 1 week. - Dr. Lady Gary /Dr. Leanord Hawking Room 3 Return A Anesthetic (In clinic) Topical Lidocaine 4% applied to wound bed - Used in clinic Bathing/ Shower/ Hygiene May shower with protection but do not get wound dressing(s) wet. - +++Do not get compression wraps wet on right leg++++ Other Bathing/Shower/Hygiene Orders/Instructions: - May take a sponge bath Edema Control - Lymphedema / SCD / Other Moisturize legs daily. - Use lotion on left leg-STOP using the steroid cream Other Edema Control Orders/Instructions: - Elevate Legs through out the day Wound Treatment Wound #1 - Lower Leg Wound Laterality: Right, Anterior Cleanser: Soap and Water 1 x Per Week/30 Days Discharge Instructions: May shower and wash wound with dial antibacterial soap and water prior to dressing change. Cleanser: Wound Cleanser 1 x Per Week/30 Days Discharge Instructions: Cleanse the wound with wound cleanser prior to applying a clean dressing using gauze sponges, not tissue or cotton balls. Peri-Wound Care: Triamcinolone 15 (g) 1 x Per Week/30 Days Discharge Instructions: Use triamcinolone 15 (g) as directed Peri-Wound Care: Sween Lotion (Moisturizing lotion) 1 x Per Week/30 Days Discharge Instructions: Apply moisturizing lotion as directed Prim Dressing: Sorbalgon AG Dressing, 4x4 (in/in) 1 x Per Week/30 Days ary Discharge Instructions: Apply to wound bed as instructed Compression Wrap: ThreePress (3 layer compression wrap) 1 x Per Week/30 Days Discharge Instructions: Apply three layer compression as directed. Electronic Signature(s) Signed: 11/06/2022 4:13:03 PM By: Baltazar Najjar  MD Signed: 11/06/2022 4:51:25 PM By: Tommie Ard RN Entered By: Tommie Ard on 11/06/2022 11:26:56 -------------------------------------------------------------------------------- Problem List Details Patient Name: Date of Service: [name] 11/06/2022 10:30 A M Medical Record Number: [id number] Patient Account Number: [id number] Date of Birth/Sex: Treating RN: 06-02-1941 (81 y.o. F) Primary Care Provider: Brett Fairy Other Clinician: Referring Provider: Treating Provider/Extender: Frederik Pear in Treatment: 1 [place], [name] ([id number]) [id number] Page 3 of 5 Active Problems ICD-10 Encounter Code Description Active Date MDM Diagnosis L97.819 Non-pressure chronic ulcer of other part of right lower leg with unspecified 10/30/2022 No Yes severity I87.2 Venous insufficiency (chronic) (peripheral) 10/30/2022 No Yes J44.89 Other specified chronic obstructive pulmonary disease 10/30/2022 No Yes I73.9 Peripheral vascular disease, unspecified 10/30/2022 No Yes Inactive Problems Resolved Problems Electronic Signature(s) Signed: 11/06/2022 4:13:03 PM By: Baltazar Najjar MD Entered By: Baltazar Najjar on 11/06/2022 11:53:20 -------------------------------------------------------------------------------- Progress Note Details Patient Name: Date of Service: [name] 11/06/2022 10:30 A M Medical Record Number: [id number] Patient Account Number: [id number] Date of Birth/Sex: Treating RN: 1941-11-12 (81 y.o. 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 The patient and her family elected for comfort care however somehow she managed to heal her wounds. From the electronic medical record, it looks like she was receiving home health services and being followed by her primary care provider. At the beginning of November, she saw her PCP again and had new ulcers on her right leg. Home health services were not available to assist the patient and she was referred to the wound care center for further evaluation and management. On  the right anterior lower leg, she has multiple ulcers of varying degrees and depths. Some are limited to breakdown of skin and others are deeper, into the fat layer. There is slough and some eschar accumulation. She has periwound erythema and 1-2+ edema. 12/14-second visit for this woman who arrived to clinic accompanied by a neighbor. 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 She tells me that in 2021 she had bilateral lower leg wounds which took an ordinarily long period of time to healo 2 years. The current wounds have been on the right leg for about 2 months. She may or may not have been wearing a support stocking on the right leg. She has not been using anything on the left but fortunately does not have any wounds there. She makes it clear to me that she cannot get standard over the toe stockings on herself. Her husband passed away sometime earlier this year I believe Objective [name], [name] EeEVELYN ([id number]) [id number] Page 4 of 5 Constitutional Patient is hypertensive.. Pulse regular and within target range for patient.Marland Kitchen. Respirations regular, non-labored and within target range.. Temperature is normal and within the target range for the patient.Marland Kitchen. Appears in no distress. Vitals Time Taken: 10:45 AM, Height: 64 in, Weight: 180 lbs, BMI: 30.9, Temperature: 97.8 F, Pulse: 96 bpm, Respiratory Rate: 20  breaths/min, Blood Pressure: 149/86 mmHg. General Notes: Wound exam; on the right anterior lower leg several areas that are open of varying degrees. None of these have particularly viable surfaces although I did not attempt mechanical debridement. I vigorously wash these off with gauze and wound cleanser. Her edema control on the right is much better controlled. There was apparently significant drainage noted by our staff. No overt infection Integumentary (Hair, Skin) Wound #1 status is Open. Original cause of wound was Gradually Appeared. The date acquired was: 09/30/2022. The wound has been in treatment 1 weeks. The wound is located on the Right,Anterior Lower Leg. The wound measures 9.8cm length x 6.4cm width x 0.1cm depth; 49.26cm^2 area and 4.926cm^3 volume. There is Fat Layer (Subcutaneous Tissue) exposed. There is no tunneling or undermining noted. There is a medium amount of serous drainage noted. There is large (67-100%) red, pink granulation within the wound bed. There is a small (1-33%) amount of necrotic tissue within the wound bed including Eschar and Adherent Slough. The periwound skin appearance exhibited: Excoriation, Scarring, Maceration, Hemosiderin Staining. The periwound skin appearance did not exhibit: Dry/Scaly. Wound #2 status is Converted. Original cause of wound was Gradually Appeared. The date acquired was: 09/30/2022. The wound has been in treatment 1 weeks. The wound is located on the Right,Distal,Anterior Lower Leg. The wound measures 0cm length x 0cm width x 0cm depth; 0cm^2 area and 0cm^3 volume. There is Fat Layer (Subcutaneous Tissue) exposed. There is a medium amount of serosanguineous drainage noted. There is medium (34-66%) red granulation within the wound bed. There is a medium (34-66%) amount of necrotic tissue within the wound bed including Eschar and Adherent Slough. The periwound skin appearance had no abnormalities noted for moisture. The periwound skin  appearance exhibited: Scarring, Hemosiderin Staining. Periwound temperature was noted as No Abnormality. Assessment Active Problems ICD-10 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ity Venous insufficiency (chronic) (peripheral) Other specified chronic obstructive pulmonary disease Peripheral vascular disease, unspecified Procedures Wound #1 Pre-procedure diagnosis of Wound #1 is a Venous Leg Ulcer located on the Right,Anterior Lower Leg . There was a Three Layer Compression Therapy Procedure  by Tommie Ard, RN. Post procedure Diagnosis Wound #1: Same as Pre-Procedure Plan Follow-up Appointments: Return Appointment in 1 week. - Dr. Lady Gary /Dr. Leanord Hawking Room 3 Anesthetic: (In clinic) Topical Lidocaine 4% applied to wound bed - Used in clinic Bathing/ Shower/ Hygiene: May shower with protection but do not get wound dressing(s) wet. - +++Do not get compression wraps wet on right leg++++ Other Bathing/Shower/Hygiene Orders/Instructions: - May take a sponge bath Edema Control - Lymphedema / SCD / Other: Moisturize legs daily. - Use lotion on left leg-STOP using the steroid cream Other Edema Control Orders/Instructions: - Elevate Legs through out the day WOUND #1: - Lower Leg Wound Laterality: Right, Anterior Cleanser: Soap and Water 1 x Per Week/30 Days Discharge Instructions: May shower and wash wound with dial antibacterial soap and water prior to dressing change. Cleanser: Wound Cleanser 1 x Per Week/30 Days Discharge Instructions: Cleanse the wound with wound cleanser prior to applying a clean dressing using gauze sponges, not tissue or cotton balls. Peri-Wound Care: Triamcinolone 15 (g) 1 x Per Week/30 Days Discharge Instructions: Use triamcinolone 15 (g) as directed Peri-Wound Care: Sween Lotion (Moisturizing lotion) 1 x Per Week/30 Days Discharge Instructions: Apply moisturizing lotion as directed Prim Dressing: Sorbalgon AG Dressing, 4x4  (in/in) 1 x Per Week/30 Days ary Discharge Instructions: Apply to wound bed as instructed Com pression Wrap: ThreePress (3 layer compression wrap) 1 x Per Week/30 Days Discharge Instructions: Apply three layer compression as directed. [name] ([id number]) [id number] Page 5 of 5 1. I did not change the primary dressing which is silver alginate that is a to fit, ABDs under 3 layer compression 2. We applied triamcinolone to the erythema in the periwound which I think is venous stasis as well as sink around the wounds to help with the drainage 3. Still under 3 layer compression 4. We talked about external compression stockings. She seemed overwhelmed with this discussion so as we get closer to healing we will bring this up again 5. I did not see any evidence of systemic or periwound infection but she may need a PCR culture we will have to see Electronic Signature(s) Signed: 11/06/2022 4:13:03 PM By: Baltazar Najjar MD Entered By: Baltazar Najjar on 11/06/2022 12:03:06 -------------------------------------------------------------------------------- SuperBill Details Patient Name: Date of Service: [name] 11/06/2022 Medical Record Number: [id number] Patient Account Number: [id number] Date of Birth/Sex: Treating RN: [birth year] (81 y.o. F) Primary Care Provider: Brett Fairy Other Clinician: Referring Provider: Treating Provider/Extender: Frederik Pear in Treatment: 1 Diagnosis Coding ICD-10 Codes Code Description [phone] 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ity I87.2 Venous insufficiency (chronic) (peripheral) J44.89 Other specified chronic obstructive pulmonary disease I73.9 Peripheral vascular disease, unspecified Physician Procedures : CPT4 Code Description Modifier [id number] - WC PHYS LEVEL 3 - EST PT ICD-10 Diagnosis Description L97.819 Non-pressure chronic ulcer  of other part of right lower leg with unspecified severity I87.2 Venous insufficiency (chronic) (peripheral) Quantity: 1 Electronic Signature(s) Signed: 11/06/2022 4:13:03 PM By: Baltazar Najjar MD Entered By: Baltazar Najjar on 11/06/2022 12:03:22

## 2022-11-06 NOTE — Progress Notes (Addendum)
[name] ([id number]) [id number] Page 1 of 9 Visit Report for 11/06/2022 Arrival Information Details Patient Name: Date of Service: [name] 11/06/2022 10:30 A M Medical Record Number: [id number] Patient Account Number: [id number] Date of Birth/Sex: Treating RN: Sep 10, 1941 (81 y.o. F) Primary Care Jaziah Kwasnik: Brett Fairy Other Clinician: Referring Breshae Belcher: Treating Ashlynn Gunnels/Extender: Frederik Pear in Treatment: 1 Visit Information History Since Last Visit All ordered tests and consults were completed: No Patient Arrived: [name] Added or deleted any medications: No Arrival Time: 10:52 Any new allergies or adverse reactions: No Accompanied By: neighbor Had a fall or experienced change in No Transfer Assistance: None activities of daily living that may affect Patient Identification Verified: Yes risk of falls: Secondary Verification Process Completed: Yes Signs or symptoms of abuse/neglect since last visito No Hospitalized since last visit: No Implantable device outside of the clinic excluding No cellular tissue based products placed in the center since last visit: Pain Present Now: No Electronic Signature(s) Signed: 11/06/2022 4:51:25 PM By: Tommie Ard RN Previous Signature: 11/06/2022 11:12:47 AM Version By: Dayton Scrape Entered By: Tommie Ard on 11/06/2022 11:17:26 -------------------------------------------------------------------------------- Compression Therapy Details Patient Name: Date of Service: [name] 11/06/2022 10:30 A M Medical Record Number: [id number] Patient Account Number: [id number] Date of Birth/Sex: Treating RN: Apr 01, 1941 (81 y.o. Kateri Mc Primary Care Jyquan Kenley: Brett Fairy Other Clinician: Referring Ivannia Willhelm: Treating Arlis Yale/Extender: Frederik Pear in Treatment: 1 Compression Therapy Performed for Wound Assessment: Wound #1  Right,Anterior Lower Leg Performed By: Clinician Tommie Ard, RN Compression Type: Three Layer Post Procedure Diagnosis Same as Pre-procedure Electronic Signature(s) Signed: 11/06/2022 4:51:25 PM By: Tommie Ard RN Entered By: Tommie Ard on 11/06/2022 11:41:20 [name], [name] Ee ([id number]) [id number] Page 2 of 9 -------------------------------------------------------------------------------- Encounter Discharge Information Details Patient Name: Date of Service: [name] 11/06/2022 10:30 A M Medical Record Number: [id number] Patient Account Number: [id number] Date of Birth/Sex: Treating RN: November 11, 1941 (81 y.o. Kateri Mc Primary Care Darcus Edds: Brett Fairy Other Clinician: Referring  Paone: Treating Virl Coble/Extender: Frederik Pear in Treatment: 1 Encounter Discharge Information Items Discharge Condition: Stable Ambulatory Status: Walker Discharge Destination: Home Transportation: Private Auto Accompanied By: [name] Schedule Follow-up Appointment: Yes Clinical Summary of Care: Electronic Signature(s) Signed: 11/06/2022 4:51:25 PM By: Tommie Ard RN Entered By: Tommie Ard on 11/06/2022 11:44:38 -------------------------------------------------------------------------------- Lower Extremity Assessment Details Patient Name: Date of Service: [name] 11/06/2022 10:30 A M Medical Record Number: [id number] Patient Account Number: [id number] Date of Birth/Sex: Treating RN: July 02, 1941 (81 y.o. Kateri Mc Primary Care Jiselle Sheu: Brett Fairy Other Clinician: Referring Reatha Sur: Treating Levin Dagostino/Extender: Frederik Pear in Treatment: 1 Edema Assessment Assessed: [name]: No] Franne Forts: No] [Left: Edema] [Right: :] Calf Left: Right: Point of Measurement: 36 cm From Medial Instep 39 cm Ankle Left: Right: Point of Measurement: 10 cm From Medial Instep 22.5  cm Vascular Assessment Pulses: Dorsalis Pedis Palpable: [Right:Yes] Electronic Signature(s) Signed: 11/06/2022 4:51:25 PM By: Tommie Ard RN Entered By: Tommie Ard on 11/06/2022 11:19:51 Multi Wound Chart Details -------------------------------------------------------------------------------- [name] ([id number]) [id number] Page 3 of 9 Patient Name: Date of Service: [name] 11/06/2022 10:30 A M Medical Record Number: [id number] Patient Account Number: [id number] Date of Birth/Sex: Treating RN: Mar 28, 1941 (81 y.o. 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 [name]/A Primary Etiology: Asthma, Hypertension, Osteoarthritis Asthma, Hypertension, Osteoarthritis [name]/A Comorbid History: 09/30/2022 09/30/2022 [name]/A Date Acquired: 1 1 [name]/A Weeks of Treatment: Open Converted [name]/A Wound Status: No No [name]/A Wound Recurrence: Yes Yes [name]/A Clustered Wound: 9.8x6.4x0.1 0x0x0 [name]/A Measurements L x W x D (cm) 49.26 0 [name]/A A (cm) : rea 4.926 0 [name]/A Volume (cm) : -26.70% 100.00% [name]/A % Reduction in A rea: -26.70% 100.00% [name]/A % Reduction in Volume: Full Thickness Without Exposed Full Thickness Without Exposed [name]/A Classification: Support Structures Support Structures Medium Medium [name]/A Exudate A mount: Serous Serosanguineous [name]/A Exudate Type: amber red, brown [name]/A Exudate Color: Large (67-100%) Medium (34-66%) [name]/A Granulation  Amount: Red, Pink Red [name]/A Granulation Quality: Small (1-33%) Medium (34-66%) [name]/A Necrotic Amount: Eschar, Adherent Slough Eschar, Adherent Slough [name]/A Necrotic Tissue: Fat Layer (Subcutaneous Tissue): Yes Fat Layer (Subcutaneous Tissue): Yes [name]/A Exposed Structures: Fascia: No Fascia: No Tendon: No Tendon: No Muscle: No Muscle: No Joint: No Joint: No Bone: No Bone: No None None [name]/A Epithelialization: Excoriation: Yes Scarring: Yes [name]/A Periwound Skin Texture: Scarring: Yes Maceration: Yes No Abnormalities Noted [name]/A Periwound Skin Moisture: Dry/Scaly: No Hemosiderin Staining: Yes Hemosiderin Staining: Yes [name]/A Periwound Skin Color: [name]/A No Abnormality [name]/A Temperature: Compression Therapy [name]/A [name]/A Procedures Performed: Treatment Notes Wound #1 (Lower Leg) Wound Laterality: Right, Anterior Cleanser Soap and Water Discharge Instruction: May shower and wash wound with dial antibacterial soap and water prior to dressing change. Wound Cleanser Discharge Instruction: Cleanse the wound with wound cleanser prior to applying a clean dressing using gauze sponges, not tissue or cotton balls. Peri-Wound Care Triamcinolone 15 (g) Discharge Instruction: Use triamcinolone 15 (g) as directed Sween Lotion (Moisturizing lotion) Discharge Instruction: Apply moisturizing lotion as directed Freimark, [name] Ee ([id number]) [id number] Page 4 of 9 Topical Primary Dressing Sorbalgon AG Dressing, 4x4 (in/in) Discharge Instruction: Apply to wound bed as instructed Secondary Dressing Secured With Compression Wrap ThreePress (3 layer compression wrap) Discharge Instruction: Apply three layer compression as directed. Compression Stockings Add-Ons Wound #2 (Lower Leg) Wound Laterality: Right, Anterior, Distal Cleanser Peri-Wound Care Topical Primary Dressing Secondary Dressing Secured With Compression Wrap Compression Stockings Add-Ons Electronic  Signature(s) Signed: 11/06/2022 4:13:03 PM By: Baltazar Najjar MD Entered By: Baltazar Najjar on 11/06/2022 11:53:26 -------------------------------------------------------------------------------- Multi-Disciplinary Care Plan Details Patient Name: Date of Service: [name] 11/06/2022 10:30 A M Medical Record Number: [id number] Patient Account Number: [id number] Date of Birth/Sex: Treating RN: October 15, 1941 (81 y.o. Kateri Mc Primary Care Kamara Allan: Brett Fairy Other Clinician: Referring Star Resler: Treating Nikkol Pai/Extender: Frederik Pear in Treatment: 1 Active Inactive Wound/Skin Impairment Nursing Diagnoses: Impaired tissue integrity Goals: Patient/caregiver will verbalize understanding of skin care regimen Date Initiated: 10/30/2022 Target Resolution Date: 01/22/2023 Goal Status: Active Interventions: Assess ulceration(s) every visit Treatment Activities: HAVEN, PYLANT ([id number]) [phone].pdf Page 5 of 9 Skin care regimen initiated : 10/30/2022 Notes: Electronic Signature(s) Signed: 11/06/2022 4:51:25 PM By: Tommie Ard RN Entered By: Tommie Ard on 11/06/2022 11:27:02 -------------------------------------------------------------------------------- Pain Assessment Details Patient Name: Date of Service: [name] 11/06/2022 10:30 A M Medical Record Number: [id number] Patient Account Number: [id number] Date of Birth/Sex: Treating RN: 04/11/1941 (81 y.o. F) Primary Care Daine Gunther: Brett Fairy Other Clinician: Referring Christyl Osentoski: Treating Eartha Vonbehren/Extender: Herminio Heads, Algis Greenhouse  in Treatment: 1 Active Problems Location of Pain Severity and Description of Pain Patient Has Paino Yes Site Locations Rate the pain. Current Pain Level: 7 Worst Pain Level: 10 Least Pain Level: 0 Tolerable Pain Level: 3 Pain Management and Medication Current Pain Management: Electronic  Signature(s) Signed: 11/06/2022 4:51:25 PM By: Tommie Ard RN Previous Signature: 11/06/2022 11:12:47 AM Version By: [place] Scrape Entered By: Tommie Ard on 11/06/2022 11:17:54 -------------------------------------------------------------------------------- Patient/Caregiver Education Details Patient Name: Date of Service: [name] 12/14/2023andnbsp10:30 A M Medical Record Number: [id number] Patient Account Number: [id number] Date of Birth/Gender: Treating RN: [birth year] (81 y.o. Kateri Mc Primary Care Physician: Brett Fairy Other Clinician: Referring Physician: Treating Physician/Extender: Frederik Pear in Treatment: 1 Cedotal, [name] Ee ([id number]) [phone].pdf Page 6 of 9 Education Assessment Education Provided To: Patient Education Topics Provided Wound/Skin Impairment: Methods: Explain/Verbal Responses: Reinforcements needed, State content correctly Electronic Signature(s) Signed: 11/06/2022 4:51:25 PM By: Tommie Ard RN Entered By: Tommie Ard on 11/06/2022 11:27:20 -------------------------------------------------------------------------------- Wound Assessment Details Patient Name: Date of Service: [name] 11/06/2022 10:30 A M Medical Record Number: [id number] Patient Account Number: [id number] Date of Birth/Sex: Treating RN: 1941/05/03 (81 y.o. Roselee Nova, Jamie Primary Care Numa Heatwole: Brett Fairy Other Clinician: Referring Nanci Lakatos: Treating Makynzee Tigges/Extender: Frederik Pear in Treatment: 1 Wound Status Wound Number: 1 Primary Etiology: Venous Leg Ulcer Wound Location: Right, Anterior Lower Leg Wound Status: Open Wounding Event: Gradually Appeared Comorbid History: Asthma, Hypertension, Osteoarthritis Date Acquired: 09/30/2022 Weeks Of Treatment: 1 Clustered Wound: Yes Photos Wound Measurements Length: (cm) 9.8 Width: (cm) 6.4 Depth: (cm)  0.1 Area: (cm) 49.26 Volume: (cm) 4.926 % Reduction in Area: -26.7% % Reduction in Volume: -26.7% Epithelialization: None Tunneling: No Undermining: No Wound Description Classification: Full Thickness Without Exposed Suppor Exudate Amount: Medium Exudate Type: Serous Exudate Color: amber t Structures Foul Odor After Cleansing: No Slough/Fibrino Yes Wound Bed Granulation Amount: Large (67-100%) Exposed Structure Granulation Quality: Red, Pink Fascia Exposed: No Necrotic Amount: Small (1-33%) Fat Layer (Subcutaneous Tissue) Exposed: Yes Cellucci, [name] Ee ([id number]) [id number] Page 7 of 9 Necrotic Quality: Eschar, Adherent Slough Tendon Exposed: No Muscle Exposed: No Joint Exposed: No Bone Exposed: No Periwound Skin Texture Texture Color No Abnormalities Noted: No No Abnormalities Noted: No Excoriation: Yes Hemosiderin Staining: Yes Scarring: Yes Moisture No Abnormalities Noted: No Dry / Scaly: No Maceration: Yes Treatment Notes Wound #1 (Lower Leg) Wound Laterality: Right, Anterior Cleanser Soap and Water Discharge Instruction: May shower and wash wound with dial antibacterial soap and water prior to dressing change. Wound Cleanser Discharge Instruction: Cleanse the wound with wound cleanser prior to applying a clean dressing using gauze sponges, not tissue or cotton balls. Peri-Wound Care Triamcinolone 15 (g) Discharge Instruction: Use triamcinolone 15 (g) as directed Sween Lotion (Moisturizing lotion) Discharge Instruction: Apply moisturizing lotion as directed Topical Primary Dressing Sorbalgon AG Dressing, 4x4 (in/in) Discharge Instruction: Apply to wound bed as instructed Secondary Dressing Secured With Compression Wrap ThreePress (3 layer compression wrap) Discharge Instruction: Apply three layer compression as directed. Compression Stockings Add-Ons Electronic Signature(s) Signed: 11/06/2022 4:51:25 PM By: Tommie Ard  RN Entered By: Tommie Ard on 11/06/2022 11:25:00 -------------------------------------------------------------------------------- Wound Assessment Details Patient Name: Date of Service: [name] 11/06/2022 10:30 A M Medical Record Number: [id number] Patient Account Number: [id number] Date of Birth/Sex: Treating RN: 1941-11-16 (81 y.o. Kateri Mc Primary Care Kester Stimpson: Brett Fairy Other Clinician: Referring Ennis Heavner: Treating Cheray Pardi/Extender: Frederik Pear in Treatment: 1 Wound Status  Wound Number: 2 Primary Etiology: Venous Leg Ulcer Wound Location: Right, Distal, Anterior Lower Leg Wound Status: Converted Wounding Event: Gradually Appeared Comorbid History: Asthma, Hypertension, Osteoarthritis Date Acquired: 09/30/2022 [name] EdwardsPADDISON, [name] ([id number]) [id number] Page 8 of 9 Weeks Of Treatment: 1 Clustered Wound: Yes Wound Measurements Length: (cm) Width: (cm) Depth: (cm) Area: (cm) Volume: (cm) 0 % Reduction in Area: 100% 0 % Reduction in Volume: 100% 0 Epithelialization: None 0 0 Wound Description Classification: Full Thickness Without Exposed Suppor Exudate Amount: Medium Exudate Type: Serosanguineous Exudate Color: red, brown t Structures Foul Odor After Cleansing: No Slough/Fibrino Yes Wound Bed Granulation Amount: Medium (34-66%) Exposed Structure Granulation Quality: Red Fascia Exposed: No Necrotic Amount: Medium (34-66%) Fat Layer (Subcutaneous Tissue) Exposed: Yes Necrotic Quality: Eschar, Adherent Slough Tendon Exposed: No Muscle Exposed: No Joint Exposed: No Bone Exposed: No Periwound Skin Texture Texture Color No Abnormalities Noted: No No Abnormalities Noted: No Scarring: Yes Hemosiderin Staining: Yes Moisture Temperature / Pain No Abnormalities Noted: Yes Temperature: No Abnormality Treatment Notes Wound #2 (Lower Leg) Wound Laterality: Right, Anterior,  Distal Cleanser Peri-Wound Care Topical Primary Dressing Secondary Dressing Secured With Compression Wrap Compression Stockings Add-Ons Electronic Signature(s) Signed: 11/06/2022 4:51:25 PM By: Tommie ArdZochol, Jamie RN Entered By: Tommie ArdZochol, Jamie on 11/06/2022 11:23:15 -------------------------------------------------------------------------------- Vitals Details Patient Name: Date of Service: [name] RuddyN, [name] 11/06/2022 10:30 A M Medical Record Number: [id number] Patient Account Number: [id number] Date of Birth/Sex: Treating RN: [birth year] (81 y.o. F) Primary Care Toshi Ishii: Brett FairyEksir, Samantha Other Clinician: Referring Matison Nuccio: Treating Beverlie Kurihara/Extender: Frederik Pearobson, Michael Eksir, Samantha Weeks in Treatment: 1 Vital Signs [name], [name] ([id number]) [id number] Page 9 of 9 Time Taken: 10:45 Temperature (F): 97.8 Height (in): 64 Pulse (bpm): 96 Weight (lbs): 180 Respiratory Rate (breaths/min): 20 Body Mass Index (BMI): 30.9 Blood Pressure (mmHg): 149/86 Reference Range: 80 - 120 mg / dl Electronic Signature(s) Signed: 11/06/2022 4:51:25 PM By: Tommie ArdZochol, Jamie RN Previous Signature: 11/06/2022 11:12:47 AM Version By: [place] Scrapeoss, Aisha Entered By: Tommie ArdZochol, Jamie on 11/06/2022 11:17:42

## 2022-11-13 ENCOUNTER — Encounter (HOSPITAL_BASED_OUTPATIENT_CLINIC_OR_DEPARTMENT_OTHER): Payer: Medicare Other | Admitting: Internal Medicine

## 2022-11-13 ENCOUNTER — Encounter (HOSPITAL_BASED_OUTPATIENT_CLINIC_OR_DEPARTMENT_OTHER): Payer: Medicare Other | Admitting: General Surgery

## 2022-11-13 DIAGNOSIS — L97812 Non-pressure chronic ulcer of other part of right lower leg with fat layer exposed: Secondary | ICD-10-CM | POA: Diagnosis not present

## 2022-11-13 NOTE — Progress Notes (Signed)
[name] EdwardsPADDISON, [name] ([id number]) [id number] Page 1 of 7 Visit Report for 11/13/2022 Arrival Information Details Patient Name: Date of Service: [name], [name] 11/13/2022 10:45 A M Medical Record Number: [id number] Patient Account Number: [id number] Date of Birth/Sex: Treating RN: 10-11-1941 (81 y.o. [name] PhenixF) Herrington, Taylor Primary Care [name]: Brett FairyEksir, Samantha Other Clinician: Referring Tye Juarez: Treating [name]/Extender: Virgina Jockannon, Jennifer Eksir, Samantha Weeks in Treatment: 2 Visit Information History Since Last Visit All ordered tests and consults were completed: No Patient Arrived: Ambulatory Added or deleted any medications: No Arrival Time: 10:51 Any new allergies or adverse reactions: No Accompanied By: neighbor Had a fall or experienced change in No Transfer Assistance: None activities of daily living that may affect Patient Identification Verified: Yes risk of falls: Secondary Verification Process Completed: Yes Signs or symptoms of abuse/neglect since last visito No Hospitalized since last visit: No Implantable device outside of the clinic excluding No cellular tissue based products placed in the center since last visit: Has Dressing in Place as Prescribed: Yes Has Compression in Place as Prescribed: Yes Pain Present Now: No Electronic Signature(s) Signed: 11/13/2022 4:53:43 PM By: Samuella BruinHerrington, Taylor Entered By: Samuella BruinHerrington, Taylor on 11/13/2022 10:52:48 -------------------------------------------------------------------------------- Compression Therapy Details Patient Name: Date of Service: [name] RuddyN, [name] 11/13/2022 10:45 A M Medical Record Number: [id number] Patient Account Number: [id number] Date of Birth/Sex: Treating RN: 10-11-1941 (81 y.o. [name] PhenixF) Herrington, Taylor Primary Care Meaghen Vecchiarelli: Brett FairyEksir, Samantha Other Clinician: Referring Briar Witherspoon: Treating [name]/Extender: Katheren Shamsannon, Jennifer Eksir, Samantha Weeks in Treatment:  2 Compression Therapy Performed for Wound Assessment: Wound #1 Right,Anterior Lower Leg Performed By: Clinician Samuella BruinHerrington, Taylor, RN Compression Type: Three Layer Post Procedure Diagnosis Same as Pre-procedure Electronic Signature(s) Signed: 11/13/2022 4:53:43 PM By: Samuella BruinHerrington, Taylor Entered By: Samuella BruinHerrington, Taylor on 11/13/2022 11:19:02 Franssen, [name] ([id number]) [id number] Page 2 of 7 -------------------------------------------------------------------------------- Encounter Discharge Information Details Patient Name: Date of Service: [name], [name] 11/13/2022 10:45 A M Medical Record Number: [id number] Patient Account Number: [id number] Date of Birth/Sex: Treating RN: 10-11-1941 (81 y.o. [name] PhenixF) Herrington, Taylor Primary Care [name]: Brett FairyEksir, Samantha Other Clinician: Referring Tacey Dimaggio: Treating [name]/Extender: Katheren Shamsannon, Jennifer Eksir, Samantha Weeks in Treatment: 2 Encounter Discharge Information Items Post Procedure Vitals Discharge Condition: Stable Temperature (F): 98.4 Ambulatory Status: Cane Pulse (bpm): 105 Discharge Destination: Home Respiratory Rate (breaths/min): 18 Transportation: Private Auto Blood Pressure (mmHg): 174/86 Accompanied By: [name] Flingneighbor Schedule Follow-up Appointment: Yes Clinical Summary of Care: Patient Declined Electronic Signature(s) Signed: 11/13/2022 4:53:43 PM By: Samuella BruinHerrington, Taylor Entered By: Samuella BruinHerrington, Taylor on 11/13/2022 11:37:47 -------------------------------------------------------------------------------- Lower Extremity Assessment Details Patient Name: Date of Service: [name], [name] 11/13/2022 10:45 A M Medical Record Number: [id number] Patient Account Number: [id number] Date of Birth/Sex: Treating RN: 10-11-1941 (81 y.o. [name] PhenixF) Herrington, Taylor Primary Care Janecia Palau: Brett FairyEksir, Samantha Other Clinician: Referring Marcea Rojek: Treating Kalan Rinn/Extender: Katheren Shamsannon, Jennifer Eksir, Samantha Weeks in  Treatment: 2 Edema Assessment Assessed: [Left: No] [Right: No] [Left: Edema] [Right: :] Calf Left: Right: Point of Measurement: 36 cm From Medial Instep 43.4 cm Ankle Left: Right: Point of Measurement: 10 cm From Medial Instep 26.5 cm Vascular Assessment Pulses: Dorsalis Pedis Palpable: [Right:Yes] Electronic Signature(s) Signed: 11/13/2022 4:53:43 PM By: Samuella BruinHerrington, Taylor Entered By: Samuella BruinHerrington, Taylor on 11/13/2022 11:00:29 Trefz, [name] ([id number]) [id number] Page 3 of 7 -------------------------------------------------------------------------------- Multi Wound Chart Details Patient Name: Date of Service: [name], [name] 11/13/2022 10:45 A M Medical Record Number: [id number] Patient Account Number: [id number] Date of Birth/Sex: Treating RN: 10-11-1941 (81 y.o. F) Primary Care [name]: Brett FairyEksir, Samantha Other Clinician: Referring Kaelene Elliston: Treating Ajamu Maxon/Extender:  Duanne [name] Brett Fairy Weeks in Treatment: 2 Vital Signs Height(in): 64 Pulse(bpm): 105 Weight(lbs): 180 Blood Pressure(mmHg): 174/86 Body Mass Index(BMI): 30.9 Temperature(F): 98.4 Respiratory Rate(breaths/min): 18 [1:Photos:] [N/A:N/A] Right, Anterior Lower Leg N/A N/A Wound Location: Gradually Appeared N/A N/A Wounding Event: Venous Leg Ulcer N/A N/A Primary Etiology: Asthma, Hypertension, Osteoarthritis N/A N/A Comorbid History: 09/30/2022 N/A N/A Date Acquired: 2 N/A N/A Weeks of Treatment: Open N/A N/A Wound Status: No N/A N/A Wound Recurrence: Yes N/A N/A Clustered Wound: 13.5x9.5x0.1 N/A N/A Measurements L x W x D (cm) 100.727 N/A N/A A (cm) : rea 10.073 N/A N/A Volume (cm) : -159.10% N/A N/A % Reduction in A rea: -159.10% N/A N/A % Reduction in Volume: Full Thickness Without Exposed N/A N/A Classification: Support Structures Large N/A N/A Exudate A mount: Serosanguineous N/A N/A Exudate Type: red, brown N/A N/A Exudate  Color: Distinct, outline attached N/A N/A Wound Margin: Small (1-33%) N/A N/A Granulation A mount: Red, Pink N/A N/A Granulation Quality: Large (67-100%) N/A N/A Necrotic A mount: Eschar, Adherent Slough N/A N/A Necrotic Tissue: Fat Layer (Subcutaneous Tissue): Yes N/A N/A Exposed Structures: Fascia: No Tendon: No Muscle: No Joint: No Bone: No Small (1-33%) N/A N/A Epithelialization: Debridement - Excisional N/A N/A Debridement: Pre-procedure Verification/Time Out 11:15 N/A N/A Taken: Lidocaine 4% Topical Solution N/A N/A Pain Control: Subcutaneous, Slough N/A N/A Tissue Debrided: Skin/Subcutaneous Tissue N/A N/A Level: 25 N/A N/A Debridement A (sq cm): rea Curette N/A N/A Instrument: Minimum N/A N/A Bleeding: Pressure N/A N/A Hemostasis A chieved: Procedure was tolerated well N/A N/A Debridement Treatment Response: 13.5x9.5x0.1 N/A N/A Post Debridement Measurements L x W x D (cm) 10.073 N/A N/A Post Debridement Volume: (cm) Scarring: Yes N/A N/A Periwound Skin Texture: Excoriation: No Speights, Arlita ([id number]) [id number] Page 4 of 7 Maceration: No N/A N/A Periwound Skin Moisture: Dry/Scaly: No Hemosiderin Staining: Yes N/A N/A Periwound Skin Color: No Abnormality N/A N/A Temperature: Compression Therapy N/A N/A Procedures Performed: Debridement Treatment Notes Electronic Signature(s) Signed: 11/13/2022 11:31:04 AM By: Duanne Guess MD FACS Entered By: Duanne [name] on 11/13/2022 11:31:04 -------------------------------------------------------------------------------- Multi-Disciplinary Care Plan Details Patient Name: Date of Service: [name] 11/13/2022 10:45 A M Medical Record Number: [id number] Patient Account Number: [id number] Date of Birth/Sex: Treating RN: October 28, 1941 (81 y.o. [name] Primary Care Sakina Briones: Brett Fairy Other Clinician: Referring Lerone Onder: Treating  Deanna Wiater/Extender: [name] Weeks in Treatment: 2 Active Inactive Wound/Skin Impairment Nursing Diagnoses: Impaired tissue integrity Goals: Patient/caregiver will verbalize understanding of skin care regimen Date Initiated: 10/30/2022 Target Resolution Date: 01/22/2023 Goal Status: Active Interventions: Assess ulceration(s) every visit Treatment Activities: Skin care regimen initiated : 10/30/2022 Notes: Electronic Signature(s) Signed: 11/13/2022 4:53:43 PM By: Samuella Bruin Entered By: Samuella Bruin on 11/13/2022 11:05:27 -------------------------------------------------------------------------------- Pain Assessment Details Patient Name: Date of Service: [name] 11/13/2022 10:45 A M Medical Record Number: [id number] Patient Account Number: [id number] Date of Birth/Sex: Treating RN: 25-Oct-1941 (81 y.o. [name] Primary Care Jashae Wiggs: Brett Fairy Other Clinician: Referring Jshaun Abernathy: Treating Denia Mcvicar/Extender: [name] Weeks in Treatment: 2 Active Problems Pollan, [name] Ee ([id number]) [id number] Page 5 of 7 Location of Pain Severity and Description of Pain Patient Has Paino No Site Locations Rate the pain. Current Pain Level: 0 Pain Management and Medication Current Pain Management: Electronic Signature(s) Signed: 11/13/2022 4:53:43 PM By: Samuella Bruin Entered By: Samuella Bruin on 11/13/2022 10:53:12 -------------------------------------------------------------------------------- Patient/Caregiver Education Details Patient Name: Date of Service: [name] 12/21/2023andnbsp10:45 A M Medical Record  Number: [id number] Patient Account Number: [id number] Date of Birth/Gender: Treating RN: September 25, 1941 (81 y.o. [name] Primary Care Physician: Brett Fairy Other Clinician: Referring Physician: Treating Physician/Extender: [name] in Treatment: 2 Education Assessment Education Provided To: Patient Education Topics Provided Wound/Skin Impairment: Methods: Explain/Verbal Responses: Reinforcements needed, State content correctly Electronic Signature(s) Signed: 11/13/2022 4:53:43 PM By: Samuella Bruin Entered By: Samuella Bruin on 11/13/2022 11:05:37 -------------------------------------------------------------------------------- Wound Assessment Details Patient Name: Date of Service: [name] 11/13/2022 10:45 A Valerie Roys ([id number]) [id number] Page 6 of 7 Medical Record Number: [id number] Patient Account Number: [id number] Date of Birth/Sex: Treating RN: 1941-03-07 (81 y.o. [name] Primary Care Grier Czerwinski: Brett Fairy Other Clinician: Referring Alysse Rathe: Treating Anjolie Majer/Extender: [name] Weeks in Treatment: 2 Wound Status Wound Number: 1 Primary Etiology: Venous Leg Ulcer Wound Location: Right, Anterior Lower Leg Wound Status: Open Wounding Event: Gradually Appeared Comorbid History: Asthma, Hypertension, Osteoarthritis Date Acquired: 09/30/2022 Weeks Of Treatment: 2 Clustered Wound: Yes Photos Wound Measurements Length: (cm) 13.5 Width: (cm) 9.5 Depth: (cm) 0.1 Area: (cm) 100.727 Volume: (cm) 10.073 % Reduction in Area: -159.1% % Reduction in Volume: -159.1% Epithelialization: Small (1-33%) Tunneling: No Undermining: No Wound Description Classification: Full Thickness Without Exposed Support Structures Wound Margin: Distinct, outline attached Exudate Amount: Large Exudate Type: Serosanguineous Exudate Color: red, brown Foul Odor After Cleansing: No Slough/Fibrino Yes Wound Bed Granulation Amount: Small (1-33%) Exposed Structure Granulation Quality: Red, Pink Fascia Exposed: No Necrotic Amount: Large (67-100%) Fat Layer (Subcutaneous Tissue) Exposed:  Yes Necrotic Quality: Eschar, Adherent Slough Tendon Exposed: No Muscle Exposed: No Joint Exposed: No Bone Exposed: No Periwound Skin Texture Texture Color No Abnormalities Noted: No No Abnormalities Noted: No Excoriation: No Hemosiderin Staining: Yes Scarring: Yes Temperature / Pain Temperature: No Abnormality Moisture No Abnormalities Noted: No Dry / Scaly: No Maceration: No Treatment Notes Wound #1 (Lower Leg) Wound Laterality: Right, Anterior Cleanser Soap and Water Discharge Instruction: May shower and wash wound with dial antibacterial soap and water prior to dressing change. Wound Cleanser Discharge Instruction: Cleanse the wound with wound cleanser prior to applying a clean dressing using gauze sponges, not tissue or cotton balls. Peri-Wound Care Triamcinolone 15 (g) Mette, [name] Ee ([id number]) [id number] Page 7 of 7 Discharge Instruction: Use triamcinolone 15 (g) as directed Zinc Oxide Ointment 30g tube Discharge Instruction: Apply Zinc Oxide to periwound with each dressing change Sween Lotion (Moisturizing lotion) Discharge Instruction: Apply moisturizing lotion as directed Topical Primary Dressing Sorbalgon AG Dressing, 4x4 (in/in) Discharge Instruction: Apply to wound bed as instructed Secondary Dressing ABD Pad, 5x9 Discharge Instruction: Apply over primary dressing as directed. Woven Gauze Sponge, Non-Sterile 4x4 in Discharge Instruction: Apply over primary dressing as directed. Zetuvit Plus 4x8 in Discharge Instruction: Apply over primary dressing as directed. Secured With Compression Wrap ThreePress (3 layer compression wrap) Discharge Instruction: Apply three layer compression as directed. Compression Stockings Add-Ons Electronic Signature(s) Signed: 11/13/2022 4:53:43 PM By: Samuella Bruin Entered By: Samuella Bruin on 11/13/2022  11:05:15 -------------------------------------------------------------------------------- Vitals Details Patient Name: Date of Service: [name] 11/13/2022 10:45 A M Medical Record Number: [id number] Patient Account Number: [id number] Date of Birth/Sex: Treating RN: Apr 03, 1941 (81 y.o. [name] Primary Care Karrisa Didio: Brett Fairy Other Clinician: Referring Ghazi Rumpf: Treating Inri Sobieski/Extender: [name] Weeks in Treatment: 2 Vital Signs Time Taken: 10:52 Temperature (F): 98.4 Height (in): 64 Pulse (bpm): 105 Weight (lbs): 180 Respiratory Rate (breaths/min): 18 Body Mass Index (BMI): 30.9 Blood Pressure (mmHg): 174/86  Reference Range: 80 - 120 mg / dl Electronic Signature(s) Signed: 11/13/2022 4:53:43 PM By: Samuella Bruin Entered By: Samuella Bruin on 11/13/2022 10:53:05

## 2022-11-13 NOTE — Progress Notes (Signed)
[name] ([id number]) [id number] Page 1 of 9 Visit Report for 11/13/2022 Chief Complaint Document Details Patient Name: Date of Service: [name] 11/13/2022 10:45 A M Medical Record Number: [id number] Patient Account Number: [id number] Date of Birth/Sex: Treating RN: 05-04-1941 (81 y.o. F) Primary Care Provider: Brett Fairy Other Clinician: Referring Provider: Treating Provider/Extender: [name] Weeks in Treatment: 2 Information Obtained from: Patient Chief Complaint Patient presents for treatment of open ulcers due to venous insufficiency Electronic Signature(s) Signed: 11/13/2022 11:31:12 AM By: Duanne Guess MD FACS Entered By: Duanne Guess on 11/13/2022 11:31:12 -------------------------------------------------------------------------------- Debridement Details Patient Name: Date of Service: [name] 11/13/2022 10:45 A M Medical Record Number: [id number] Patient Account Number: [id number] Date of Birth/Sex: Treating RN: 05/01/1941 (81 y.o. [name] Primary Care Provider: Brett Fairy Other Clinician: Referring Provider: Treating Provider/Extender: [name] Weeks in Treatment: 2 Debridement Performed for Assessment: Wound #1 Right,Anterior Lower Leg Performed By: Physician Duanne Guess, MD Debridement Type: Debridement Severity of Tissue Pre Debridement: Fat layer exposed Level of Consciousness (Pre-procedure): Awake and Alert Pre-procedure Verification/Time Out Yes - 11:15 Taken: Start Time: 11:15 Pain Control: Lidocaine 4% T opical Solution T Area Debrided (L x W): otal 5 (cm) x 5 (cm) = 25 (cm) Tissue and other material debrided: Non-Viable, Slough, Subcutaneous, Slough Level: Skin/Subcutaneous Tissue Debridement Description: Excisional Instrument: Curette Bleeding: Minimum Hemostasis Achieved: Pressure Response to Treatment:  Procedure was tolerated well Level of Consciousness (Post- Awake and Alert procedure): Post Debridement Measurements of Total Wound Length: (cm) 13.5 Width: (cm) 9.5 Depth: (cm) 0.1 Volume: (cm) 10.073 Character of Wound/Ulcer Post Debridement: Improved Severity of Tissue Post Debridement: Fat layer exposed Post Procedure Diagnosis Same as Pre-procedure [name], [name] Ee ([id number]) [id number] Page 2 of 9 Notes scribed for Dr. Lady Gary by Samuella Bruin, RN Electronic Signature(s) Signed: 11/13/2022 12:30:32 PM By: Duanne Guess MD FACS Signed: 11/13/2022 4:53:43 PM By: Gelene Mink By: Samuella Bruin on 11/13/2022 11:17:57 -------------------------------------------------------------------------------- HPI Details Patient Name: Date of Service: [name] 11/13/2022 10:45 A M Medical Record Number: [id number] Patient Account Number: [id number] Date of Birth/Sex: Treating RN: 06-22-1941 (81 y.o. 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 The patient and her family elected for comfort care however somehow she managed to heal her wounds. From the electronic medical record, it looks like she was receiving home health services and being followed by her primary care provider. At the beginning of November, she saw her PCP again and had new ulcers on her right leg. Home health services were not available to assist the patient and she was referred to the wound care center for further evaluation and  management. On the right anterior lower leg, she has multiple ulcers of varying degrees and depths. Some are limited to breakdown of skin and others are deeper, into the fat layer. There is slough and some eschar accumulation. She has periwound erythema and 1-2+ edema. 12/14-second visit for this woman who arrived to clinic accompanied by a neighbor. 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 She tells me that in 2021 she had bilateral lower  leg wounds which took an ordinarily long period of time to healo 2 years. The current wounds have been on the right leg for about 2 months. She may or may not have been wearing a support stocking on the right leg. She has not been using anything on the left but fortunately does not have any wounds there. She makes it clear to me that she cannot get standard over the toe stockings on herself. Her husband passed away sometime earlier this year I believe 11/13/2022: All of the wounds seem to have progressed, getting larger and at this point and the fat layer is exposed in all locations. There is slough accumulation on all surfaces. Electronic Signature(s) Signed: 11/13/2022 11:46:46 AM By: Duanne Guess MD FACS Entered By: Duanne Guess on 11/13/2022 11:46:46 -------------------------------------------------------------------------------- Physical Exam Details Patient Name: Date of Service: [name] 11/13/2022 10:45 A M Medical Record Number: [id number] Patient Account Number: [id number] Date of Birth/Sex: Treating RN: 1941-11-07 (81 y.o. F) Primary Care Provider: Brett Fairy Other Clinician: Referring Provider: Treating Provider/Extender: [name] Weeks in Treatment: 2 Constitutional She is hypertensive, but asymptomatic.Marland Kitchen Slightly tachycardic, asymptomatic.. . . No acute distress. Respiratory Normal work of breathing on room air. [name] ([id number])  [id number] Page 3 of 9 Notes 11/13/2022: All of the wounds seem to have progressed, getting larger and at this point and the fat layer is exposed in all locations. There is slough accumulation on all surfaces. Electronic Signature(s) Signed: 11/13/2022 11:47:38 AM By: Duanne Guess MD FACS Entered By: Duanne Guess on 11/13/2022 11:47:38 -------------------------------------------------------------------------------- Physician Orders Details Patient Name: Date of Service: [name] 11/13/2022 10:45 A M Medical Record Number: [id number] Patient Account Number: [id number] Date of Birth/Sex: Treating RN: [birth year] (81 y.o. [name] Primary Care Provider: Brett Fairy Other Clinician: Referring Provider: Treating Provider/Extender: [name] Weeks in Treatment: 2 Verbal / Phone Orders: No Diagnosis Coding ICD-10 Coding Code Description [phone] Non-pressure chronic ulcer of other part of right lower leg with fat layer exposed I87.2 Venous insufficiency (chronic) (peripheral) J44.89 Other specified chronic obstructive pulmonary disease I73.9 Peripheral vascular disease, unspecified Follow-up Appointments ppointment in 1 week. - Dr. Lady Gary - room 3 Return A Anesthetic (In clinic) Topical Lidocaine 4% applied to wound bed - Used in clinic Bathing/ Shower/ Hygiene May shower with protection but do not get wound dressing(s) wet. - +++Do not get compression wraps wet on right leg++++ Other Bathing/Shower/Hygiene Orders/Instructions: - May take a sponge bath Edema Control - Lymphedema / SCD / Other Moisturize legs daily. - Use lotion on left leg-STOP using the steroid cream Other Edema Control Orders/Instructions: - Elevate Legs through out the day Wound Treatment Wound #1 - Lower Leg Wound Laterality: Right, Anterior Cleanser: Soap and Water 1 x Per Week/30 Days Discharge Instructions: May shower and wash  wound with dial antibacterial soap and water prior to dressing change. Cleanser: Wound Cleanser 1 x Per Week/30 Days Discharge Instructions: Cleanse the wound with wound cleanser prior to applying a clean dressing using gauze sponges, not tissue or cotton balls. Peri-Wound Care: Triamcinolone 15 (g) 1 x Per Week/30 Days Discharge Instructions: Use triamcinolone 15 (g) as directed Peri-Wound Care: Zinc Oxide Ointment 30g tube 1 x Per Week/30 Days Discharge Instructions: Apply Zinc Oxide to periwound with each dressing change Peri-Wound Care: Sween Lotion (Moisturizing lotion) 1 x Per Week/30 Days Discharge Instructions: Apply moisturizing lotion as directed Prim Dressing: Sorbalgon AG Dressing, 4x4 (in/in) 1 x Per Week/30  Days ary Discharge Instructions: Apply to wound bed as instructed Secondary Dressing: ABD Pad, 5x9 1 x Per Week/30 Days Discharge Instructions: Apply over primary dressing as directed. Secondary Dressing: Woven Gauze Sponge, Non-Sterile 4x4 in 1 x Per Week/30 Days Discharge Instructions: Apply over primary dressing as directed. [name] ([id number]) [id number] Page 4 of 9 Secondary Dressing: Zetuvit Plus 4x8 in 1 x Per Week/30 Days Discharge Instructions: Apply over primary dressing as directed. Compression Wrap: ThreePress (3 layer compression wrap) 1 x Per Week/30 Days Discharge Instructions: Apply three layer compression as directed. Patient Medications llergies: meloxicam A Notifications Medication Indication Start End 11/13/2022 lidocaine DOSE topical 4 % cream - cream topical Electronic Signature(s) Signed: 11/13/2022 12:30:32 PM By: Duanne Guess MD FACS Entered By: Duanne Guess on 11/13/2022 11:47:51 -------------------------------------------------------------------------------- Problem List Details Patient Name: Date of Service: [name] 11/13/2022 10:45 A M Medical Record Number: [id number] Patient  Account Number: [id number] Date of Birth/Sex: Treating RN: 1941-11-06 (81 y.o. F) Primary Care Provider: Brett Fairy Other Clinician: Referring Provider: Treating Provider/Extender: [name] Weeks in Treatment: 2 Active Problems ICD-10 Encounter Code Description Active Date MDM Diagnosis [phone] Non-pressure chronic ulcer of other part of right lower leg with fat layer 10/30/2022 No Yes exposed I87.2 Venous insufficiency (chronic) (peripheral) 10/30/2022 No Yes J44.89 Other specified chronic obstructive pulmonary disease 10/30/2022 No Yes I73.9 Peripheral vascular disease, unspecified 10/30/2022 No Yes Inactive Problems Resolved Problems Electronic Signature(s) Signed: 11/13/2022 11:30:56 AM By: Duanne Guess MD FACS Entered By: Duanne Guess on 11/13/2022 11:30:56 Schlack, [name] Ee ([id number]) [id number] Page 5 of 9 -------------------------------------------------------------------------------- Progress Note Details Patient Name: Date of Service: [name] 11/13/2022 10:45 A M Medical Record Number: [id number] Patient Account Number: [id number] Date of Birth/Sex: Treating RN: [birth year] (81 y.o. 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 The patient and her family elected for comfort care however somehow she managed to heal her wounds. From the  electronic medical record, it looks like she was receiving home health services and being followed by her primary care provider. At the beginning of November, she saw her PCP again and had new ulcers on her right leg. Home health services were not available to assist the patient and she was referred to the wound care center for further evaluation and management. On the right anterior lower leg, she has multiple ulcers of varying degrees and depths. Some are limited to breakdown of skin and others are deeper, into the fat layer. There is slough and some eschar accumulation. She has periwound erythema and 1-2+ edema. 12/14-second visit for this woman who arrived to clinic accompanied by a neighbor. 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 She tells me that in 2021 she had bilateral lower leg wounds which took an ordinarily long period of time to healo 2 years. The current wounds have been on the right leg for about 2 months. She may or may not have been wearing a support stocking on the right leg. She has not been using anything  on the left but fortunately does not have any wounds there. She makes it clear to me that she cannot get standard over the toe stockings on herself. Her husband passed away sometime earlier this year I believe 11/13/2022: All of the wounds seem to have progressed, getting larger and at this point and the fat layer is exposed in all locations. There is slough accumulation on all surfaces. Patient History Information obtained from Patient. Social History Never smoker, Marital Status - Widowed, Alcohol Use - Never, Drug Use - No History, Caffeine Use - Daily. Medical History Respiratory Patient has history of Asthma Cardiovascular Patient has history of Hypertension Musculoskeletal Patient has history of Osteoarthritis Hospitalization/Surgery History - Bilateral Cataract Extraction; Bone Graft-jaw; Arm Surgery (d/t dog bite). Medical A Surgical  History Notes nd Eyes Hx: Cataracts Ear/Nose/Mouth/Throat Hx: wearing Bilateral hearing aids Genitourinary Chronic Renal Impairment Stage 3 Psychiatric Hx: Depression Objective Constitutional She is hypertensive, but asymptomatic.Marland Kitchen Slightly tachycardic, asymptomatic.Marland Kitchen No acute distress. [name] ([id number]) [id number] Page 6 of 9 Vitals Time Taken: 10:52 AM, Height: 64 in, Weight: 180 lbs, BMI: 30.9, Temperature: 98.4 F, Pulse: 105 bpm, Respiratory Rate: 18 breaths/min, Blood Pressure: 174/86 mmHg. Respiratory Normal work of breathing on room air. General Notes: 11/13/2022: All of the wounds seem to have progressed, getting larger and at this point and the fat layer is exposed in all locations. There is slough accumulation on all surfaces. Integumentary (Hair, Skin) Wound #1 status is Open. Original cause of wound was Gradually Appeared. The date acquired was: 09/30/2022. The wound has been in treatment 2 weeks. The wound is located on the Right,Anterior Lower Leg. The wound measures 13.5cm length x 9.5cm width x 0.1cm depth; 100.727cm^2 area and 10.073cm^3 volume. There is Fat Layer (Subcutaneous Tissue) exposed. There is no tunneling or undermining noted. There is a large amount of serosanguineous drainage noted. The wound margin is distinct with the outline attached to the wound base. There is small (1-33%) red, pink granulation within the wound bed. There is a large (67-100%) amount of necrotic tissue within the wound bed including Eschar and Adherent Slough. The periwound skin appearance exhibited: Scarring, Hemosiderin Staining. The periwound skin appearance did not exhibit: Excoriation, Dry/Scaly, Maceration. Periwound temperature was noted as No Abnormality. Assessment Active Problems ICD-10 Non-pressure chronic ulcer of other part of right lower leg with fat layer exposed Venous insufficiency (chronic) (peripheral) Other specified chronic  obstructive pulmonary disease Peripheral vascular disease, unspecified Procedures Wound #1 Pre-procedure diagnosis of Wound #1 is a Venous Leg Ulcer located on the Right,Anterior Lower Leg .Severity of Tissue Pre Debridement is: Fat layer exposed. There was a Excisional Skin/Subcutaneous Tissue Debridement with a total area of 25 sq cm performed by Duanne Guess, MD. With the following instrument(s): Curette to remove Non-Viable tissue/material. Material removed includes Subcutaneous Tissue and Slough and after achieving pain control using Lidocaine 4% T opical Solution. No specimens were taken. A time out was conducted at 11:15, prior to the start of the procedure. A Minimum amount of bleeding was controlled with Pressure. The procedure was tolerated well. Post Debridement Measurements: 13.5cm length x 9.5cm width x 0.1cm depth; 10.073cm^3 volume. Character of Wound/Ulcer Post Debridement is improved. Severity of Tissue Post Debridement is: Fat layer exposed. Post procedure Diagnosis Wound #1: Same as Pre-Procedure General Notes: scribed for Dr. Lady Gary by Samuella Bruin, RN. Pre-procedure diagnosis of Wound #1 is a Venous Leg Ulcer located on the Right,Anterior Lower Leg . There was a Three Layer Compression Therapy Procedure by Ander Slade,  Ladona Ridgelaylor, RN. Post procedure Diagnosis Wound #1: Same as Pre-Procedure Plan Follow-up Appointments: Return Appointment in 1 week. - Dr. Lady Garyannon - room 3 Anesthetic: (In clinic) Topical Lidocaine 4% applied to wound bed - Used in clinic Bathing/ Shower/ Hygiene: May shower with protection but do not get wound dressing(s) wet. - +++Do not get compression wraps wet on right leg++++ Other Bathing/Shower/Hygiene Orders/Instructions: - May take a sponge bath Edema Control - Lymphedema / SCD / Other: Moisturize legs daily. - Use lotion on left leg-STOP using the steroid cream Other Edema Control Orders/Instructions: - Elevate Legs through out the day The  following medication(s) was prescribed: lidocaine topical 4 % cream cream topical was prescribed at facility WOUND #1: - Lower Leg Wound Laterality: Right, Anterior Cleanser: Soap and Water 1 x Per Week/30 Days Discharge Instructions: May shower and wash wound with dial antibacterial soap and water prior to dressing change. Cleanser: Wound Cleanser 1 x Per Week/30 Days Discharge Instructions: Cleanse the wound with wound cleanser prior to applying a clean dressing using gauze sponges, not tissue or cotton balls. Peri-Wound Care: Triamcinolone 15 (g) 1 x Per Week/30 Days Discharge Instructions: Use triamcinolone 15 (g) as directed Peri-Wound Care: Zinc Oxide Ointment 30g tube 1 x Per Week/30 Days Discharge Instructions: Apply Zinc Oxide to periwound with each dressing change Peri-Wound Care: Sween Lotion (Moisturizing lotion) 1 x Per Week/30 Days Discharge Instructions: Apply moisturizing lotion as directed Prim Dressing: Sorbalgon AG Dressing, 4x4 (in/in) 1 x Per Week/30 Days ary Discharge Instructions: Apply to wound bed as instructed Secondary Dressing: ABD Pad, 5x9 1 x Per Week/30 Days Garceau, [name] EeEVELYN ([id number]) [id number] Page 7 of 9 Discharge Instructions: Apply over primary dressing as directed. Secondary Dressing: Woven Gauze Sponge, Non-Sterile 4x4 in 1 x Per Week/30 Days Discharge Instructions: Apply over primary dressing as directed. Secondary Dressing: Zetuvit Plus 4x8 in 1 x Per Week/30 Days Discharge Instructions: Apply over primary dressing as directed. Compression Wrap: ThreePress (3 layer compression wrap) 1 x Per Week/30 Days Discharge Instructions: Apply three layer compression as directed. 11/13/2022: All of the wounds seem to have progressed, getting larger and at this point and the fat layer is exposed in all locations. There is slough accumulation on all surfaces. I used a curette to debride slough and nonviable subcutaneous tissue from  the wound surfaces. We will continue silver alginate and 3 layer compression. Follow-up in 1 week. Electronic Signature(s) Signed: 11/13/2022 11:48:46 AM By: Duanne Guessannon, Amy Gothard MD FACS Entered By: Duanne Guessannon, Adrina Armijo on 11/13/2022 11:48:46 -------------------------------------------------------------------------------- HxROS Details Patient Name: Date of Service: [name], EV ELYN 11/13/2022 10:45 A M Medical Record Number: [id number] Patient Account Number: [id number] Date of Birth/Sex: Treating RN: 03-Jul-1941 (81 y.o. F) Primary Care Provider: Brett FairyEksir, Samantha Other Clinician: Referring Provider: Treating Provider/Extender: Virgina Jockannon, Gavin Telford Eksir, Samantha Weeks in Treatment: 2 Information Obtained From Patient Eyes Medical History: Past Medical History Notes: Hx: Cataracts Ear/Nose/Mouth/Throat Medical History: Past Medical History Notes: Hx: wearing Bilateral hearing aids Respiratory Medical History: Positive for: Asthma Cardiovascular Medical History: Positive for: Hypertension Genitourinary Medical History: Past Medical History Notes: Chronic Renal Impairment Stage 3 Musculoskeletal Medical History: Positive for: Osteoarthritis Psychiatric Medical History: Past Medical History NotesYevette [name]: Lachney, Jahari ([id number]) [id number] Page 8 of 9 Hx: Depression Immunizations Pneumococcal Vaccine: Received Pneumococcal Vaccination: Yes Received Pneumococcal Vaccination On or After 60th Birthday: Yes Implantable Devices None Hospitalization / Surgery History Type of Hospitalization/Surgery Bilateral Cataract Extraction; Bone Graft-jaw; Arm Surgery (d/t dog bite) Family and Social History Never smoker; Marital Status - Widowed;  Alcohol Use: Never; Drug Use: No History; Caffeine Use: Daily; Financial Concerns: No; Food, Clothing or Shelter Needs: No; Support System Lacking: No; Transportation Concerns: No Electronic Signature(s) Signed:  11/13/2022 12:30:32 PM By: Duanne Guess MD FACS Entered By: Duanne Guess on 11/13/2022 11:46:57 -------------------------------------------------------------------------------- SuperBill Details Patient Name: Date of Service: [name] 11/13/2022 Medical Record Number: [id number] Patient Account Number: [id number] Date of Birth/Sex: Treating RN: 09-07-1941 (81 y.o. F) Primary Care Provider: Brett Fairy Other Clinician: Referring Provider: Treating Provider/Extender: [name] Weeks in Treatment: 2 Diagnosis Coding ICD-10 Codes Code Description [phone] Non-pressure chronic ulcer of other part of right lower leg with fat layer exposed I87.2 Venous insufficiency (chronic) (peripheral) J44.89 Other specified chronic obstructive pulmonary disease I73.9 Peripheral vascular disease, unspecified Facility Procedures : CPT4 Code: [id number] Description: 11042 - DEB SUBQ TISSUE 20 SQ CM/< ICD-10 Diagnosis Description L97.812 Non-pressure chronic ulcer of other part of right lower leg with fat layer expo Modifier: sed Quantity: 1 : CPT4 Code: [id number] Description: 11045 - DEB SUBQ TISS EA ADDL 20CM ICD-10 Diagnosis Description L97.812 Non-pressure chronic ulcer of other part of right lower leg with fat layer expo Modifier: sed Quantity: 1 Physician Procedures : CPT4 Code Description Modifier [id number] 99213 - WC PHYS LEVEL 3 - EST PT 25 ICD-10 Diagnosis Description L97.812 Non-pressure chronic ulcer of other part of right lower leg with fat layer exposed I87.2 Venous insufficiency (chronic) (peripheral) I73.9  Peripheral vascular disease, unspecified J44.89 Other specified chronic obstructive pulmonary disease Quantity: 1 : [id number] 11042 - WC PHYS SUBQ TISS 20 SQ CM Steib, Adilyn ([id number]) [id number] ICD-10 Diagnosis Description L97.812 Non-pressure chronic ulcer of other part of right lower leg with fat layer  exposed Quantity: 1 227.pdf Page 9 of 9 : [id number] 11045 - WC PHYS SUBQ TISS EA ADDL 20 CM 1 ICD-10 Diagnosis Description L97.812 Non-pressure chronic ulcer of other part of right lower leg with fat layer exposed Quantity: Electronic Signature(s) Signed: 11/13/2022 11:49:05 AM By: Duanne Guess MD FACS Entered By: Duanne Guess on 11/13/2022 11:49:04

## 2022-11-20 ENCOUNTER — Encounter (HOSPITAL_BASED_OUTPATIENT_CLINIC_OR_DEPARTMENT_OTHER): Payer: Medicare Other | Admitting: General Surgery

## 2022-11-20 DIAGNOSIS — L97812 Non-pressure chronic ulcer of other part of right lower leg with fat layer exposed: Secondary | ICD-10-CM | POA: Diagnosis not present

## 2022-11-21 NOTE — Progress Notes (Signed)
[name] [name] ([id number]) [id number] Page 1 of 9 Visit Report for 11/20/2022 Chief Complaint Document Details Patient Name: Date of Service: PA DDISO [name] 11/20/2022 12:30 PM Medical Record Number: [id number] Patient Account Number: [id number] Date of Birth/Sex: Treating RN: [birth year] (81 y.o. F) Primary Care Provider: Terrill Mohr Other Clinician: Referring Provider: Treating Provider/Extender: [name] [name] in Treatment: 3 Information Obtained from: Patient Chief Complaint Patient presents for treatment of open ulcers due to venous insufficiency Electronic Signature(s) Signed: 11/20/2022 1:32:19 PM By: [name] [name] Entered By: [name] [name] on 11/20/2022 13:32:19 -------------------------------------------------------------------------------- Debridement Details Patient Name: Date of Service: PA DDISO [name] [name] 11/20/2022 12:30 PM Medical Record Number: [id number] Patient Account Number: [id number] Date of Birth/Sex: Treating RN: 04-19-1941 (81 y.o. [name] [name] Primary Care Provider: Terrill Mohr Other Clinician: Referring Provider: Treating Provider/Extender: [name] [name] in Treatment: 3 Debridement Performed for Assessment: Wound #1 Right,Anterior Lower Leg Performed By: Physician [name] Maudlin, MD Debridement Type: Debridement Severity of Tissue Pre Debridement: Fat layer exposed Level of Consciousness (Pre-procedure): Awake and Alert Pre-procedure Verification/Time Out Yes - 13:25 Taken: Start Time: 13:25 Pain Control: Lidocaine 5% topical ointment T Area Debrided (L x W): otal 16 (cm) x 9.2 (cm) = 147.2 (cm) Tissue and other material debrided: Non-Viable, Slough, Slough Level: Non-Viable Tissue Debridement Description: Selective/Open Wound Instrument: Curette Bleeding: Minimum Hemostasis Achieved: Pressure End Time: 13:26 Procedural Pain:  0 Post Procedural Pain: 0 Response to Treatment: Procedure was tolerated well Level of Consciousness (Post- Awake and Alert procedure): Post Debridement Measurements of Total Wound Length: (cm) 16 Width: (cm) 9.2 Depth: (cm) 0.1 Volume: (cm) 11.561 Character of Wound/Ulcer Post Debridement: Improved Severity of Tissue Post Debridement: Fat layer exposed [name] [name] ([id number].pdf Page 2 of 9 Post Procedure Diagnosis Same as Pre-procedure Notes Scribed for Dr. Celine [name] by [name] [name] Electronic Signature(s) Signed: 11/20/2022 4:02:50 PM By: [name] [name] Signed: 11/21/2022 7:30:54 AM By: [name] Catholic RN Entered By: [name] [name] on 11/20/2022 13:26:20 -------------------------------------------------------------------------------- HPI Details Patient Name: Date of Service: PA DDISO [name] [name] 11/20/2022 12:30 PM Medical Record Number: [id number] Patient Account Number: [id number] Date of Birth/Sex: Treating RN: 01/27/1941 (81 y.o. 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 The patient and her family elected for comfort care however somehow she managed to heal her wounds. From the electronic medical record, it looks like she was receiving home health services and being followed by her primary care provider. At the beginning of November, she saw her PCP again and had new ulcers on her right leg. Home health services were not available to assist the patient and she was referred to the wound care  center for further evaluation and management. On the right anterior lower leg, she has multiple ulcers of varying degrees and depths. Some are limited to breakdown of skin and others are deeper, into the fat layer. There is slough and some eschar accumulation. She has periwound erythema and 1-2+ edema. 12/14-second visit for this woman who arrived to clinic accompanied by a neighbor. 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 She tells me that in  2021 she had bilateral lower leg wounds which took an ordinarily long period of time to healo 2 years. The current wounds have been on the right leg for about 2 months. She may or may not have been wearing a support stocking on the right leg. She has not been using anything on the left but fortunately does not have any wounds there. She makes it clear to me that she cannot get standard over the toe stockings on herself. Her husband passed away sometime earlier this year I believe 11/13/2022: All of the wounds seem to have progressed, getting larger and at this point and the fat layer is exposed in all locations. There is slough accumulation on all surfaces. 11/20/2022: Her wounds continue to worsen. They have expanded and there is thick slough on all of the wound surfaces. She is experiencing more pain. Electronic Signature(s) Signed: 11/20/2022 1:32:53 PM By: [name] Guess MD [name] Entered By: [name] [name] on 11/20/2022 13:32:53 -------------------------------------------------------------------------------- Physical Exam Details Patient Name: Date of Service: PA DDISO [name] [name] 11/20/2022 12:30 PM Medical Record Number: [id number] Patient Account Number: [id number] Date of Birth/Sex: Treating RN: [birth year] (81 y.o. F) Primary Care Provider: Brett [name] Other Clinician: Referring Provider: Treating Provider/Extender: [name] [name] in Treatment: 3 [name] [name] Ee ([id number])  [id number] Page 3 of 9 Constitutional She is hypertensive, but asymptomatic.. . . . No acute distress. Respiratory Normal work of breathing on room air. Notes 11/20/2022: Her wounds continue to worsen. They have expanded and there is thick slough on all of the wound surfaces. She is experiencing more pain. Electronic Signature(s) Signed: 11/20/2022 1:33:24 PM By: [name] Guess MD [name] Entered By: [name] [name] on 11/20/2022 13:33:24 -------------------------------------------------------------------------------- Physician Orders Details Patient Name: Date of Service: PA DDISO [name] [name] 11/20/2022 12:30 PM Medical Record Number: [id number] Patient Account Number: [id number] Date of Birth/Sex: Treating RN: May 22, 1941 (81 y.o. [name] Primary Care Provider: Brett [name] Other Clinician: Referring Provider: Treating Provider/Extender: [name] [name] in Treatment: 3 Verbal / Phone Orders: No Diagnosis Coding ICD-10 Coding Code Description [phone] Non-pressure chronic ulcer of other part of right lower leg with fat layer exposed I87.2 Venous insufficiency (chronic) (peripheral) J44.89 Other specified chronic obstructive pulmonary disease I73.9 Peripheral vascular disease, unspecified Follow-up Appointments ppointment in 1 week. - Dr. Lady Gary - Room 3 Return A Anesthetic (In clinic) Topical Lidocaine 4% applied to wound bed - Used in clinic Bathing/ Shower/ Hygiene Other Bathing/Shower/Hygiene Orders/Instructions: - May take a sponge bath Edema Control - Lymphedema / SCD / Other Moisturize legs daily. - Use lotion on left leg-STOP using the steroid cream Other Edema Control Orders/Instructions: - Elevate Legs through out the day Additional Orders / Instructions Other: - Pick up prescription for Augmentin Wound Treatment Wound #1 - Lower Leg Wound Laterality: Right, Anterior Cleanser: Soap and Water 1 x Per  Week/30 Days Discharge Instructions: May shower and wash wound with dial antibacterial soap and water prior to dressing change. Cleanser: Wound Cleanser 1 x Per Week/30 Days Discharge Instructions: Cleanse the wound with wound cleanser prior to applying a clean dressing using gauze sponges, not tissue or cotton balls. Peri-Wound Care: Triamcinolone 15 (g) 1 x Per Week/30 Days Discharge Instructions: Use triamcinolone 15 (g) as directed Peri-Wound Care: Sween Lotion (Moisturizing lotion) 1 x Per Week/30 Days Discharge Instructions: Apply moisturizing lotion as directed Prim Dressing: IODOFLEX 0.9% Cadexomer Iodine Pad 4x6 cm 1 x Per Week/30 Days ary Discharge Instructions: Apply to wound bed as instructed Secondary  Dressing: Woven Gauze Sponge, Non-Sterile 4x4 in 1 x Per Week/30 Days Face, [name] [name] ([id number]) [phone].pdf Page 4 of 9 Discharge Instructions: Apply over primary dressing as directed. Secondary Dressing: Zetuvit Plus 4x8 in 1 x Per Week/30 Days Discharge Instructions: Apply over primary dressing as directed. Compression Wrap: ThreePress (3 layer compression wrap) 1 x Per Week/30 Days Discharge Instructions: Apply three layer compression as directed. Laboratory naerobe culture (MICRO) - Right Anterior Lower Leg - (ICD10 [phone] - Non-pressure chronic Bacteria identified in Unspecified specimen by A ulcer of other part of right lower leg with fat layer exposed) LOINC Code: [id number] Convenience Name: Anaerobic culture Patient Medications llergies: meloxicam A Notifications Medication Indication Start End 11/20/2022 amoxicillin-pot clavulanate DOSE oral 875 mg-125 mg tablet - 1 tab p.o. twice daily x 10 days Electronic Signature(s) Signed: 11/21/2022 6:54:01 AM By: [name] [name] Signed: 11/21/2022 7:30:54 AM By: [name] Catholic RN Previous Signature: 11/20/2022 4:02:50 PM Version By: [name] [name] Previous Signature:  11/20/2022 1:34:14 PM Version By: [name] [name] Entered By: [name] [name] on 11/20/2022 17:29:00 -------------------------------------------------------------------------------- Problem List Details Patient Name: Date of Service: PA DDISO [name] [name] 11/20/2022 12:30 PM Medical Record Number: [id number] Patient Account Number: [id number] Date of Birth/Sex: Treating RN: 1941-04-04 (81 y.o. F) Primary Care Provider: Terrill Mohr Other Clinician: Referring Provider: Treating Provider/Extender: [name] [name] in Treatment: 3 Active Problems ICD-10 Encounter Code Description Active Date MDM Diagnosis [phone] Non-pressure chronic ulcer of other part of right lower leg with fat layer 10/30/2022 No Yes exposed I87.2 Venous insufficiency (chronic) (peripheral) 10/30/2022 No Yes J44.89 Other specified chronic obstructive pulmonary disease 10/30/2022 No Yes I73.9 Peripheral vascular disease, unspecified 10/30/2022 No Yes Inactive Problems [name] [name] ([id number]) [id number] Page 5 of 9 Resolved Problems Electronic Signature(s) Signed: 11/20/2022 1:32:07 PM By: [name] [name] Entered By: [name] [name] on 11/20/2022 13:32:07 -------------------------------------------------------------------------------- Progress Note Details Patient Name: Date of Service: PA DDISO [name] [name] 11/20/2022 12:30 PM Medical Record Number: [id number] Patient Account Number: [id number] Date of Birth/Sex: Treating RN: 18-Dec-1940 (81 y.o. 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 The patient and her family elected for comfort care however somehow she managed to heal her wounds. From the electronic medical record, it looks like she was receiving home health services and being followed by her primary care provider. At the beginning of November, she saw her PCP again and had new ulcers on her right leg. Home health services were not available to assist the patient and she was referred to the wound care center for further evaluation and management. On the right anterior lower leg, she has multiple ulcers of varying degrees and depths. Some are limited to breakdown of skin and others are deeper, into the fat layer. There is slough and some eschar accumulation. She has periwound erythema and 1-2+ edema. 12/14-second visit for this woman who arrived to clinic accompanied by a neighbor. 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 She tells me that in 2021 she had bilateral lower  leg wounds which took an ordinarily long period of time to healo 2 years. The current wounds have been on the right leg for about 2 months. She may or may not have been wearing a support stocking on the right leg. She has not been using anything on the left but fortunately does not have any wounds there. She makes it clear to me that she cannot get standard over the toe stockings on herself. Her husband passed away sometime earlier this year I believe 11/13/2022: All of the wounds seem to have progressed, getting larger and at this point and the fat layer is exposed in all locations. There is slough accumulation on all surfaces. 11/20/2022: Her wounds continue to worsen. They have expanded and there is thick slough on all of the wound surfaces. She is experiencing more pain. Patient  History Information obtained from Patient. Social History Never smoker, Marital Status - Widowed, Alcohol Use - Never, Drug Use - No History, Caffeine Use - Daily. Medical History Respiratory Patient has history of Asthma Cardiovascular Patient has history of Hypertension Musculoskeletal Patient has history of Osteoarthritis Hospitalization/Surgery History - Bilateral Cataract Extraction; Bone Graft-jaw; Arm Surgery (d/t dog bite). Medical A Surgical History Notes nd Eyes Hx: Cataracts Ear/Nose/Mouth/Throat Hx: wearing Bilateral hearing aids Genitourinary Chronic Renal Impairment Stage 3 Psychiatric Hx: Depression [name] [name] ([id number]) [id number] Page 6 of 9 Objective Constitutional She is hypertensive, but asymptomatic.Marland Kitchen No acute distress. Vitals Time Taken: 12:38 PM, Height: 64 in, Weight: 180 lbs, BMI: 30.9, Temperature: 98.2 F, Pulse: 99 bpm, Respiratory Rate: 16 breaths/min, Blood Pressure: 163/100 mmHg. Respiratory Normal work of breathing on room air. General Notes: 11/20/2022: Her wounds continue to worsen. They have expanded and there is thick slough on all of the wound surfaces. She is experiencing more pain. Integumentary (Hair, Skin) Wound #1 status is Open. Original cause of wound was Gradually Appeared. The date acquired was: 09/30/2022. The wound has been in treatment 3 [name]. The wound is located on the Right,Anterior Lower Leg. The wound measures 16cm length x 9.2cm width x 0.1cm depth; 115.611cm^2 area and 11.561cm^3 volume. There is Fat Layer (Subcutaneous Tissue) exposed. There is no tunneling or undermining noted. There is a large amount of serosanguineous drainage noted. The wound margin is distinct with the outline attached to the wound base. There is small (1-33%) red, hyper - granulation within the wound bed. There is a large (67-100%) amount of necrotic tissue within the wound bed including Eschar and Adherent Slough.  The periwound skin appearance exhibited: Scarring, Hemosiderin Staining. The periwound skin appearance did not exhibit: Excoriation, Dry/Scaly, Maceration. Periwound temperature was noted as No Abnormality. General Notes: Clustered wound Assessment Active Problems ICD-10 Non-pressure chronic ulcer of other part of right lower leg with fat layer exposed Venous insufficiency (chronic) (peripheral) Other specified chronic obstructive pulmonary disease Peripheral vascular disease, unspecified Procedures Wound #1 Pre-procedure diagnosis of Wound #1 is a Venous Leg Ulcer located on the Right,Anterior Lower Leg .Severity of Tissue Pre Debridement is: Fat layer exposed. There was a Selective/Open Wound Non-Viable Tissue Debridement with a total area of 147.2 sq cm performed by [name] Maudlin, MD. With the following instrument(s): Curette to remove Non-Viable tissue/material. Material removed includes [facility] after achieving pain control using Lidocaine 5% topical ointment. No specimens were taken. A time out was conducted at 13:25, prior to the start of the procedure. A Minimum amount of bleeding was controlled with Pressure. The procedure was tolerated well with a pain level of 0 throughout and a  pain level of 0 following the procedure. Post Debridement Measurements: 16cm length x 9.2cm width x 0.1cm depth; 11.561cm^3 volume. Character of Wound/Ulcer Post Debridement is improved. Severity of Tissue Post Debridement is: Fat layer exposed. Post procedure Diagnosis Wound #1: Same as Pre-Procedure General Notes: Scribed for Dr. Celine [name] by [name] [name]. Plan Follow-up Appointments: Return Appointment in 1 week. - Dr. Celine [name] - Room 3 Anesthetic: (In clinic) Topical Lidocaine 4% applied to wound bed - Used in clinic Bathing/ Shower/ Hygiene: Other Bathing/Shower/Hygiene Orders/Instructions: - May take a sponge bath Edema Control - Lymphedema / SCD / Other: Moisturize legs daily. - Use lotion on left  leg-STOP using the steroid cream Other Edema Control Orders/Instructions: - Elevate Legs through out the day Additional Orders / Instructions: Other: - Pick up prescription for Augmentin Laboratory ordered were: Anaerobic culture - Right Anterior Lower Leg The following medication(s) was prescribed: amoxicillin-pot clavulanate oral 875 mg-125 mg tablet 1 tab p.o. twice daily x 10 days starting 11/20/2022 WOUND #1: - Lower Leg Wound Laterality: Right, Anterior Cleanser: Soap and Water 1 x Per Week/30 Days Discharge Instructions: May shower and wash wound with dial antibacterial soap and water prior to dressing change. Cleanser: Wound Cleanser 1 x Per Week/30 Days [name] [name] [name] ([id number]) [phone].pdf Page 7 of 9 Discharge Instructions: Cleanse the wound with wound cleanser prior to applying a clean dressing using gauze sponges, not tissue or cotton balls. Peri-Wound Care: Triamcinolone 15 (g) 1 x Per Week/30 Days Discharge Instructions: Use triamcinolone 15 (g) as directed Peri-Wound Care: Sween Lotion (Moisturizing lotion) 1 x Per Week/30 Days Discharge Instructions: Apply moisturizing lotion as directed Prim Dressing: IODOFLEX 0.9% Cadexomer Iodine Pad 4x6 cm 1 x Per Week/30 Days ary Discharge Instructions: Apply to wound bed as instructed Secondary Dressing: Woven Gauze Sponge, Non-Sterile 4x4 in 1 x Per Week/30 Days Discharge Instructions: Apply over primary dressing as directed. Secondary Dressing: Zetuvit Plus 4x8 in 1 x Per Week/30 Days Discharge Instructions: Apply over primary dressing as directed. Com pression Wrap: ThreePress (3 layer compression wrap) 1 x Per Week/30 Days Discharge Instructions: Apply three layer compression as directed. 11/20/2022: Her wounds continue to worsen. They have expanded and there is thick slough on all of the wound surfaces. She is experiencing more pain. I used a curette to debride slough from the wound surfaces. This  was fairly poorly tolerated and I was unable to get as thorough debridement as I would have liked. We will use Iodoflex for ongoing chemical debridement during the week. After I debrided the wounds, I took a culture. I empirically prescribed Augmentin, but once the culture data returned, I will tailor antibiotic therapy appropriately. Continue 3 layer compression. Follow-up in 1 week. Electronic Signature(s) Signed: 11/21/2022 6:54:01 AM By: [name] [name] Signed: 11/21/2022 7:30:54 AM By: [name] Catholic RN Previous Signature: 11/20/2022 1:35:20 PM Version By: [name] [name] Entered By: [name] [name] on 11/20/2022 17:29:17 -------------------------------------------------------------------------------- HxROS Details Patient Name: Date of Service: PA DDISO [name] [name] 11/20/2022 12:30 PM Medical Record Number: [id number] Patient Account Number: [id number] Date of Birth/Sex: Treating RN: Dec 09, 1940 (81 y.o. F) Primary Care Provider: Terrill Mohr Other Clinician: Referring Provider: Treating Provider/Extender: [name] [name] in Treatment: 3 Information Obtained From Patient Eyes Medical History: Past Medical History Notes: Hx: Cataracts Ear/Nose/Mouth/Throat Medical History: Past Medical History Notes: Hx: wearing Bilateral hearing aids Respiratory Medical History: Positive for: Asthma Cardiovascular Medical History: Positive for: Hypertension Genitourinary Medical History: Past Medical History Notes: Chronic Renal Impairment Stage 3 Musculoskeletal  [name] [name] ([id number]) [id number] Page 8 of 9 Medical History: Positive for: Osteoarthritis Psychiatric Medical History: Past Medical History Notes: Hx: Depression Immunizations Pneumococcal Vaccine: Received Pneumococcal Vaccination: Yes Received Pneumococcal Vaccination On or After 60th Birthday: Yes Implantable  Devices None Hospitalization / Surgery History Type of Hospitalization/Surgery Bilateral Cataract Extraction; Bone Graft-jaw; Arm Surgery (d/t dog bite) Family and Social History Never smoker; Marital Status - Widowed; Alcohol Use: Never; Drug Use: No History; Caffeine Use: Daily; Financial Concerns: No; Food, Clothing or Shelter Needs: No; Support System Lacking: No; Transportation Concerns: No Engineer, maintenance) Signed: 11/20/2022 4:02:50 PM By: [name] [name] Entered By: [name] [name] on 11/20/2022 13:32:59 -------------------------------------------------------------------------------- SuperBill Details Patient Name: Date of Service: PA DDISO [name] [name] 11/20/2022 Medical Record Number: [id number] Patient Account Number: [id number] Date of Birth/Sex: Treating RN: 05/18/1941 (81 y.o. F) Primary Care Provider: Terrill Mohr Other Clinician: Referring Provider: Treating Provider/Extender: [name] [name] in Treatment: 3 Diagnosis Coding ICD-10 Codes Code Description [phone] Non-pressure chronic ulcer of other part of right lower leg with fat layer exposed I87.2 Venous insufficiency (chronic) (peripheral) J44.89 Other specified chronic obstructive pulmonary disease I73.9 Peripheral vascular disease, unspecified Facility Procedures : CPT4 Code: [id number] Description: [id number] - DEBRIDE WOUND 1ST 20 SQ CM OR < ICD-10 Diagnosis Description L97.812 Non-pressure chronic ulcer of other part of right lower leg with fat layer expos Modifier: ed Quantity: 1 : CPT4 Code: [id number] Description: [id number] - DEBRIDE WOUND EA ADDL 20 SQ CM ICD-10 Diagnosis Description L97.812 Non-pressure chronic ulcer of other part of right lower leg with fat layer expos Modifier: ed Quantity: 7 Physician Procedures : CPT4 Code Description Modifier [id number] - WC PHYS LEVEL 4 - EST PT 25 ICD-10 Diagnosis Description Surette, Mulki ([id number])  [id number] Non-pressure chronic ulcer of other part of right lower leg with fat layer  exposed I87.2 Venous insufficiency (chronic) (peripheral) I73.9 Peripheral vascular disease, unspecified J44.89 Other specified chronic obstructive pulmonary disease Quantity: 1 7.pdf Page 9 of 9 : [id number] - WC PHYS DEBR WO ANESTH 20 SQ CM 1 ICD-10 Diagnosis Description [id number] Non-pressure chronic ulcer of other part of right lower leg with fat layer exposed Quantity: : [id number] - WC PHYS DEBR WO ANESTH EA ADD 20 CM 7 ICD-10 Diagnosis Description [id number] Non-pressure chronic ulcer of other part of right lower leg with fat layer exposed Quantity: Electronic Signature(s) Signed: 11/20/2022 1:35:49 PM By: [name] [name] Entered By: [name] [name] on 11/20/2022 13:35:49

## 2022-11-21 NOTE — Progress Notes (Signed)
[name] ([id number]) [id number] Page 1 of 7 Visit Report for 11/20/2022 Arrival Information Details Patient Name: Date of Service: PA DDISO [name] [facility] 11/20/2022 12:30 PM Medical Record Number: [id number] Patient Account Number: [id number] Date of Birth/Sex: Treating RN: [birth year] (81 y.o. [name] Primary Care [name]: Brett Fairy Other Clinician: Referring [name]: Treating [name]/Extender: [name] in Treatment: 3 Visit Information History Since Last Visit Added or deleted any medications: No Patient Arrived: [name] Any new allergies or adverse reactions: No Arrival Time: 12:54 Had a fall or experienced change in No Accompanied By: friend activities of daily living that may affect Transfer Assistance: Manual risk of falls: Signs or symptoms of abuse/neglect since last visito No Hospitalized since last visit: No Implantable device outside of the clinic excluding No cellular tissue based products placed in the center since last visit: Has Dressing in Place as Prescribed: Yes Has Compression in Place as Prescribed: Yes Pain Present Now: Yes Electronic Signature(s) Signed: 11/21/2022 7:30:54 AM By: Karie Schwalbe RN Entered By: Karie Schwalbe on 11/20/2022 12:55:25 -------------------------------------------------------------------------------- Encounter Discharge Information Details Patient Name: Date of Service: [name] 11/20/2022 12:30 PM Medical Record Number: [id number] Patient Account Number: [id number] Date of Birth/Sex: Treating RN: 09-13-1941 (81 y.o. [name] Primary Care [name]: Brett Fairy Other Clinician: Referring [name]: Treating [name]/Extender: [name] in Treatment: 3 Encounter Discharge Information Items Post Procedure Vitals Discharge Condition: Stable Temperature (F): 98.2 Ambulatory Status: Cane Pulse  (bpm): 99 Discharge Destination: Home Respiratory Rate (breaths/min): 16 Transportation: Private Auto Blood Pressure (mmHg): 163/100 Accompanied By: friend Schedule Follow-up Appointment: Yes Clinical Summary of Care: Patient Declined Electronic Signature(s) Signed: 11/21/2022 7:30:54 AM By: Karie Schwalbe RN Entered By: Karie Schwalbe on 11/21/2022 07:29:05 [name], [name] Ee ([id number]) [id number] Page 2 of 7 -------------------------------------------------------------------------------- Lower Extremity Assessment Details Patient Name: Date of Service: [name] 11/20/2022 12:30 PM Medical Record Number: [id number] Patient Account Number: [id number] Date of Birth/Sex: Treating RN: Oct 13, 1941 (81 y.o. [name] Primary Care [name]: Brett Fairy Other Clinician: Referring [name]: Treating [name]/Extender: [name] Weeks in Treatment: 3 Edema Assessment Assessed: [Left: No] [Right: No] [Left: Edema] [Right: :] Calf Left: Right: Point of Measurement: 36 cm From Medial Instep 45 cm Ankle Left: Right: Point of Measurement: 10 cm From Medial Instep 26.8 cm Vascular Assessment Pulses: Dorsalis Pedis Palpable: [Right:Yes] Electronic Signature(s) Signed: 11/21/2022 7:30:54 AM By: Karie Schwalbe RN Entered By: Karie Schwalbe on 11/20/2022 12:57:15 -------------------------------------------------------------------------------- Multi Wound Chart Details Patient Name: Date of Service: [name] 11/20/2022 12:30 PM Medical Record Number: [id number] Patient Account Number: [id number] Date of Birth/Sex: Treating RN: [birth year] (81 y.o. F) Primary Care [name]: Brett Fairy Other Clinician: Referring [name]: Treating [name]/Extender: [name] Weeks in Treatment: 3 Vital Signs Height(in): 64 Pulse(bpm): 99 Weight(lbs): 180 Blood Pressure(mmHg): 163/100 Body  Mass Index(BMI): 30.9 Temperature(F): 98.2 Respiratory Rate(breaths/min): 16 [1:Photos:] [N/A:N/A] Right, Anterior Lower Leg N/A N/A Wound Location: Gradually Appeared N/A N/A Wounding Event: Venous Leg Ulcer N/A N/A Primary Etiology: Asthma, Hypertension, Osteoarthritis N/A N/A Comorbid History: 09/30/2022 N/A N/A Date Acquired: 3 N/A N/A Weeks of Treatment: Open N/A N/A Wound Status: No N/A N/A Wound Recurrence: Yes N/A N/A Clustered Wound: 16x9.2x0.1 N/A N/A Measurements L x W x D (cm) 115.611 N/A N/A A (cm) : rea 11.561 N/A N/A Volume (cm) : -197.40% N/A N/A % Reduction in A rea: -197.40% N/A N/A % Reduction in Volume: Full Thickness Without  Exposed N/A N/A Classification: Support Structures Large N/A N/A Exudate A mount: Serosanguineous N/A N/A Exudate Type: red, brown N/A N/A Exudate Color: Distinct, outline attached N/A N/A Wound Margin: Small (1-33%) N/A N/A Granulation A mount: Red, Hyper-granulation N/A N/A Granulation Quality: Large (67-100%) N/A N/A Necrotic A mount: Eschar, Adherent Slough N/A N/A Necrotic Tissue: Fat Layer (Subcutaneous Tissue): Yes N/A N/A Exposed Structures: Fascia: No Tendon: No Muscle: No Joint: No Bone: No Small (1-33%) N/A N/A Epithelialization: Debridement - Selective/Open Wound N/A N/A Debridement: Pre-procedure Verification/Time Out 13:25 N/A N/A Taken: Lidocaine 5% topical ointment N/A N/A Pain Control: Slough N/A N/A Tissue Debrided: Non-Viable Tissue N/A N/A Level: 147.2 N/A N/A Debridement A (sq cm): rea Curette N/A N/A Instrument: Minimum N/A N/A Bleeding: Pressure N/A N/A Hemostasis A chieved: 0 N/A N/A Procedural Pain: 0 N/A N/A Post Procedural Pain: Procedure was tolerated well N/A N/A Debridement Treatment Response: 16x9.2x0.1 N/A N/A Post Debridement Measurements L x W x D (cm) 11.561 N/A N/A Post Debridement Volume: (cm) Scarring: Yes N/A N/A Periwound Skin  Texture: Excoriation: No Maceration: No N/A N/A Periwound Skin Moisture: Dry/Scaly: No Hemosiderin Staining: Yes N/A N/A Periwound Skin Color: No Abnormality N/A N/A Temperature: Clustered wound N/A N/A Assessment Notes: Debridement N/A N/A Procedures Performed: Treatment Notes Electronic Signature(s) Signed: 11/20/2022 1:32:13 PM By: Duanne Guess MD FACS Entered By: Duanne Guess on 11/20/2022 13:32:13 -------------------------------------------------------------------------------- Multi-Disciplinary Care Plan Details Patient Name: Date of Service: [name] 11/20/2022 12:30 PM Medical Record Number: [id number] Patient Account Number: [id number] Date of Birth/Sex: Treating RN: 07/05/1941 (81 y.o. [name] Primary Care Raejean Swinford: Brett Fairy Other Clinician: Referring Talya Quain: Treating Rhys Lichty/Extender: [name] Weeks in Treatment: 3 Greenly, [name] Ee ([id number]) [phone].pdf Page 4 of 7 Active Inactive Wound/Skin Impairment Nursing Diagnoses: Impaired tissue integrity Goals: Patient/caregiver will verbalize understanding of skin care regimen Date Initiated: 10/30/2022 Target Resolution Date: 01/22/2023 Goal Status: Active Interventions: Assess ulceration(s) every visit Treatment Activities: Skin care regimen initiated : 10/30/2022 Notes: Electronic Signature(s) Signed: 11/21/2022 7:30:54 AM By: Karie Schwalbe RN Entered By: Karie Schwalbe on 11/21/2022 07:26:15 -------------------------------------------------------------------------------- Pain Assessment Details Patient Name: Date of Service: [name] 11/20/2022 12:30 PM Medical Record Number: [id number] Patient Account Number: [id number] Date of Birth/Sex: Treating RN: 04/25/1941 (81 y.o. [name] Primary Care Dantae Meunier: Brett Fairy Other Clinician: Referring Ramonita Koenig: Treating Chanz Cahall/Extender: [name] Weeks in Treatment: 3 Active Problems Location of Pain Severity and Description of Pain Patient Has Paino Yes Site Locations Pain Location: Pain in Ulcers With Dressing Change: No Duration of the Pain. Constant / Intermittento Constant Rate the pain. Current Pain Level: 6 Worst Pain Level: 10 Least Pain Level: 4 Tolerable Pain Level: 6 Character of Pain Describe the Pain: Stabbing Pain Management and Medication Current Pain Management: Medication: Yes Cold Application: No Rest: Yes Massage: No Activity: No T.E.N.S.: No Heat Application: No Leg drop or elevation: No Is the Current Pain Management Adequate: Adequate Manzella, [name] Ee ([id number]) [id number] Page 5 of 7 How does your wound impact your activities of daily livingo Sleep: No Bathing: No Appetite: No Relationship With Others: No Bladder Continence: No Emotions: No Bowel Continence: No Work: No Toileting: No Drive: No Dressing: No Hobbies: No Electronic Signature(s) Signed: 11/21/2022 7:30:54 AM By: Karie Schwalbe RN Entered By: Karie Schwalbe on 11/20/2022 12:56:35 -------------------------------------------------------------------------------- Patient/Caregiver Education Details Patient Name: Date of Service: [name] 12/28/2023andnbsp12:30 PM Medical Record Number: [id number] Patient Account Number: [id number] Date of Birth/Gender:  Treating RN: 03-Sep-1941 (81 y.o. [name] Primary Care Physician: Brett Fairy Other Clinician: Referring Physician: Treating Physician/Extender: [name] in Treatment: 3 Education Assessment Education Provided To: Patient Education Topics Provided Wound/Skin Impairment: Methods: Explain/Verbal Responses: Return demonstration correctly Electronic Signature(s) Signed: 11/21/2022 7:30:54 AM By: Karie Schwalbe RN Entered By: Karie Schwalbe on 11/21/2022  07:26:51 -------------------------------------------------------------------------------- Wound Assessment Details Patient Name: Date of Service: [name] 11/20/2022 12:30 PM Medical Record Number: [id number] Patient Account Number: [id number] Date of Birth/Sex: Treating RN: 08/30/1941 (81 y.o. [name] Primary Care Shreshta Medley: Brett Fairy Other Clinician: Referring Gwen Edler: Treating Jerrie Schussler/Extender: [name] Weeks in Treatment: 3 Wound Status Wound Number: 1 Primary Etiology: Venous Leg Ulcer Wound Location: Right, Anterior Lower Leg Wound Status: Open Wounding Event: Gradually Appeared Comorbid History: Asthma, Hypertension, Osteoarthritis Date Acquired: 09/30/2022 Weeks Of Treatment: 3 Clustered Wound: Yes Photos Bosler, [name] Ee ([id number]) [id number] Page 6 of 7 Wound Measurements Length: (cm) 16 Width: (cm) 9.2 Depth: (cm) 0.1 Area: (cm) 115.611 Volume: (cm) 11.561 % Reduction in Area: -197.4% % Reduction in Volume: -197.4% Epithelialization: Small (1-33%) Tunneling: No Undermining: No Wound Description Classification: Full Thickness Without Exposed Support Structures Wound Margin: Distinct, outline attached Exudate Amount: Large Exudate Type: Serosanguineous Exudate Color: red, brown Foul Odor After Cleansing: No Slough/Fibrino Yes Wound Bed Granulation Amount: Small (1-33%) Exposed Structure Granulation Quality: Red, Hyper-granulation Fascia Exposed: No Necrotic Amount: Large (67-100%) Fat Layer (Subcutaneous Tissue) Exposed: Yes Necrotic Quality: Eschar, Adherent Slough Tendon Exposed: No Muscle Exposed: No Joint Exposed: No Bone Exposed: No Periwound Skin Texture Texture Color No Abnormalities Noted: No No Abnormalities Noted: No Excoriation: No Hemosiderin Staining: Yes Scarring: Yes Temperature / Pain Temperature: No Abnormality Moisture No Abnormalities Noted:  No Dry / Scaly: No Maceration: No Assessment Notes Clustered wound Treatment Notes Wound #1 (Lower Leg) Wound Laterality: Right, Anterior Cleanser Soap and Water Discharge Instruction: May shower and wash wound with dial antibacterial soap and water prior to dressing change. Wound Cleanser Discharge Instruction: Cleanse the wound with wound cleanser prior to applying a clean dressing using gauze sponges, not tissue or cotton balls. Peri-Wound Care Triamcinolone 15 (g) Discharge Instruction: Use triamcinolone 15 (g) as directed Sween Lotion (Moisturizing lotion) Discharge Instruction: Apply moisturizing lotion as directed Topical Primary Dressing IODOFLEX 0.9% Cadexomer Iodine Pad 4x6 cm Discharge Instruction: Apply to wound bed as instructed Secondary Dressing Woven Gauze Sponge, Non-Sterile 4x4 in Derks, Jauna ([id number]) [id number] Page 7 of 7 Discharge Instruction: Apply over primary dressing as directed. Zetuvit Plus 4x8 in Discharge Instruction: Apply over primary dressing as directed. Secured With Compression Wrap ThreePress (3 layer compression wrap) Discharge Instruction: Apply three layer compression as directed. Compression Stockings Add-Ons Electronic Signature(s) Signed: 11/21/2022 7:30:54 AM By: Karie Schwalbe RN Entered By: Karie Schwalbe on 11/20/2022 13:03:48 -------------------------------------------------------------------------------- Vitals Details Patient Name: Date of Service: [name] 11/20/2022 12:30 PM Medical Record Number: [id number] Patient Account Number: [id number] Date of Birth/Sex: Treating RN: [birth year] (81 y.o. [name] Primary Care Redith Drach: Brett Fairy Other Clinician: Referring Kinsley Holderman: Treating Fanny Agan/Extender: [name] Weeks in Treatment: 3 Vital Signs Time Taken: 12:38 Temperature (F): 98.2 Height (in): 64 Pulse (bpm): 99 Weight (lbs):  180 Respiratory Rate (breaths/min): 16 Body Mass Index (BMI): 30.9 Blood Pressure (mmHg): 163/100 Reference Range: 80 - 120 mg / dl Electronic Signature(s) Signed: 11/21/2022 7:30:54 AM By: Karie Schwalbe RN Entered By: Karie Schwalbe on 11/20/2022 12:55:50

## 2022-11-28 ENCOUNTER — Encounter (HOSPITAL_BASED_OUTPATIENT_CLINIC_OR_DEPARTMENT_OTHER): Payer: Medicare Other | Attending: General Surgery | Admitting: General Surgery

## 2022-11-28 DIAGNOSIS — I872 Venous insufficiency (chronic) (peripheral): Secondary | ICD-10-CM | POA: Insufficient documentation

## 2022-11-28 DIAGNOSIS — J4489 Other specified chronic obstructive pulmonary disease: Secondary | ICD-10-CM | POA: Insufficient documentation

## 2022-11-28 DIAGNOSIS — I739 Peripheral vascular disease, unspecified: Secondary | ICD-10-CM | POA: Insufficient documentation

## 2022-11-28 DIAGNOSIS — I129 Hypertensive chronic kidney disease with stage 1 through stage 4 chronic kidney disease, or unspecified chronic kidney disease: Secondary | ICD-10-CM | POA: Insufficient documentation

## 2022-11-28 DIAGNOSIS — N183 Chronic kidney disease, stage 3 unspecified: Secondary | ICD-10-CM | POA: Diagnosis not present

## 2022-11-28 DIAGNOSIS — L97812 Non-pressure chronic ulcer of other part of right lower leg with fat layer exposed: Secondary | ICD-10-CM | POA: Diagnosis present

## 2022-11-29 NOTE — Progress Notes (Signed)
[name] ([id number]) [id number] Page 1 of 9 Visit Report for 11/28/2022 Chief Complaint Document Details Patient Name: Date of Service: [name] [facility] 11/28/2022 8:00 A M Medical Record Number: [id number] Patient Account Number: [id number] Date of Birth/Sex: Treating RN: [birth year] (82 y.o. F) Primary Care Provider: Brett [name] Other Clinician: Referring Provider: Treating Provider/Extender: [name] in Treatment: 4 Information Obtained from: Patient Chief Complaint Patient presents for treatment of open ulcers due to venous insufficiency Electronic Signature(s) Signed: 11/28/2022 8:44:40 AM By: [name] Guess MD FACS Entered By: [name] on 11/28/2022 08:44:40 -------------------------------------------------------------------------------- Debridement Details Patient Name: Date of Service: [name] 11/28/2022 8:00 A M Medical Record Number: [id number] Patient Account Number: [id number] Date of Birth/Sex: Treating RN: December 06, 1940 (82 y.o. [name], [name] Primary Care Provider: Brett [name] Other Clinician: Referring Provider: Treating Provider/Extender: [name] Weeks in Treatment: 4 Debridement Performed for Assessment: Wound #1 Right,Anterior Lower Leg Performed By: Physician [name] Guess, MD Debridement Type: Debridement Severity of Tissue Pre Debridement: Fat layer exposed Level of Consciousness (Pre-procedure): Awake and Alert Pre-procedure Verification/Time Out Yes - 08:29 Taken: Start Time: 08:30 Pain Control: Lidocaine 5% topical ointment T Area Debrided (L x W): otal 8 (cm) x 7 (cm) = 56 (cm) Tissue and other material debrided: Viable, Non-Viable, Slough, Subcutaneous, Slough Level: Skin/Subcutaneous Tissue Debridement Description: Excisional Instrument: Curette Bleeding: Minimum Hemostasis Achieved: Pressure Response to Treatment: Procedure was  tolerated well Level of Consciousness (Post- Awake and Alert procedure): Post Debridement Measurements of Total Wound Length: (cm) 12 Width: (cm) 7 Depth: (cm) 0.1 Volume: (cm) 6.597 Character of Wound/Ulcer Post Debridement: Improved Severity of Tissue Post Debridement: Fat layer exposed Post Procedure Diagnosis Same as Pre-procedure [name], [name] Ee ([id number]) [id number] Page 2 of 9 Notes Scribed for Dr. Lady [name] by [name] Ard, RN Electronic Signature(s) Signed: 11/28/2022 1:30:14 PM By: [name] Guess MD FACS Signed: 11/28/2022 4:06:17 PM By: [name] Ard RN Entered By: [name] on 11/28/2022 08:33:11 -------------------------------------------------------------------------------- HPI Details Patient Name: Date of Service: [name] 11/28/2022 8:00 A M Medical Record Number: [id number] Patient Account Number: [id number] Date of Birth/Sex: Treating RN: 1941-06-17 (82 y.o. 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 The patient and her family elected for comfort care however somehow she managed to heal her wounds. From the electronic medical record, it looks like she was receiving home health services and being followed by her primary care provider. At the beginning of November, she saw her PCP again and had new ulcers on her right leg. Home health services were not available to assist the patient and she was referred to the wound care center for further evaluation and management. On the right anterior  lower leg, she has multiple ulcers of varying degrees and depths. Some are limited to breakdown of skin and others are deeper, into the fat layer. There is slough and some eschar accumulation. She has periwound erythema and 1-2+ edema. 12/14-second visit for this woman who arrived to clinic accompanied by a neighbor. 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 She tells me that in 2021 she had bilateral  lower leg wounds which took an ordinarily long period of time to healo 2 years. The current wounds have been on the right leg for about 2 months. She may or may not have been wearing a support stocking on the right leg. She has not been using anything on the left but fortunately does not have any wounds there. She makes it clear to me that she cannot get standard over the toe stockings on herself. Her husband passed away sometime earlier this year I believe 11/13/2022: All of the wounds seem to have progressed, getting larger and at this point and the fat layer is exposed in all locations. There is slough accumulation on all surfaces. 11/20/2022: Her wounds continue to worsen. They have expanded and there is thick slough on all of the wound surfaces. She is experiencing more pain. 11/28/2022: The cultures that I took last week grew out methicillin-resistant Staph aureus so we discontinued Augmentin and initiated doxycycline. She is currently taking this. Her wounds are less painful today. They are all about the same size, but they have not expanded. There is rubbery slough on the wound surfaces. Electronic Signature(s) Signed: 11/28/2022 8:45:29 AM By: [name] Maudlin MD FACS Entered By: [name] on 11/28/2022 08:45:29 -------------------------------------------------------------------------------- Physical Exam Details Patient Name: Date of Service: [name] 11/28/2022 8:00 A M Medical Record Number: [id number] Patient Account Number: [id number] Date of  Birth/Sex: Treating RN: 12/14/1940 (82 y.o. F) Primary Care Provider: Terrill Mohr Other Clinician: Referring Provider: Treating Provider/Extender: Renaldo Harrison in Treatment: 4 [name], [name] ([id number]) [id number] Page 3 of 9 Constitutional Slightly hypertensive. Slightly tachycardic. . . no acute distress. Respiratory Normal work of breathing on room air. Notes 11/28/2022: Her wounds are less painful today. They are all about the same size, but they have not expanded. There is rubbery slough on the wound surfaces. Electronic Signature(s) Signed: 11/28/2022 8:46:30 AM By: [name] Maudlin MD FACS Entered By: [name] on 11/28/2022 08:46:30 -------------------------------------------------------------------------------- Physician Orders Details Patient Name: Date of Service: [name] 11/28/2022 8:00 A M Medical Record Number: [id number] Patient Account Number: [id number] Date of Birth/Sex: Treating RN: [birth year] (82 y.o. [name] Primary Care Provider: Terrill Mohr Other Clinician: Referring Provider: Treating Provider/Extender: Renaldo Harrison in Treatment: 4 Verbal / Phone Orders: No Diagnosis Coding ICD-10 Coding Code Description [phone] Non-pressure chronic ulcer of other part of right lower leg with fat layer exposed I87.2 Venous insufficiency (chronic) (peripheral) J44.89 Other specified chronic obstructive pulmonary disease I73.9 Peripheral vascular disease, unspecified Follow-up Appointments ppointment in 1 week. - Dr. Celine Ahr - Room 3 Return A Anesthetic (In clinic) Topical Lidocaine 4% applied to wound bed - Used in clinic Bathing/ Shower/ Hygiene Other Bathing/Shower/Hygiene Orders/Instructions: - May take a sponge bath Edema Control - Lymphedema / SCD / Other Moisturize legs daily. - Use lotion on left leg-STOP using the steroid cream Other Edema Control  Orders/Instructions: - Elevate Legs through out the day Additional Orders / Instructions Other: - Pick up prescription for Augmentin Wound Treatment Wound #1 - Lower Leg Wound Laterality: Right, Anterior Cleanser: Soap and Water 1 x Per Week/30 Days Discharge Instructions: May shower and wash wound with dial antibacterial soap and water prior to dressing change. Cleanser: Wound Cleanser 1 x Per Week/30 Days Discharge Instructions: Cleanse the wound with wound cleanser prior to applying a clean dressing using gauze sponges, not tissue or cotton balls. Peri-Wound Care: Triamcinolone 15 (g) 1 x Per Week/30 Days Discharge  Instructions: Use triamcinolone 15 (g) as directed Peri-Wound Care: Sween Lotion (Moisturizing lotion) 1 x Per Week/30 Days Discharge Instructions: Apply moisturizing lotion as directed Topical: Mupirocin Ointment 1 x Per Week/30 Days Discharge Instructions: Apply Mupirocin (Bactroban) as instructed Prim Dressing: Sorbalgon AG Dressing, 4x4 (in/in) ary 1 x Per Week/30 Days Evitts, [name] Ee ([id number]) [id number] Page 4 of 9 Discharge Instructions: Apply to wound bed as instructed Secondary Dressing: Woven Gauze Sponge, Non-Sterile 4x4 in 1 x Per Week/30 Days Discharge Instructions: Apply over primary dressing as directed. Secondary Dressing: Zetuvit Plus 4x8 in 1 x Per Week/30 Days Discharge Instructions: Apply over primary dressing as directed. Compression Wrap: ThreePress (3 layer compression wrap) 1 x Per Week/30 Days Discharge Instructions: Apply three layer compression as directed. Electronic Signature(s) Signed: 11/28/2022 1:30:14 PM By: [name] Guess MD FACS Entered By: [name] on 11/28/2022 08:46:43 -------------------------------------------------------------------------------- Problem List Details Patient Name: Date of Service: [name] 11/28/2022 8:00 A M Medical Record Number: [id number] Patient Account Number:  [id number] Date of Birth/Sex: Treating RN: 12-22-1940 (82 y.o. F) Primary Care Provider: Brett [name] Other Clinician: Referring Provider: Treating Provider/Extender: [name] Weeks in Treatment: 4 Active Problems ICD-10 Encounter Code Description Active Date MDM Diagnosis [phone] Non-pressure chronic ulcer of other part of right lower leg with fat layer 10/30/2022 No Yes exposed I87.2 Venous insufficiency (chronic) (peripheral) 10/30/2022 No Yes J44.89 Other specified chronic obstructive pulmonary disease 10/30/2022 No Yes I73.9 Peripheral vascular disease, unspecified 10/30/2022 No Yes Inactive Problems Resolved Problems Electronic Signature(s) Signed: 11/28/2022 8:44:29 AM By: [name] Guess MD FACS Entered By: [name] on 11/28/2022 08:44:28 Zambrana, [name] Ee ([id number]) [id number] Page 5 of 9 -------------------------------------------------------------------------------- Progress Note Details Patient Name: Date of Service: [name] 11/28/2022 8:00 A M Medical Record Number: [id number] Patient Account Number: [id number] Date of Birth/Sex: Treating RN: Jul 02, 1941 (82 y.o. 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 The patient and her family elected for comfort care however somehow she managed to heal her wounds. From the electronic medical record, it  looks like she was receiving home health services and being followed by her primary care provider. At the beginning of November, she saw her PCP again and had new ulcers on her right leg. Home health services were not available to assist the patient and she was referred to the wound care center for further evaluation and management. On the right anterior lower leg, she has multiple ulcers of varying degrees and depths. Some are limited to breakdown of skin and others are deeper, into the fat layer. There is slough and some eschar accumulation. She has periwound erythema and 1-2+ edema. 12/14-second visit for this woman who arrived to clinic accompanied by a neighbor. 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 She tells me that in 2021 she had bilateral lower leg wounds which took an ordinarily long period of time to healo 2 years. The current wounds have been on the right leg for about 2 months. She may or may not have been wearing  a support stocking on the right leg. She has not been using anything on the left but fortunately does not have any wounds there. She makes it clear to me that she cannot get standard over the toe stockings on herself. Her husband passed away sometime earlier this year I believe 11/13/2022: All of the wounds seem to have progressed, getting larger and at this point and the fat layer is exposed in all locations. There is slough accumulation on all surfaces. 11/20/2022: Her wounds continue to worsen. They have expanded and there is thick slough on all of the wound surfaces. She is experiencing more pain. 11/28/2022: The cultures that I took last week grew out methicillin-resistant Staph aureus so we discontinued Augmentin and initiated doxycycline. She is currently taking this. Her wounds are less painful today. They are all about the same size, but they have not expanded. There is rubbery slough on the wound surfaces. Patient History Information  obtained from Patient. Social History Never smoker, Marital Status - Widowed, Alcohol Use - Never, Drug Use - No History, Caffeine Use - Daily. Medical History Respiratory Patient has history of Asthma Cardiovascular Patient has history of Hypertension Musculoskeletal Patient has history of Osteoarthritis Hospitalization/Surgery History - Bilateral Cataract Extraction; Bone Graft-jaw; Arm Surgery (d/t dog bite). Medical A Surgical History Notes nd Eyes Hx: Cataracts Ear/Nose/Mouth/Throat Hx: wearing Bilateral hearing aids Genitourinary Chronic Renal Impairment Stage 3 Psychiatric Hx: Depression Objective Constitutional Slightly hypertensive. Slightly tachycardic. no acute distress. Vitals Time Taken: 8:09 AM, Height: 64 in, Weight: 180 lbs, BMI: 30.9, Temperature: 98.3 F, Pulse: 101 bpm, Respiratory Rate: 18 breaths/min, Blood Basque, Hyla ([id number]) [id number] Page 6 of 9 Pressure: 146/88 mmHg. Respiratory Normal work of breathing on room air. General Notes: 11/28/2022: Her wounds are less painful today. They are all about the same size, but they have not expanded. There is rubbery slough on the wound surfaces. Integumentary (Hair, Skin) Wound #1 status is Open. Original cause of wound was Gradually Appeared. The date acquired was: 09/30/2022. The wound has been in treatment 4 weeks. The wound is located on the Right,Anterior Lower Leg. The wound measures 12cm length x 7cm width x 0.1cm depth; 65.973cm^2 area and 6.597cm^3 volume. There is Fat Layer (Subcutaneous Tissue) exposed. There is no tunneling or undermining noted. There is a large amount of serosanguineous drainage noted. The wound margin is distinct with the outline attached to the wound base. There is small (1-33%) red, hyper - granulation within the wound bed. There is a large (67-100%) amount of necrotic tissue within the wound bed including Eschar and Adherent Slough. The periwound  skin appearance exhibited: Scarring, Hemosiderin Staining. The periwound skin appearance did not exhibit: Excoriation, Dry/Scaly, Maceration. Periwound temperature was noted as No Abnormality. Assessment Active Problems ICD-10 Non-pressure chronic ulcer of other part of right lower leg with fat layer exposed Venous insufficiency (chronic) (peripheral) Other specified chronic obstructive pulmonary disease Peripheral vascular disease, unspecified Procedures Wound #1 Pre-procedure diagnosis of Wound #1 is a Venous Leg Ulcer located on the Right,Anterior Lower Leg .Severity of Tissue Pre Debridement is: Fat layer exposed. There was a Excisional Skin/Subcutaneous Tissue Debridement with a total area of 56 sq cm performed by [name] Guess, MD. With the following instrument(s): Curette to remove Viable and Non-Viable tissue/material. Material removed includes Subcutaneous Tissue and Slough and after achieving pain control using Lidocaine 5% topical ointment. No specimens were taken. A time out was conducted at 08:29, prior to the start of the procedure. A Minimum amount of bleeding  was controlled with Pressure. The procedure was tolerated well. Post Debridement Measurements: 12cm length x 7cm width x 0.1cm depth; 6.597cm^3 volume. Character of Wound/Ulcer Post Debridement is improved. Severity of Tissue Post Debridement is: Fat layer exposed. Post procedure Diagnosis Wound #1: Same as Pre-Procedure General Notes: Scribed for Dr. Lady [name] by [name] Ard, RN. Pre-procedure diagnosis of Wound #1 is a Venous Leg Ulcer located on the Right,Anterior Lower Leg . There was a Three Layer Compression Therapy Procedure by [name] Ard, RN. Post procedure Diagnosis Wound #1: Same as Pre-Procedure Plan Follow-up Appointments: Return Appointment in 1 week. - Dr. Lady [name] - Room 3 Anesthetic: (In clinic) Topical Lidocaine 4% applied to wound bed - Used in clinic Bathing/ Shower/ Hygiene: Other  Bathing/Shower/Hygiene Orders/Instructions: - May take a sponge bath Edema Control - Lymphedema / SCD / Other: Moisturize legs daily. - Use lotion on left leg-STOP using the steroid cream Other Edema Control Orders/Instructions: - Elevate Legs through out the day Additional Orders / Instructions: Other: - Pick up prescription for Augmentin WOUND #1: - Lower Leg Wound Laterality: Right, Anterior Cleanser: Soap and Water 1 x Per Week/30 Days Discharge Instructions: May shower and wash wound with dial antibacterial soap and water prior to dressing change. Cleanser: Wound Cleanser 1 x Per Week/30 Days Discharge Instructions: Cleanse the wound with wound cleanser prior to applying a clean dressing using gauze sponges, not tissue or cotton balls. Peri-Wound Care: Triamcinolone 15 (g) 1 x Per Week/30 Days Discharge Instructions: Use triamcinolone 15 (g) as directed Peri-Wound Care: Sween Lotion (Moisturizing lotion) 1 x Per Week/30 Days Discharge Instructions: Apply moisturizing lotion as directed Topical: Mupirocin Ointment 1 x Per Week/30 Days Discharge Instructions: Apply Mupirocin (Bactroban) as instructed Prim Dressing: Sorbalgon AG Dressing, 4x4 (in/in) 1 x Per Week/30 Days ary Discharge Instructions: Apply to wound bed as instructed Secondary Dressing: Woven Gauze Sponge, Non-Sterile 4x4 in 1 x Per Week/30 Days Discharge Instructions: Apply over primary dressing as directed. Secondary Dressing: Zetuvit Plus 4x8 in 1 x Per Week/30 Days Discharge Instructions: Apply over primary dressing as directed. [name] ([id number]) [id number] Page 7 of 9 Compression Wrap: ThreePress (3 layer compression wrap) 1 x Per Week/30 Days Discharge Instructions: Apply three layer compression as directed. 11/28/2022: Her wounds are less painful today. They are all about the same size, but they have not expanded. There is rubbery slough on the wound surfaces. I used a curette  to debride slough and nonviable subcutaneous tissue from the wound surfaces. Will use topical mupirocin with silver alginate and 3 layer compression. She will continue to take the oral doxycycline that was prescribed. I recommended that she add a probiotic and be sure to take the doxycycline with food, as it has been upsetting her stomach a little bit. She will follow-up in 1 week. Electronic Signature(s) Signed: 11/28/2022 8:47:24 AM By: [name] Guess MD FACS Entered By: [name] on 11/28/2022 08:47:24 -------------------------------------------------------------------------------- HxROS Details Patient Name: Date of Service: [name] 11/28/2022 8:00 A M Medical Record Number: [id number] Patient Account Number: [id number] Date of Birth/Sex: Treating RN: [birth year] (82 y.o. F) Primary Care Provider: Brett [name] Other Clinician: Referring Provider: Treating Provider/Extender: [name] in Treatment: 4 Information Obtained From Patient Eyes Medical History: Past Medical History Notes: Hx: Cataracts Ear/Nose/Mouth/Throat Medical History: Past Medical History Notes: Hx: wearing Bilateral hearing aids Respiratory Medical History: Positive for: Asthma Cardiovascular Medical History: Positive for: Hypertension Genitourinary Medical History: Past Medical History Notes: Chronic Renal Impairment Stage 3 Musculoskeletal  Medical History: Positive for: Osteoarthritis Psychiatric Medical History: Past Medical History Notes: Hx: Depression Immunizations Bost, [name] EeEVELYN ([id number]) [id number] Page 8 of 9 Pneumococcal Vaccine: Received Pneumococcal Vaccination: Yes Received Pneumococcal Vaccination On or After 60th Birthday: Yes Implantable Devices None Hospitalization / Surgery History Type of Hospitalization/Surgery Bilateral Cataract Extraction; Bone Graft-jaw; Arm Surgery (d/t dog bite) Family and  Social History Never smoker; Marital Status - Widowed; Alcohol Use: Never; Drug Use: No History; Caffeine Use: Daily; Financial Concerns: No; Food, Clothing or Shelter Needs: No; Support System Lacking: No; Transportation Concerns: No Electronic Signature(s) Signed: 11/28/2022 1:30:14 PM By: [name] Guessannon, Verna Hamon MD FACS Entered By: [name] Guessannon, Greyson Riccardi on 11/28/2022 08:45:41 -------------------------------------------------------------------------------- SuperBill Details Patient Name: Date of Service: [name], EV ELYN 11/28/2022 Medical Record Number: [id number] Patient Account Number: [id number] Date of Birth/Sex: Treating RN: [birth year] (81 y.o. F) Primary Care Provider: Brett FairyEksir, Samantha Other Clinician: Referring Provider: Treating Provider/Extender: [name] Shamsannon, Johnsie Moscoso Eksir, Samantha Weeks in Treatment: 4 Diagnosis Coding ICD-10 Codes Code Description [phone] Non-pressure chronic ulcer of other part of right lower leg with fat layer exposed I87.2 Venous insufficiency (chronic) (peripheral) J44.89 Other specified chronic obstructive pulmonary disease I73.9 Peripheral vascular disease, unspecified Facility Procedures : CPT4 Code: [id number] Description: 11042 - DEB SUBQ TISSUE 20 SQ CM/< ICD-10 Diagnosis Description L97.812 Non-pressure chronic ulcer of other part of right lower leg with fat layer expo Modifier: sed Quantity: 1 : CPT4 Code: [phone] Description: 11045 - DEB SUBQ TISS EA ADDL 20CM ICD-10 Diagnosis Description L97.812 Non-pressure chronic ulcer of other part of right lower leg with fat layer expo Modifier: sed Quantity: 2 Physician Procedures : CPT4 Code Description Modifier [id number] 99214 - WC PHYS LEVEL 4 - EST PT 25 ICD-10 Diagnosis Description L97.812 Non-pressure chronic ulcer of other part of right lower leg with fat layer exposed I87.2 Venous insufficiency (chronic) (peripheral) I73.9  Peripheral vascular disease, unspecified J44.89 Other specified chronic obstructive  pulmonary disease Quantity: 1 : [id number] 11042 - WC PHYS SUBQ TISS 20 SQ CM ICD-10 Diagnosis Description L97.812 Non-pressure chronic ulcer of other part of right lower leg with fat layer exposed Quantity: 1 : [id number] 11045 - WC PHYS SUBQ TISS EA ADDL 20 CM Deguire, Chandel ([id number]) [id number] Quantity: 2 [id number].pdf Page 9 of 9 : ICD-10 Diagnosis Description L97.812 Non-pressure chronic ulcer of other part of right lower leg with fat layer exposed Quantity: Electronic Signature(s) Signed: 11/28/2022 8:47:58 AM By: [name] Guessannon, Alanii Ramer MD FACS Entered By: [name] Guessannon, Kalee Mcclenathan on 11/28/2022 08:47:58

## 2022-11-29 NOTE — Progress Notes (Signed)
[name] ([id number]) [id number] Page 1 of 8 Visit Report for 11/28/2022 Arrival Information Details Patient Name: Date of Service: [name] 11/28/2022 8:00 A M Medical Record Number: [id number] Patient Account Number: [id number] Date of Birth/Sex: Treating RN: 1941/02/14 (82 y.o. [name], [name] Primary Care Jaqwon Manfred: Terrill Mohr Other Clinician: Referring Tenishia Ekman: Treating Hillis Mcphatter/Extender: Renaldo Harrison in Treatment: 4 Visit Information History Since Last Visit Added or deleted any medications: No Patient Arrived: [name] Any new allergies or adverse reactions: No Arrival Time: 08:09 Had a fall or experienced change in No Accompanied By: neighbor activities of daily living that may affect Transfer Assistance: None risk of falls: Signs or symptoms of abuse/neglect since last visito No Hospitalized since last visit: No Implantable device outside of the clinic excluding No cellular tissue based products placed in the center since last visit: Has Compression in Place as Prescribed: Yes Pain Present Now: No Electronic Signature(s) Signed: 11/28/2022 4:06:17 PM By: Blanche East RN Entered By: Blanche East on 11/28/2022 08:09:44 -------------------------------------------------------------------------------- Compression Therapy Details Patient Name: Date of Service: [name] 11/28/2022 8:00 A M Medical Record Number: [id number] Patient Account Number: [id number] Date of Birth/Sex: Treating RN: 1940-12-23 (82 y.o. [name] Primary Care Arihanna Estabrook: Terrill Mohr Other Clinician: Referring Elyn Krogh: Treating [name]/Extender: [name] in Treatment: 4 Compression Therapy Performed for Wound Assessment: Wound #1 Right,Anterior Lower Leg Performed By: Clinician Blanche East, RN Compression Type: Three Layer Post Procedure Diagnosis Same as Pre-procedure Electronic  Signature(s) Signed: 11/28/2022 4:06:17 PM By: Blanche East RN Entered By: Blanche East on 11/28/2022 08:33:32 Schneiderman, [name] ([id number]) [id number] Page 2 of 8 -------------------------------------------------------------------------------- Encounter Discharge Information Details Patient Name: Date of Service: [name] 11/28/2022 8:00 A M Medical Record Number: [id number] Patient Account Number: [id number] Date of Birth/Sex: Treating RN: [birth year] (82 y.o. [name] Primary Care Notnamed [name]: Terrill Mohr Other Clinician: Referring Sanyla Summey: Treating Kiyra Slaubaugh/Extender: [name] in Treatment: 4 Encounter Discharge Information Items Post Procedure Vitals Discharge Condition: Stable Temperature (F): 98.3 Ambulatory Status: Cane Pulse (bpm): 102 Discharge Destination: Home Respiratory Rate (breaths/min): 18 Transportation: Private Auto Blood Pressure (mmHg): 146/88 Accompanied By: [name] Schedule Follow-up Appointment: Yes Clinical Summary of Care: Electronic Signature(s) Signed: 11/28/2022 4:06:17 PM By: Blanche East RN Entered By: Blanche East on 11/28/2022 08:36:53 -------------------------------------------------------------------------------- Lower Extremity Assessment Details Patient Name: Date of Service: [name] 11/28/2022 8:00 A M Medical Record Number: [id number] Patient Account Number: [id number] Date of Birth/Sex: Treating RN: 06/26/1941 (82 y.o. [name] Primary Care [name]: Terrill Mohr Other Clinician: Referring [name]: Treating [name]/Extender: [name] in Treatment: 4 Edema Assessment Assessed: [Left: No] [Right: No] [Left: Edema] [Right: :] Calf Left: Right: Point of Measurement: 36 cm From Medial Instep 36 cm Ankle Left: Right: Point of Measurement: 10 cm From Medial Instep 24 cm Vascular Assessment Pulses: Dorsalis  Pedis Palpable: [Right:Yes] Electronic Signature(s) Signed: 11/28/2022 4:06:17 PM By: Blanche East RN Entered By: Blanche East on 11/28/2022 08:20:59 Multi Wound Chart Details -------------------------------------------------------------------------------- [name] ([id number]) [id number] Page 3 of 8 Patient Name: Date of Service: [name] 11/28/2022 8:00 A M Medical Record Number: [id number] Patient Account Number: [id number] Date of Birth/Sex: Treating RN: 1941/02/07 (82 y.o. F) Primary Care Taylen Osorto: Terrill Mohr Other Clinician: Referring [name]: Treating [name]/Extender: [name] in Treatment: 4 Vital Signs Height(in): 64 Pulse(bpm): 101 Weight(lbs): 180 Blood Pressure(mmHg): 146/88 Body Mass Index(BMI):  30.9 Temperature(F): 98.3 Respiratory Rate(breaths/min): 18 Wound Assessments Wound Number: 1 N/A N/A Photos: No Photos N/A N/A Right, Anterior Lower Leg N/A N/A Wound Location: Gradually Appeared N/A N/A Wounding Event: Venous Leg Ulcer N/A N/A Primary Etiology: Asthma, Hypertension, Osteoarthritis N/A N/A Comorbid History: 09/30/2022 N/A N/A Date Acquired: 4 N/A N/A [name] of Treatment: Open N/A N/A Wound Status: No N/A N/A Wound Recurrence: Yes N/A N/A Clustered Wound: 12x7x0.1 N/A N/A Measurements L x W x D (cm) 65.973 N/A N/A A (cm) : rea 6.597 N/A N/A Volume (cm) : -69.70% N/A N/A % Reduction in A rea: -69.70% N/A N/A % Reduction in Volume: Full Thickness Without Exposed N/A N/A Classification: Support Structures Large N/A N/A Exudate A mount: Serosanguineous N/A N/A Exudate Type: red, brown N/A N/A Exudate Color: Distinct, outline attached N/A N/A Wound Margin: Small (1-33%) N/A N/A Granulation A mount: Red, Hyper-granulation N/A N/A Granulation Quality: Large (67-100%) N/A N/A Necrotic A mount: Eschar, Adherent Slough N/A N/A Necrotic Tissue: Fat  Layer (Subcutaneous Tissue): Yes N/A N/A Exposed Structures: Fascia: No Tendon: No Muscle: No Joint: No Bone: No Small (1-33%) N/A N/A Epithelialization: Debridement - Excisional N/A N/A Debridement: Pre-procedure Verification/Time Out 08:29 N/A N/A Taken: Lidocaine 5% topical ointment N/A N/A Pain Control: Subcutaneous, Slough N/A N/A Tissue Debrided: Skin/Subcutaneous Tissue N/A N/A Level: 56 N/A N/A Debridement A (sq cm): rea Curette N/A N/A Instrument: Minimum N/A N/A Bleeding: Pressure N/A N/A Hemostasis A chieved: Procedure was tolerated well N/A N/A Debridement Treatment Response: 12x7x0.1 N/A N/A Post Debridement Measurements L x W x D (cm) 6.597 N/A N/A Post Debridement Volume: (cm) Scarring: Yes N/A N/A Periwound Skin Texture: Excoriation: No Maceration: No N/A N/A Periwound Skin Moisture: Dry/Scaly: No Hemosiderin Staining: Yes N/A N/A Periwound Skin Color: No Abnormality N/A N/A Temperature: Compression Therapy N/A N/A Procedures Performed: Debridement Treatment Notes Wound #1 (Lower Leg) Wound Laterality: Right, Anterior Cleanser Soap and Water Discharge Instruction: May shower and wash wound with dial antibacterial soap and water prior to dressing change. Wound Cleanser Discharge Instruction: Cleanse the wound with wound cleanser prior to applying a clean dressing using gauze sponges, not tissue or cotton balls. [name] ([id number]) [id number] Page 4 of 8 Peri-Wound Care Triamcinolone 15 (g) Discharge Instruction: Use triamcinolone 15 (g) as directed Sween Lotion (Moisturizing lotion) Discharge Instruction: Apply moisturizing lotion as directed Topical Mupirocin Ointment Discharge Instruction: Apply Mupirocin (Bactroban) as instructed Primary Dressing IODOFLEX 0.9% Cadexomer Iodine Pad 4x6 cm Discharge Instruction: Apply to wound bed as instructed Secondary Dressing Woven Gauze Sponge, Non-Sterile  4x4 in Discharge Instruction: Apply over primary dressing as directed. Zetuvit Plus 4x8 in Discharge Instruction: Apply over primary dressing as directed. Secured With Compression Wrap ThreePress (3 layer compression wrap) Discharge Instruction: Apply three layer compression as directed. Compression Stockings Add-Ons Electronic Signature(s) Signed: 11/28/2022 8:44:34 AM By: Duanne Guess MD FACS Entered By: Duanne Guess on 11/28/2022 08:44:34 -------------------------------------------------------------------------------- Multi-Disciplinary Care Plan Details Patient Name: Date of Service: PA DDISO [name] 11/28/2022 8:00 A M Medical Record Number: [id number] Patient Account Number: [id number] Date of Birth/Sex: Treating RN: 1941-02-24 (82 y.o. Kateri Mc Primary Care Daneesha Quinteros: Brett Fairy Other Clinician: Referring Ashby Leflore: Treating Barrett Goldie/Extender: [name] [name] in Treatment: 4 Active Inactive Wound/Skin Impairment Nursing Diagnoses: Impaired tissue integrity Goals: Patient/caregiver will verbalize understanding of skin care regimen Date Initiated: 10/30/2022 Target Resolution Date: 01/22/2023 Goal Status: Active Interventions: Assess ulceration(s) every visit Treatment Activities: Skin care regimen initiated : 10/30/2022 Notes: LEVIE, OWENSBY ([id number]) [id number].pdf Page  5 of 8 Electronic Signature(s) Signed: 11/28/2022 4:06:17 PM By: Tommie Ard RN Entered By: Tommie Ard on 11/28/2022 08:33:40 -------------------------------------------------------------------------------- Pain Assessment Details Patient Name: Date of Service: PA DDISO [name] 11/28/2022 8:00 A M Medical Record Number: [id number] Patient Account Number: [id number] Date of Birth/Sex: Treating RN: 06-Nov-1941 (82 y.o. Kateri Mc Primary Care Belmira Daley: Brett Fairy Other Clinician: Referring Rosetta Rupnow: Treating  Cecelia Graciano/Extender: [name] [name] in Treatment: 4 Active Problems Location of Pain Severity and Description of Pain Patient Has Paino No Site Locations Rate the pain. Current Pain Level: 0 Pain Management and Medication Current Pain Management: Electronic Signature(s) Signed: 11/28/2022 4:06:17 PM By: Tommie Ard RN Entered By: Tommie Ard on 11/28/2022 08:12:49 -------------------------------------------------------------------------------- Patient/Caregiver Education Details Patient Name: Date of Service: PA DDISO [name] 1/5/2024andnbsp8:00 A M Medical Record Number: [id number] Patient Account Number: [id number] Date of Birth/Gender: Treating RN: 05/04/1941 (82 y.o. Kateri Mc Primary Care Physician: Brett Fairy Other Clinician: Referring Physician: Treating Physician/Extender: [name] in Treatment: 4 Education Assessment Education Provided To: Patient Bayron, [name] ([id number]) [id number] Page 6 of 8 Education Topics Provided Infection: Methods: Explain/Verbal Responses: Reinforcements needed, State content correctly Wound Debridement: Methods: Explain/Verbal Responses: Reinforcements needed, State content correctly Wound/Skin Impairment: Methods: Explain/Verbal Responses: Reinforcements needed, State content correctly Electronic Signature(s) Signed: 11/28/2022 4:06:17 PM By: Tommie Ard RN Entered By: Tommie Ard on 11/28/2022 08:34:14 -------------------------------------------------------------------------------- Wound Assessment Details Patient Name: Date of Service: PA DDISO [name] 11/28/2022 8:00 A M Medical Record Number: [id number] Patient Account Number: [id number] Date of Birth/Sex: Treating RN: Apr 28, 1941 (82 y.o. Roselee Nova, [name] Primary Care Amyla Heffner: Brett Fairy Other Clinician: Referring Dayn Barich: Treating Jordi Kamm/Extender: [name] [name] in Treatment: 4 Wound Status Wound Number: 1 Primary Etiology: Venous Leg Ulcer Wound Location: Right, Anterior Lower Leg Wound Status: Open Wounding Event: Gradually Appeared Comorbid History: Asthma, Hypertension, Osteoarthritis Date Acquired: 09/30/2022 [name] Of Treatment: 4 Clustered Wound: Yes Wound Measurements Length: (cm) 12 Width: (cm) 7 Depth: (cm) 0.1 Area: (cm) 65.973 Volume: (cm) 6.597 % Reduction in Area: -69.7% % Reduction in Volume: -69.7% Epithelialization: Small (1-33%) Tunneling: No Undermining: No Wound Description Classification: Full Thickness Without Exposed Suppor Wound Margin: Distinct, outline attached Exudate Amount: Large Exudate Type: Serosanguineous Exudate Color: red, brown t Structures Foul Odor After Cleansing: No Slough/Fibrino Yes Wound Bed Granulation Amount: Small (1-33%) Exposed Structure Granulation Quality: Red, Hyper-granulation Fascia Exposed: No Necrotic Amount: Large (67-100%) Fat Layer (Subcutaneous Tissue) Exposed: Yes Necrotic Quality: Eschar, Adherent Slough Tendon Exposed: No Muscle Exposed: No Joint Exposed: No Bone Exposed: No Periwound Skin Texture Texture Color No Abnormalities Noted: No No Abnormalities Noted: No Excoriation: No Hemosiderin Staining: Yes Scarring: Yes Temperature / Pain Temperature: No Abnormality Moisture Williams, Jordie ([id number]) [id number] Page 7 of 8 No Abnormalities Noted: No Dry / Scaly: No Maceration: No Treatment Notes Wound #1 (Lower Leg) Wound Laterality: Right, Anterior Cleanser Soap and Water Discharge Instruction: May shower and wash wound with dial antibacterial soap and water prior to dressing change. Wound Cleanser Discharge Instruction: Cleanse the wound with wound cleanser prior to applying a clean dressing using gauze sponges, not tissue or cotton balls. Peri-Wound Care Triamcinolone 15 (g) Discharge  Instruction: Use triamcinolone 15 (g) as directed Sween Lotion (Moisturizing lotion) Discharge Instruction: Apply moisturizing lotion as directed Topical Mupirocin Ointment Discharge Instruction: Apply Mupirocin (Bactroban) as instructed Primary Dressing IODOFLEX 0.9% Cadexomer Iodine Pad 4x6 cm Discharge Instruction: Apply to wound bed as instructed Secondary Dressing Woven  Gauze Sponge, Non-Sterile 4x4 in Discharge Instruction: Apply over primary dressing as directed. Zetuvit Plus 4x8 in Discharge Instruction: Apply over primary dressing as directed. Secured With Compression Wrap ThreePress (3 layer compression wrap) Discharge Instruction: Apply three layer compression as directed. Compression Stockings Add-Ons Electronic Signature(s) Signed: 11/28/2022 4:06:17 PM By: Tommie Ard RN Entered By: Tommie Ard on 11/28/2022 08:21:51 -------------------------------------------------------------------------------- Vitals Details Patient Name: Date of Service: PA DDISO [name] 11/28/2022 8:00 A M Medical Record Number: [id number] Patient Account Number: [id number] Date of Birth/Sex: Treating RN: 29-May-1941 (82 y.o. Roselee Nova, [name] Primary Care Keira Bohlin: Brett Fairy Other Clinician: Referring Silver Achey: Treating Anab Vivar/Extender: [name] [name] in Treatment: 4 Vital Signs Time Taken: 08:09 Temperature (F): 98.3 Height (in): 64 Pulse (bpm): 101 Weight (lbs): 180 Respiratory Rate (breaths/min): 18 Body Mass Index (BMI): 30.9 Blood Pressure (mmHg): 146/88 Reference Range: 80 - 120 mg / dl Dikes, Darlyne ([id number]) [id number] Page 8 of 8 Electronic Signature(s) Signed: 11/28/2022 4:06:17 PM By: Tommie Ard RN Entered By: Tommie Ard on 11/28/2022 08:12:12

## 2022-12-04 ENCOUNTER — Encounter (HOSPITAL_BASED_OUTPATIENT_CLINIC_OR_DEPARTMENT_OTHER): Payer: Medicare Other | Admitting: General Surgery

## 2022-12-04 DIAGNOSIS — L97812 Non-pressure chronic ulcer of other part of right lower leg with fat layer exposed: Secondary | ICD-10-CM | POA: Diagnosis not present

## 2022-12-05 NOTE — Progress Notes (Signed)
[name] ([id number]) [id number] Page 1 of 9 Visit Report for 12/04/2022 Chief Complaint Document Details Patient Name: Date of Service: PA DDISO [name] [facility] 12/04/2022 12:30 PM Medical Record Number: [id number] Patient Account Number: [id number] Date of Birth/Sex: Treating RN: 06/12/1941 (82 y.o. F) Primary Care Provider: Brett Fairy Other Clinician: Referring Provider: Treating Provider/Extender: [name] in Treatment: 5 Information Obtained from: Patient Chief Complaint Patient presents for treatment of open ulcers due to venous insufficiency Electronic Signature(s) Signed: 12/04/2022 1:22:42 PM By: Duanne Guess MD FACS Entered By: Duanne Guess on 12/04/2022 13:22:42 -------------------------------------------------------------------------------- Debridement Details Patient Name: Date of Service: [name] 12/04/2022 12:30 PM Medical Record Number: [id number] Patient Account Number: [id number] Date of Birth/Sex: Treating RN: Apr 21, 1941 (81 y.o. [name] Primary Care Provider: Brett Fairy Other Clinician: Referring Provider: Treating Provider/Extender: [name] Weeks in Treatment: 5 Debridement Performed for Assessment: Wound #1 Right,Anterior Lower Leg Performed By: Physician Duanne Guess, MD Debridement Type: Debridement Severity of Tissue Pre Debridement: Fat layer exposed Level of Consciousness (Pre-procedure): Awake and Alert Pre-procedure Verification/Time Out Yes - 12:48 Taken: Start Time: 12:48 Pain Control: Lidocaine 5% topical ointment T Area Debrided (L x W): otal 5 (cm) x 3.5 (cm) = 17.5 (cm) Tissue and other material debrided: Non-Viable, Eschar, Slough, Subcutaneous, Slough, Other: AgAlg Level: Skin/Subcutaneous Tissue Debridement Description: Excisional Instrument: Curette Bleeding: Minimum Hemostasis Achieved: Pressure Response  to Treatment: Procedure was tolerated well Level of Consciousness (Post- Awake and Alert procedure): Post Debridement Measurements of Total Wound Length: (cm) 7.5 Width: (cm) 5.4 Depth: (cm) 0.1 Volume: (cm) 3.181 Character of Wound/Ulcer Post Debridement: Improved Severity of Tissue Post Debridement: Fat layer exposed Post Procedure Diagnosis Same as Pre-procedure Trinkle, [name] ([id number]) [id number] Page 2 of 9 Notes scribed for Dr. Lady Gary by Samuella Bruin, RN Electronic Signature(s) Signed: 12/04/2022 2:57:10 PM By: Duanne Guess MD FACS Signed: 12/04/2022 3:59:24 PM By: Samuella Bruin Entered By: Samuella Bruin on 12/04/2022 12:50:43 -------------------------------------------------------------------------------- HPI Details Patient Name: Date of Service: [name] 12/04/2022 12:30 PM Medical Record Number: [id number] Patient Account Number: [id number] Date of Birth/Sex: Treating RN: 1941-02-26 (82 y.o. 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 The patient and her family elected for comfort care however somehow she managed to heal her wounds. From the electronic medical record, it looks like she was receiving home health services and being followed by her primary care provider. At the beginning of November, she saw her PCP again and had new ulcers on her right leg. Home health services were not available to assist the patient and she was referred to the wound care center for further  evaluation and management. On the right anterior lower leg, she has multiple ulcers of varying degrees and depths. Some are limited to breakdown of skin and others are deeper, into the fat layer. There is slough and some eschar accumulation. She has periwound erythema and 1-2+ edema. 12/14-second visit for this woman who arrived to clinic accompanied by a neighbor. 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 She tells me that in 2021 she had bilateral lower leg  wounds which took an ordinarily long period of time to healo 2 years. The current wounds have been on the right leg for about 2 months. She may or may not have been wearing a support stocking on the right leg. She has not been using anything on the left but fortunately does not have any wounds there. She makes it clear to me that she cannot get standard over the toe stockings on herself. Her husband passed away sometime earlier this year I believe 11/13/2022: All of the wounds seem to have progressed, getting larger and at this point and the fat layer is exposed in all locations. There is slough accumulation on all surfaces. 11/20/2022: Her wounds continue to worsen. They have expanded and there is thick slough on all of the wound surfaces. She is experiencing more pain. 11/28/2022: The cultures that I took last week grew out methicillin-resistant Staph aureus so we discontinued Augmentin and initiated doxycycline. She is currently taking this. Her wounds are less painful today. They are all about the same size, but they have not expanded. There is rubbery slough on the wound surfaces. 12/04/2022: The wounds look improved today after being on doxycycline and using topical mupirocin. There is still a fair amount of slough accumulation. Electronic Signature(s) Signed: 12/04/2022 1:23:33 PM By: Fredirick Maudlin MD FACS Entered By: Fredirick Maudlin on 12/04/2022  13:23:33 -------------------------------------------------------------------------------- Physical Exam Details Patient Name: Date of Service: [name] 12/04/2022 12:30 PM Medical Record Number: [id number] Patient Account Number: [id number] Date of Birth/Sex: Treating RN: 12-25-1940 (82 y.o. F) Primary Care Provider: Terrill Mohr Other Clinician: Referring Provider: Treating Provider/Extender: Renaldo Harrison in Treatment: 5 Koeneman, [name] ([id number]) [id number] Page 3 of 9 Constitutional Slightly hypertensive. . . . no acute distress. Respiratory Normal work of breathing on room air. Notes 12/04/2022: The wounds look improved today after being on doxycycline and using topical mupirocin. There is still a fair amount of slough accumulation. Electronic Signature(s) Signed: 12/04/2022 1:24:09 PM By: Fredirick Maudlin MD FACS Entered By: Fredirick Maudlin on 12/04/2022 13:24:09 -------------------------------------------------------------------------------- Physician Orders Details Patient Name: Date of Service: [name] 12/04/2022 12:30 PM Medical Record Number: [id number] Patient Account Number: [id number] Date of Birth/Sex: Treating RN: 1941/05/04 (81 y.o. Harlow Ohms Primary Care Provider: Terrill Mohr Other Clinician: Referring Provider: Treating Provider/Extender: Serita Kyle Weeks in Treatment: 5 Verbal / Phone Orders: No Diagnosis Coding ICD-10 Coding Code Description [phone] Non-pressure chronic ulcer of other part of right lower leg with fat layer exposed I87.2 Venous insufficiency (chronic) (peripheral) J44.89 Other specified chronic obstructive pulmonary disease I73.9 Peripheral vascular disease, unspecified Follow-up Appointments ppointment in 1 week. - Dr. Celine Ahr - Room 3 Return A Anesthetic (In clinic) Topical Lidocaine 5% applied to wound bed Bathing/  Shower/ Hygiene Other Bathing/Shower/Hygiene Orders/Instructions: - May take a sponge bath Edema Control - Lymphedema / SCD / Other Elevate legs to the level of the heart or above for 30 minutes daily and/or when sitting for 3-4 times a day throughout the day. Avoid standing for long periods of time. Exercise regularly Moisturize legs daily. - Use lotion on left leg-STOP using the steroid cream Wound Treatment Wound #1 - Lower Leg Wound Laterality: Right, Anterior Cleanser: Soap and Water 1 x Per Week/30 Days Discharge Instructions: May shower and wash wound with dial antibacterial soap and water prior to dressing change. Cleanser: Wound Cleanser 1 x Per Week/30 Days Discharge Instructions: Cleanse the wound with wound cleanser prior to applying  a clean dressing using gauze sponges, not tissue or cotton balls. Peri-Wound Care: Triamcinolone 15 (g) 1 x Per Week/30 Days Discharge Instructions: Use triamcinolone 15 (g) as directed Peri-Wound Care: Sween Lotion (Moisturizing lotion) 1 x Per Week/30 Days Discharge Instructions: Apply moisturizing lotion as directed Topical: Mupirocin Ointment 1 x Per Week/30 Days Discharge Instructions: Apply Mupirocin (Bactroban) as instructed Prim Dressing: Sorbalgon AG Dressing, 4x4 (in/in) 1 x Per Week/30 Days ary Discharge Instructions: Apply to wound bed as instructed Doble, [name] ([id number]) [id number] Page 4 of 9 Secondary Dressing: Woven Gauze Sponge, Non-Sterile 4x4 in 1 x Per Week/30 Days Discharge Instructions: Apply over primary dressing as directed. Secondary Dressing: Zetuvit Plus 4x8 in 1 x Per Week/30 Days Discharge Instructions: Apply over primary dressing as directed. Compression Wrap: ThreePress (3 layer compression wrap) 1 x Per Week/30 Days Discharge Instructions: Apply three layer compression as directed. Patient Medications llergies: meloxicam A Notifications Medication Indication Start  End 12/04/2022 lidocaine DOSE topical 5 % ointment - ointment topical Electronic Signature(s) Signed: 12/04/2022 2:57:10 PM By: Fredirick Maudlin MD FACS Entered By: Fredirick Maudlin on 12/04/2022 13:24:44 -------------------------------------------------------------------------------- Problem List Details Patient Name: Date of Service: [name] 12/04/2022 12:30 PM Medical Record Number: [id number] Patient Account Number: [id number] Date of Birth/Sex: Treating RN: 10-03-1941 (81 y.o. F) Primary Care Provider: Terrill Mohr Other Clinician: Referring Provider: Treating Provider/Extender: Serita Kyle Weeks in Treatment: 5 Active Problems ICD-10 Encounter Code Description Active Date MDM Diagnosis [phone] Non-pressure chronic ulcer of other part of right lower leg with fat layer 10/30/2022 No Yes exposed I87.2 Venous insufficiency (chronic) (peripheral) 10/30/2022 No Yes J44.89 Other specified chronic obstructive pulmonary disease 10/30/2022 No Yes I73.9 Peripheral vascular disease, unspecified 10/30/2022 No Yes Inactive Problems Resolved Problems Electronic Signature(s) Signed: 12/04/2022 1:14:04 PM By: Fredirick Maudlin MD FACS Entered By: Fredirick Maudlin on 12/04/2022 13:14:04 Villeda, [name] ([id number]) [id number] Page 5 of 9 -------------------------------------------------------------------------------- Progress Note Details Patient Name: Date of Service: [name] 12/04/2022 12:30 PM Medical Record Number: [id number] Patient Account Number: [id number] Date of Birth/Sex: Treating RN: November 26, 1940 (82 y.o. 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 The patient and her family elected for comfort care however somehow she managed to heal her wounds. From the electronic medical record, it looks like she was receiving home health services and being followed by her primary care provider. At the beginning of November, she saw her PCP again and had new ulcers on her right leg. Home health services were not available to assist the patient and she was referred to the wound care center for further evaluation and management. On the right anterior lower leg, she has multiple ulcers of varying degrees and depths. Some are limited to breakdown of skin and others are deeper, into the fat layer. There is slough and some eschar accumulation. She has periwound erythema and 1-2+ edema. 12/14-second visit for this woman who arrived to clinic accompanied by a neighbor. 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 She tells me that in  2021 she had bilateral lower leg wounds which took an ordinarily long period of time to healo 2 years. The current wounds have been on the right leg for about 2 months. She may or may not have been wearing a support stocking on the right leg. She has not been using anything on the left but fortunately does not have any wounds there. She makes it clear to me that she cannot get standard over the toe stockings on herself. Her husband passed away sometime earlier this year I believe 11/13/2022: All of the wounds seem to have progressed, getting larger and at this point and the fat layer is exposed in all locations. There is slough accumulation on all surfaces. 11/20/2022: Her wounds continue to worsen. They have  expanded and there is thick slough on all of the wound surfaces. She is experiencing more pain. 11/28/2022: The cultures that I took last week grew out methicillin-resistant Staph aureus so we discontinued Augmentin and initiated doxycycline. She is currently taking this. Her wounds are less painful today. They are all about the same size, but they have not expanded. There is rubbery slough on the wound surfaces. 12/04/2022: The wounds look improved today after being on doxycycline and using topical mupirocin. There is still a fair amount of slough accumulation. Patient History Information obtained from Patient. Social History Never smoker, Marital Status - Widowed, Alcohol Use - Never, Drug Use - No History, Caffeine Use - Daily. Medical History Respiratory Patient has history of Asthma Cardiovascular Patient has history of Hypertension Musculoskeletal Patient has history of Osteoarthritis Hospitalization/Surgery History - Bilateral Cataract Extraction; Bone Graft-jaw; Arm Surgery (d/t dog bite). Medical A Surgical History Notes nd Eyes Hx: Cataracts Ear/Nose/Mouth/Throat Hx: wearing Bilateral hearing aids Genitourinary Chronic Renal Impairment Stage 3 Psychiatric Hx: Depression Tippin, Ryanna ([id number]) [id number] Page 6 of 9 Objective Constitutional Slightly hypertensive. no acute distress. Vitals Time Taken: 12:36 PM, Height: 64 in, Weight: 180 lbs, BMI: 30.9, Temperature: 98.4 F, Pulse: 95 bpm, Respiratory Rate: 18 breaths/min, Blood Pressure: 147/87 mmHg. Respiratory Normal work of breathing on room air. General Notes: 12/04/2022: The wounds look improved today after being on doxycycline and using topical mupirocin. There is still a fair amount of slough accumulation. Integumentary (Hair, Skin) Wound #1 status is Open. Original cause of wound was Gradually Appeared. The date acquired was: 09/30/2022. The wound has been in treatment 5 weeks.  The wound is located on the Right,Anterior Lower Leg. The wound measures 7.5cm length x 5.4cm width x 0.1cm depth; 31.809cm^2 area and 3.181cm^3 volume. There is Fat Layer (Subcutaneous Tissue) exposed. There is no tunneling or undermining noted. There is a large amount of serosanguineous drainage noted. The wound margin is distinct with the outline attached to the wound base. There is medium (34-66%) red, hyper - granulation within the wound bed. There is a medium (34-66%) amount of necrotic tissue within the wound bed including Eschar and Adherent Slough. The periwound skin appearance exhibited: Scarring, Hemosiderin Staining. The periwound skin appearance did not exhibit: Excoriation, Dry/Scaly, Maceration. Periwound temperature was noted as No Abnormality. Assessment Active Problems ICD-10 Non-pressure chronic ulcer of other part of right lower leg with fat layer exposed Venous insufficiency (chronic) (peripheral) Other specified chronic obstructive pulmonary disease Peripheral vascular disease, unspecified Procedures Wound #1 Pre-procedure diagnosis of Wound #1 is a Venous Leg Ulcer located on the Right,Anterior Lower Leg .Severity of Tissue Pre Debridement is: Fat layer exposed. There was a Excisional Skin/Subcutaneous Tissue Debridement with a total area of 17.5 sq  cm performed by Duanne Guess, MD. With the following instrument(s): Curette to remove Non-Viable tissue/material. Material removed includes Eschar, Subcutaneous Tissue, Slough, and Other: AgAlg after achieving pain control using Lidocaine 5% topical ointment. No specimens were taken. A time out was conducted at 12:48, prior to the start of the procedure. A Minimum amount of bleeding was controlled with Pressure. The procedure was tolerated well. Post Debridement Measurements: 7.5cm length x 5.4cm width x 0.1cm depth; 3.181cm^3 volume. Character of Wound/Ulcer Post Debridement is improved. Severity of Tissue Post Debridement  is: Fat layer exposed. Post procedure Diagnosis Wound #1: Same as Pre-Procedure General Notes: scribed for Dr. Lady Gary by Samuella Bruin, RN. Plan Follow-up Appointments: Return Appointment in 1 week. - Dr. Lady Gary - Room 3 Anesthetic: (In clinic) Topical Lidocaine 5% applied to wound bed Bathing/ Shower/ Hygiene: Other Bathing/Shower/Hygiene Orders/Instructions: - May take a sponge bath Edema Control - Lymphedema / SCD / Other: Elevate legs to the level of the heart or above for 30 minutes daily and/or when sitting for 3-4 times a day throughout the day. Avoid standing for long periods of time. Exercise regularly Moisturize legs daily. - Use lotion on left leg-STOP using the steroid cream The following medication(s) was prescribed: lidocaine topical 5 % ointment ointment topical was prescribed at facility WOUND #1: - Lower Leg Wound Laterality: Right, Anterior Cleanser: Soap and Water 1 x Per Week/30 Days Discharge Instructions: May shower and wash wound with dial antibacterial soap and water prior to dressing change. Cleanser: Wound Cleanser 1 x Per Week/30 Days Discharge Instructions: Cleanse the wound with wound cleanser prior to applying a clean dressing using gauze sponges, not tissue or cotton balls. Peri-Wound Care: Triamcinolone 15 (g) 1 x Per Week/30 Days Discharge Instructions: Use triamcinolone 15 (g) as directed Peri-Wound Care: Sween Lotion (Moisturizing lotion) 1 x Per Week/30 Days Discharge Instructions: Apply moisturizing lotion as directed Topical: Mupirocin Ointment 1 x Per Week/30 Days Discharge Instructions: Apply Mupirocin (Bactroban) as instructed Greenhouse, [name] ([id number]) [id number] Page 7 of 9 Prim Dressing: Sorbalgon AG Dressing, 4x4 (in/in) 1 x Per Week/30 Days ary Discharge Instructions: Apply to wound bed as instructed Secondary Dressing: Woven Gauze Sponge, Non-Sterile 4x4 in 1 x Per Week/30 Days Discharge Instructions:  Apply over primary dressing as directed. Secondary Dressing: Zetuvit Plus 4x8 in 1 x Per Week/30 Days Discharge Instructions: Apply over primary dressing as directed. Com pression Wrap: ThreePress (3 layer compression wrap) 1 x Per Week/30 Days Discharge Instructions: Apply three layer compression as directed. 12/04/2022: The wounds look improved today after being on doxycycline and using topical mupirocin. There is still a fair amount of slough accumulation. I used a curette to debride slough, eschar, and nonviable subcutaneous tissue from her wounds. We will continue to use topical mupirocin with silver alginate and 3 layer compression. She will complete her course of oral doxycycline. Follow-up in 1 week. Electronic Signature(s) Signed: 12/04/2022 1:25:18 PM By: Duanne Guess MD FACS Entered By: Duanne Guess on 12/04/2022 13:25:18 -------------------------------------------------------------------------------- HxROS Details Patient Name: Date of Service: [name] 12/04/2022 12:30 PM Medical Record Number: [id number] Patient Account Number: [id number] Date of Birth/Sex: Treating RN: [birth year] (81 y.o. F) Primary Care Provider: Brett Fairy Other Clinician: Referring Provider: Treating Provider/Extender: [name] in Treatment: 5 Information Obtained From Patient Eyes Medical History: Past Medical History Notes: Hx: Cataracts Ear/Nose/Mouth/Throat Medical History: Past Medical History Notes: Hx: wearing Bilateral hearing aids Respiratory Medical History: Positive for: Asthma Cardiovascular Medical History: Positive for: Hypertension Genitourinary  Medical History: Past Medical History Notes: Chronic Renal Impairment Stage 3 Musculoskeletal Medical History: Positive for: Osteoarthritis Psychiatric Medical History: Past Medical History NotesGENISIS, SONNIER ([id number]) [id number] Page 8 of  9 Hx: Depression Immunizations Pneumococcal Vaccine: Received Pneumococcal Vaccination: Yes Received Pneumococcal Vaccination On or After 60th Birthday: Yes Implantable Devices None Hospitalization / Surgery History Type of Hospitalization/Surgery Bilateral Cataract Extraction; Bone Graft-jaw; Arm Surgery (d/t dog bite) Family and Social History Never smoker; Marital Status - Widowed; Alcohol Use: Never; Drug Use: No History; Caffeine Use: Daily; Financial Concerns: No; Food, Clothing or Shelter Needs: No; Support System Lacking: No; Transportation Concerns: No Electronic Signature(s) Signed: 12/04/2022 2:57:10 PM By: Duanne Guess MD FACS Entered By: Duanne Guess on 12/04/2022 13:23:43 -------------------------------------------------------------------------------- SuperBill Details Patient Name: Date of Service: [name] 12/04/2022 Medical Record Number: [id number] Patient Account Number: [id number] Date of Birth/Sex: Treating RN: 1940-11-26 (81 y.o. F) Primary Care Provider: Brett Fairy Other Clinician: Referring Provider: Treating Provider/Extender: [name] Weeks in Treatment: 5 Diagnosis Coding ICD-10 Codes Code Description [phone] Non-pressure chronic ulcer of other part of right lower leg with fat layer exposed I87.2 Venous insufficiency (chronic) (peripheral) J44.89 Other specified chronic obstructive pulmonary disease I73.9 Peripheral vascular disease, unspecified Facility Procedures : CPT4 Code: [id number] Description: 11042 - DEB SUBQ TISSUE 20 SQ CM/< ICD-10 Diagnosis Description L97.812 Non-pressure chronic ulcer of other part of right lower leg with fat layer exp Modifier: osed Quantity: 1 Physician Procedures : CPT4 Code Description Modifier [id number] 99214 - WC PHYS LEVEL 4 - EST PT 25 ICD-10 Diagnosis Description L97.812 Non-pressure chronic ulcer of other part of right lower leg with fat layer exposed I87.2 Venous  insufficiency (chronic) (peripheral) J44.89  Other specified chronic obstructive pulmonary disease I73.9 Peripheral vascular disease, unspecified Quantity: 1 : [id number] 11042 - WC PHYS SUBQ TISS 20 SQ CM ICD-10 Diagnosis Description L97.812 Non-pressure chronic ulcer of other part of right lower leg with fat layer exposed Norrod, Matea ([id number]) [id number] Quantity: 1 Page 9 of 9 Electronic Signature(s) Signed: 12/04/2022 1:25:35 PM By: Duanne Guess MD FACS Entered By: Duanne Guess on 12/04/2022 13:25:34

## 2022-12-05 NOTE — Progress Notes (Signed)
[name] ([id number]) [id number] Page 1 of 7 Visit Report for 12/04/2022 Arrival Information Details Patient Name: Date of Service: PA DDISO [name] [facility] 12/04/2022 12:30 PM Medical Record Number: [id number] Patient Account Number: [id number] Date of Birth/Sex: Treating RN: 07-03-1941 (82 y.o. Harlow Ohms Primary Care Mearl Harewood: Terrill Mohr Other Clinician: Referring Kallista Pae: Treating Eldridge Marcott/Extender: Renaldo Harrison in Treatment: 5 Visit Information History Since Last Visit Added or deleted any medications: No Patient Arrived: Ambulatory Any new allergies or adverse reactions: No Arrival Time: 12:33 Had a fall or experienced change in No Accompanied By: friend activities of daily living that may affect Transfer Assistance: None risk of falls: Patient Identification Verified: Yes Signs or symptoms of abuse/neglect since last visito No Secondary Verification Process Completed: Yes Hospitalized since last visit: No Implantable device outside of the clinic excluding No cellular tissue based products placed in the center since last visit: Has Dressing in Place as Prescribed: Yes Has Compression in Place as Prescribed: Yes Pain Present Now: No Electronic Signature(s) Signed: 12/04/2022 3:59:24 PM By: Adline Peals Entered By: Adline Peals on 12/04/2022 12:33:54 -------------------------------------------------------------------------------- Encounter Discharge Information Details Patient Name: Date of Service: PA DDISO [name] 12/04/2022 12:30 PM Medical Record Number: [id number] Patient Account Number: [id number] Date of Birth/Sex: Treating RN: 09-Jan-1941 (81 y.o. Harlow Ohms Primary Care Bergen Magner: Terrill Mohr Other Clinician: Referring Subhan Hoopes: Treating Trinady Milewski/Extender: Serita Kyle Weeks in Treatment: 5 Encounter Discharge Information Items Post Procedure  Vitals Discharge Condition: Stable Temperature (F): 98.4 Ambulatory Status: Cane Pulse (bpm): 95 Discharge Destination: Home Respiratory Rate (breaths/min): 18 Transportation: Private Auto Blood Pressure (mmHg): 147/87 Accompanied By: friend Schedule Follow-up Appointment: Yes Clinical Summary of Care: Patient Declined Electronic Signature(s) Signed: 12/04/2022 3:59:24 PM By: Adline Peals Entered By: Adline Peals on 12/04/2022 12:52:45 Markwell, [name] ([id number]) [id number] Page 2 of 7 -------------------------------------------------------------------------------- Lower Extremity Assessment Details Patient Name: Date of Service: PA DDISO [name] 12/04/2022 12:30 PM Medical Record Number: [id number] Patient Account Number: [id number] Date of Birth/Sex: Treating RN: June 18, 1941 (82 y.o. Harlow Ohms Primary Care Aiyanah Kalama: Terrill Mohr Other Clinician: Referring Shakita Keir: Treating Renie Stelmach/Extender: Serita Kyle Weeks in Treatment: 5 Edema Assessment Assessed: [Left: No] [Right: No] [Left: Edema] [Right: :] Calf Left: Right: Point of Measurement: 36 cm From Medial Instep 38.8 cm Ankle Left: Right: Point of Measurement: 10 cm From Medial Instep 24.8 cm Vascular Assessment Pulses: Dorsalis Pedis Palpable: [Right:Yes] Electronic Signature(s) Signed: 12/04/2022 3:59:24 PM By: Adline Peals Entered By: Adline Peals on 12/04/2022 12:42:20 -------------------------------------------------------------------------------- Multi Wound Chart Details Patient Name: Date of Service: PA DDISO [name] 12/04/2022 12:30 PM Medical Record Number: [id number] Patient Account Number: [id number] Date of Birth/Sex: Treating RN: May 11, 1941 (81 y.o. F) Primary Care Emine Lopata: Terrill Mohr Other Clinician: Referring Gagan Dillion: Treating Mckenzye Cutright/Extender: Serita Kyle Weeks in Treatment:  5 Vital Signs Height(in): 64 Pulse(bpm): 95 Weight(lbs): 180 Blood Pressure(mmHg): 147/87 Body Mass Index(BMI): 30.9 Temperature(F): 98.4 Respiratory Rate(breaths/min): 18 [1:Photos:] [N/A:N/A] Right, Anterior Lower Leg N/A N/A Wound Location: Gradually Appeared N/A N/A Wounding Event: Venous Leg Ulcer N/A N/A Primary Etiology: Asthma, Hypertension, Osteoarthritis N/A N/A Comorbid History: 09/30/2022 N/A N/A Date Acquired: 5 N/A N/A Weeks of Treatment: Open N/A N/A Wound Status: No N/A N/A Wound Recurrence: Yes N/A N/A Clustered Wound: 7.5x5.4x0.1 N/A N/A Measurements L x W x D (cm) 31.809 N/A N/A A (cm) : rea 3.181 N/A N/A Volume (cm) : 18.20% N/A N/A % Reduction in A rea: 18.20% N/A N/A %  Reduction in Volume: Full Thickness Without Exposed N/A N/A Classification: Support Structures Large N/A N/A Exudate A mount: Serosanguineous N/A N/A Exudate Type: red, brown N/A N/A Exudate Color: Distinct, outline attached N/A N/A Wound Margin: Medium (34-66%) N/A N/A Granulation A mount: Red, Hyper-granulation N/A N/A Granulation Quality: Medium (34-66%) N/A N/A Necrotic A mount: Eschar, Adherent Slough N/A N/A Necrotic Tissue: Fat Layer (Subcutaneous Tissue): Yes N/A N/A Exposed Structures: Fascia: No Tendon: No Muscle: No Joint: No Bone: No Small (1-33%) N/A N/A Epithelialization: Debridement - Excisional N/A N/A Debridement: Pre-procedure Verification/Time Out 12:48 N/A N/A Taken: Lidocaine 5% topical ointment N/A N/A Pain Control: Necrotic/Eschar, Other, Subcutaneous, N/A N/A Tissue Debrided: Slough Skin/Subcutaneous Tissue N/A N/A Level: 17.5 N/A N/A Debridement A (sq cm): rea Curette N/A N/A Instrument: Minimum N/A N/A Bleeding: Pressure N/A N/A Hemostasis Achieved: Debridement Treatment Response: Procedure was tolerated well N/A N/A Post Debridement Measurements L x 7.5x5.4x0.1 N/A N/A W x D (cm) 3.181 N/A N/A Post Debridement  Volume: (cm) Scarring: Yes N/A N/A Periwound Skin Texture: Excoriation: No Maceration: No N/A N/A Periwound Skin Moisture: Dry/Scaly: No Hemosiderin Staining: Yes N/A N/A Periwound Skin Color: No Abnormality N/A N/A Temperature: Debridement N/A N/A Procedures Performed: Treatment Notes Wound #1 (Lower Leg) Wound Laterality: Right, Anterior Cleanser Soap and Water Discharge Instruction: May shower and wash wound with dial antibacterial soap and water prior to dressing change. Wound Cleanser Discharge Instruction: Cleanse the wound with wound cleanser prior to applying a clean dressing using gauze sponges, not tissue or cotton balls. Peri-Wound Care Triamcinolone 15 (g) Discharge Instruction: Use triamcinolone 15 (g) as directed Sween Lotion (Moisturizing lotion) Discharge Instruction: Apply moisturizing lotion as directed Topical Mupirocin Ointment Discharge Instruction: Apply Mupirocin (Bactroban) as instructed Primary Dressing Sorbalgon AG Dressing, 4x4 (in/in) Discharge Instruction: Apply to wound bed as instructed Secondary Dressing Woven Gauze Sponge, Non-Sterile 4x4 in Hollenbaugh, [name] ([id number]) [id number] Page 4 of 7 Discharge Instruction: Apply over primary dressing as directed. Zetuvit Plus 4x8 in Discharge Instruction: Apply over primary dressing as directed. Secured With Compression Wrap ThreePress (3 layer compression wrap) Discharge Instruction: Apply three layer compression as directed. Compression Stockings Add-Ons Electronic Signature(s) Signed: 12/04/2022 1:22:16 PM By: Duanne Guess MD FACS Entered By: Duanne Guess on 12/04/2022 13:22:16 -------------------------------------------------------------------------------- Multi-Disciplinary Care Plan Details Patient Name: Date of Service: PA DDISO [name] 12/04/2022 12:30 PM Medical Record Number: [id number] Patient Account Number: [id number] Date of Birth/Sex:  Treating RN: 1941/09/11 (81 y.o. [name] Primary Care Brendyn Mclaren: Brett Fairy Other Clinician: Referring Markell Schrier: Treating Kylian Loh/Extender: [name] Weeks in Treatment: 5 Active Inactive Wound/Skin Impairment Nursing Diagnoses: Impaired tissue integrity Goals: Patient/caregiver will verbalize understanding of skin care regimen Date Initiated: 10/30/2022 Target Resolution Date: 01/22/2023 Goal Status: Active Interventions: Assess ulceration(s) every visit Treatment Activities: Skin care regimen initiated : 10/30/2022 Notes: Electronic Signature(s) Signed: 12/04/2022 3:59:24 PM By: Samuella Bruin Entered By: Samuella Bruin on 12/04/2022 12:51:44 -------------------------------------------------------------------------------- Pain Assessment Details Patient Name: Date of Service: PA DDISO [name] 12/04/2022 12:30 PM Medical Record Number: [id number] Patient Account Number: [id number] Date of Birth/Sex: Treating RN: January 24, 1941 (82 y.o. Caro Hight, Seabron Spates, [name] ([id number]) [id number] Page 5 of 7 Primary Care Scout Gumbs: Brett Fairy Other Clinician: Referring Taiquan Campanaro: Treating Elyon Zoll/Extender: [name] Weeks in Treatment: 5 Active Problems Location of Pain Severity and Description of Pain Patient Has Paino No Site Locations Rate the pain. Current Pain Level: 0 Pain Management and Medication Current Pain Management: Electronic Signature(s) Signed: 12/04/2022 3:59:24 PM By:  Samuella Bruin Entered By: Samuella Bruin on 12/04/2022 12:34:01 -------------------------------------------------------------------------------- Patient/Caregiver Education Details Patient Name: Date of Service: PA DDISO [name] 1/11/2024andnbsp12:30 PM Medical Record Number: [id number] Patient Account Number: [id number] Date of Birth/Gender: Treating RN: 1940-12-08 (82 y.o. [name] Primary Care Physician: Brett Fairy Other Clinician: Referring Physician: Treating Physician/Extender: [name] in Treatment: 5 Education Assessment Education Provided To: Patient Education Topics Provided Wound Debridement: Methods: Explain/Verbal Responses: Reinforcements needed, State content correctly Electronic Signature(s) Signed: 12/04/2022 3:59:24 PM By: Samuella Bruin Entered By: Samuella Bruin on 12/04/2022 12:52:09 Demas, [name] ([id number]) [id number] Page 6 of 7 -------------------------------------------------------------------------------- Wound Assessment Details Patient Name: Date of Service: PA DDISO [name] 12/04/2022 12:30 PM Medical Record Number: [id number] Patient Account Number: [id number] Date of Birth/Sex: Treating RN: February 18, 1941 (82 y.o. [name] Primary Care Sabah Zucco: Brett Fairy Other Clinician: Referring Denasia Venn: Treating Chaka Boyson/Extender: [name] Weeks in Treatment: 5 Wound Status Wound Number: 1 Primary Etiology: Venous Leg Ulcer Wound Location: Right, Anterior Lower Leg Wound Status: Open Wounding Event: Gradually Appeared Comorbid History: Asthma, Hypertension, Osteoarthritis Date Acquired: 09/30/2022 Weeks Of Treatment: 5 Clustered Wound: Yes Photos Wound Measurements Length: (cm) 7.5 Width: (cm) 5.4 Depth: (cm) 0.1 Area: (cm) 31.809 Volume: (cm) 3.181 % Reduction in Area: 18.2% % Reduction in Volume: 18.2% Epithelialization: Small (1-33%) Tunneling: No Undermining: No Wound Description Classification: Full Thickness Without Exposed Suppor Wound Margin: Distinct, outline attached Exudate Amount: Large Exudate Type: Serosanguineous Exudate Color: red, brown t Structures Foul Odor After Cleansing: No Slough/Fibrino Yes Wound Bed Granulation Amount: Medium (34-66%) Exposed  Structure Granulation Quality: Red, Hyper-granulation Fascia Exposed: No Necrotic Amount: Medium (34-66%) Fat Layer (Subcutaneous Tissue) Exposed: Yes Necrotic Quality: Eschar, Adherent Slough Tendon Exposed: No Muscle Exposed: No Joint Exposed: No Bone Exposed: No Periwound Skin Texture Texture Color No Abnormalities Noted: No No Abnormalities Noted: No Excoriation: No Hemosiderin Staining: Yes Scarring: Yes Temperature / Pain Temperature: No Abnormality Moisture No Abnormalities Noted: No Dry / Scaly: No Maceration: No Treatment Notes Wound #1 (Lower Leg) Wound Laterality: Right, Anterior Villatoro, Elaura ([id number]) [id number] Page 7 of 7 Cleanser Soap and Water Discharge Instruction: May shower and wash wound with dial antibacterial soap and water prior to dressing change. Wound Cleanser Discharge Instruction: Cleanse the wound with wound cleanser prior to applying a clean dressing using gauze sponges, not tissue or cotton balls. Peri-Wound Care Triamcinolone 15 (g) Discharge Instruction: Use triamcinolone 15 (g) as directed Sween Lotion (Moisturizing lotion) Discharge Instruction: Apply moisturizing lotion as directed Topical Mupirocin Ointment Discharge Instruction: Apply Mupirocin (Bactroban) as instructed Primary Dressing Sorbalgon AG Dressing, 4x4 (in/in) Discharge Instruction: Apply to wound bed as instructed Secondary Dressing Woven Gauze Sponge, Non-Sterile 4x4 in Discharge Instruction: Apply over primary dressing as directed. Zetuvit Plus 4x8 in Discharge Instruction: Apply over primary dressing as directed. Secured With Compression Wrap ThreePress (3 layer compression wrap) Discharge Instruction: Apply three layer compression as directed. Compression Stockings Add-Ons Electronic Signature(s) Signed: 12/04/2022 3:59:24 PM By: Samuella Bruin Entered By: Samuella Bruin on 12/04/2022  12:44:53 -------------------------------------------------------------------------------- Vitals Details Patient Name: Date of Service: PA DDISO [name] 12/04/2022 12:30 PM Medical Record Number: [id number] Patient Account Number: [id number] Date of Birth/Sex: Treating RN: 23-Mar-1941 (82 y.o. [name] Primary Care Eriel Dunckel: Brett Fairy Other Clinician: Referring Janetta Vandoren: Treating Aldred Mase/Extender: [name] Weeks in Treatment: 5 Vital Signs Time Taken: 12:36 Temperature (F): 98.4 Height (in): 64 Pulse (bpm): 95 Weight (lbs): 180 Respiratory Rate (breaths/min): 18 Body  Mass Index (BMI): 30.9 Blood Pressure (mmHg): 147/87 Reference Range: 80 - 120 mg / dl Electronic Signature(s) Signed: 12/04/2022 3:59:24 PM By: Adline Peals Entered By: Adline Peals on 12/04/2022 12:36:56

## 2022-12-11 ENCOUNTER — Encounter (HOSPITAL_BASED_OUTPATIENT_CLINIC_OR_DEPARTMENT_OTHER): Payer: Medicare Other | Admitting: General Surgery

## 2022-12-11 DIAGNOSIS — L97812 Non-pressure chronic ulcer of other part of right lower leg with fat layer exposed: Secondary | ICD-10-CM | POA: Diagnosis not present

## 2022-12-11 NOTE — Progress Notes (Signed)
[name] ([id number]) [id number] Page 1 of 7 Visit Report for 12/11/2022 Arrival Information Details Patient Name: Date of Service: PA DDISO [name] 12/11/2022 11:00 A M Medical Record Number: [id number] Patient Account Number: [id number] Date of Birth/Sex: Treating RN: 1941-08-30 (82 y.o. F) Primary Care Paisleigh Maroney: Terrill Mohr Other Clinician: Referring Kersti Scavone: Treating Vernella Niznik/Extender: Renaldo Harrison in Treatment: 6 Visit Information History Since Last Visit All ordered tests and consults were completed: No Patient Arrived: [name] Added or deleted any medications: No Arrival Time: 11:11 Any new allergies or adverse reactions: No Accompanied By: neighbor Had a fall or experienced change in No Transfer Assistance: None activities of daily living that may affect Patient Identification Verified: Yes risk of falls: Secondary Verification Process Completed: Yes Signs or symptoms of abuse/neglect since last visito No Hospitalized since last visit: No Implantable device outside of the clinic excluding No cellular tissue based products placed in the center since last visit: Pain Present Now: No Electronic Signature(s) Signed: 12/11/2022 2:14:41 PM By: Worthy Rancher Entered By: Worthy Rancher on 12/11/2022 11:12:13 -------------------------------------------------------------------------------- Compression Therapy Details Patient Name: Date of Service: PA DDISO [name] 12/11/2022 11:00 A M Medical Record Number: [id number] Patient Account Number: [id number] Date of Birth/Sex: Treating RN: Sep 23, 1941 (82 y.o. [name] Primary Care Adilynne Fitzwater: Terrill Mohr Other Clinician: Referring Esthefany Herrig: Treating Makensie Mulhall/Extender: Serita Kyle Weeks in Treatment: 6 Compression Therapy Performed for Wound Assessment: Wound #1 Right,Anterior Lower Leg Performed By: Clinician Dellie Catholic,  RN Compression Type: Three Layer Post Procedure Diagnosis Same as Pre-procedure Electronic Signature(s) Signed: 12/11/2022 5:19:52 PM By: Dellie Catholic RN Entered By: Dellie Catholic on 12/11/2022 11:43:48 [name], [name] ([id number]) [id number] Page 2 of 7 -------------------------------------------------------------------------------- Encounter Discharge Information Details Patient Name: Date of Service: PA DDISO [name] 12/11/2022 11:00 A M Medical Record Number: [id number] Patient Account Number: [id number] Date of Birth/Sex: Treating RN: 04-07-1941 (82 y.o. [name] Primary Care Manjit Bufano: Terrill Mohr Other Clinician: Referring Guila Owensby: Treating Darryll Raju/Extender: Serita Kyle Weeks in Treatment: 6 Encounter Discharge Information Items Post Procedure Vitals Discharge Condition: Stable Temperature (F): 98.1 Ambulatory Status: Cane Pulse (bpm): 102 Discharge Destination: Home Respiratory Rate (breaths/min): 20 Transportation: Private Auto Blood Pressure (mmHg): 181/113 Accompanied By: friend Schedule Follow-up Appointment: Yes Clinical Summary of Care: Patient Declined Electronic Signature(s) Signed: 12/11/2022 5:19:52 PM By: Dellie Catholic RN Entered By: Dellie Catholic on 12/11/2022 17:15:20 -------------------------------------------------------------------------------- Lower Extremity Assessment Details Patient Name: Date of Service: PA DDISO [name] 12/11/2022 11:00 A M Medical Record Number: [id number] Patient Account Number: [id number] Date of Birth/Sex: Treating RN: Feb 04, 1941 (82 y.o. [name] Primary Care Dory Verdun: Terrill Mohr Other Clinician: Referring Jenafer Winterton: Treating Riann Oman/Extender: Serita Kyle Weeks in Treatment: 6 Edema Assessment Assessed: [Left: No] [Right: No] [Left: Edema] [Right: :] Calf Left: Right: Point of Measurement: 36 cm From Medial  Instep 38.8 cm Ankle Left: Right: Point of Measurement: 10 cm From Medial Instep 24.8 cm Vascular Assessment Pulses: Dorsalis Pedis Palpable: [Right:Yes] Electronic Signature(s) Signed: 12/11/2022 5:19:52 PM By: Dellie Catholic RN Entered By: Dellie Catholic on 12/11/2022 11:39:29 Multi Wound Chart Details -------------------------------------------------------------------------------- [name] ([id number]) [id number] Page 3 of 7 Patient Name: Date of Service: PA DDISO [name] 12/11/2022 11:00 A M Medical Record Number: [id number] Patient Account Number: [id number] Date of Birth/Sex: Treating RN: 05-29-1941 (82 y.o. F) Primary Care Jahbari Repinski: Terrill Mohr Other Clinician: Referring Elvin Banker: Treating Marge Vandermeulen/Extender: Serita Kyle Weeks in Treatment: 6 Vital Signs Height(in): 64 Pulse(bpm):  102 Weight(lbs): 180 Blood Pressure(mmHg): 181/113 Body Mass Index(BMI): 30.9 Temperature(F): 98.1 Respiratory Rate(breaths/min): 20 Wound Assessments Wound Number: 1 N/A N/A Photos: N/A N/A Right, Anterior Lower Leg N/A N/A Wound Location: Gradually Appeared N/A N/A Wounding Event: Venous Leg Ulcer N/A N/A Primary Etiology: Asthma, Hypertension, Osteoarthritis N/A N/A Comorbid History: 09/30/2022 N/A N/A Date Acquired: 6 N/A N/A Weeks of Treatment: Open N/A N/A Wound Status: No N/A N/A Wound Recurrence: Yes N/A N/A Clustered Wound: 7.3x5.2x0.1 N/A N/A Measurements L x W x D (cm) 29.814 N/A N/A A (cm) : rea 2.981 N/A N/A Volume (cm) : 23.30% N/A N/A % Reduction in A rea: 23.30% N/A N/A % Reduction in Volume: Full Thickness Without Exposed N/A N/A Classification: Support Structures Large N/A N/A Exudate A mount: Serosanguineous N/A N/A Exudate Type: red, brown N/A N/A Exudate Color: Distinct, outline attached N/A N/A Wound Margin: Medium (34-66%) N/A N/A Granulation A mount: Red, Hyper-granulation  N/A N/A Granulation Quality: Medium (34-66%) N/A N/A Necrotic A mount: Eschar, Adherent Slough N/A N/A Necrotic Tissue: Fat Layer (Subcutaneous Tissue): Yes N/A N/A Exposed Structures: Fascia: No Tendon: No Muscle: No Joint: No Bone: No Small (1-33%) N/A N/A Epithelialization: Debridement - Selective/Open Wound N/A N/A Debridement: Pre-procedure Verification/Time Out 11:35 N/A N/A Taken: Lidocaine 4% Topical Solution N/A N/A Pain Control: Necrotic/Eschar, Slough N/A N/A Tissue Debrided: Non-Viable Tissue N/A N/A Level: 37.96 N/A N/A Debridement A (sq cm): rea Curette N/A N/A Instrument: Minimum N/A N/A Bleeding: Pressure N/A N/A Hemostasis A chieved: 0 N/A N/A Procedural Pain: 0 N/A N/A Post Procedural Pain: Procedure was tolerated well N/A N/A Debridement Treatment Response: 7.3x5.2x0.1 N/A N/A Post Debridement Measurements L x W x D (cm) 2.981 N/A N/A Post Debridement Volume: (cm) Scarring: Yes N/A N/A Periwound Skin Texture: Excoriation: No Maceration: No N/A N/A Periwound Skin Moisture: Dry/Scaly: No Hemosiderin Staining: Yes N/A N/A Periwound Skin Color: No Abnormality N/A N/A Temperature: Clustered wound N/A N/A Assessment Notes: Compression Therapy N/A N/A Procedures Performed: Debridement Eilert, [name] ([id number]) [id number] Page 4 of 7 Treatment Notes Electronic Signature(s) Signed: 12/11/2022 11:57:55 AM By: Fredirick Maudlin MD FACS Entered By: Fredirick Maudlin on 12/11/2022 11:57:55 -------------------------------------------------------------------------------- Multi-Disciplinary Care Plan Details Patient Name: Date of Service: PA DDISO [name] 12/11/2022 11:00 A M Medical Record Number: [id number] Patient Account Number: [id number] Date of Birth/Sex: Treating RN: [birth year] (81 y.o. [name] Primary Care Minsa Weddington: Terrill Mohr Other Clinician: Referring Aviyana Sonntag: Treating  Tysha Grismore/Extender: Serita Kyle Weeks in Treatment: 6 Active Inactive Wound/Skin Impairment Nursing Diagnoses: Impaired tissue integrity Goals: Patient/caregiver will verbalize understanding of skin care regimen Date Initiated: 10/30/2022 Target Resolution Date: 01/22/2023 Goal Status: Active Interventions: Assess ulceration(s) every visit Treatment Activities: Skin care regimen initiated : 10/30/2022 Notes: Electronic Signature(s) Signed: 12/11/2022 5:19:52 PM By: Dellie Catholic RN Entered By: Dellie Catholic on 12/11/2022 17:15:33 -------------------------------------------------------------------------------- Pain Assessment Details Patient Name: Date of Service: PA DDISO [name] 12/11/2022 11:00 A M Medical Record Number: [id number] Patient Account Number: [id number] Date of Birth/Sex: Treating RN: 25-Oct-1941 (82 y.o. F) Primary Care Skila Rollins: Terrill Mohr Other Clinician: Referring Cariana Karge: Treating Leaman Abe/Extender: Serita Kyle Weeks in Treatment: 6 Active Problems Location of Pain Severity and Description of Pain Patient Has Paino No Site Locations [name], [name] ([id number]) [id number] Page 5 of 7 Pain Management and Medication Current Pain Management: Electronic Signature(s) Signed: 12/11/2022 2:14:41 PM By: Worthy Rancher Entered By: Worthy Rancher on 12/11/2022 11:12:44 -------------------------------------------------------------------------------- Patient/Caregiver Education Details Patient Name: Date of Service: PA DDISO [name]  1/18/2024andnbsp11:00 A M Medical Record Number: [id number] Patient Account Number: [id number] Date of Birth/Gender: Treating RN: 1941-09-22 (82 y.o. [name] Primary Care Physician: Brett Fairy Other Clinician: Referring Physician: Treating Physician/Extender: [name] in Treatment: 6 Education Assessment Education  Provided To: Patient Education Topics Provided Wound/Skin Impairment: Methods: Explain/Verbal Responses: Return demonstration correctly Electronic Signature(s) Signed: 12/11/2022 5:19:52 PM By: Karie Schwalbe RN Entered By: Karie Schwalbe on 12/11/2022 17:15:56 -------------------------------------------------------------------------------- Wound Assessment Details Patient Name: Date of Service: [name] 12/11/2022 11:00 A M Medical Record Number: [id number] Patient Account Number: [id number] Date of Birth/Sex: Treating RN: Feb 03, 1941 (82 y.o. [name] Primary Care Ajanee Buren: Brett Fairy Other Clinician: Referring Daralyn Bert: Treating Rondell Frick/Extender: [name] [name], [name] ([id number]) [id number] Page 6 of 7 Weeks in Treatment: 6 Wound Status Wound Number: 1 Primary Etiology: Venous Leg Ulcer Wound Location: Right, Anterior Lower Leg Wound Status: Open Wounding Event: Gradually Appeared Comorbid History: Asthma, Hypertension, Osteoarthritis Date Acquired: 09/30/2022 Weeks Of Treatment: 6 Clustered Wound: Yes Photos Wound Measurements Length: (cm) 7.3 Width: (cm) 5.2 Depth: (cm) 0.1 Area: (cm) 29.814 Volume: (cm) 2.981 % Reduction in Area: 23.3% % Reduction in Volume: 23.3% Epithelialization: Small (1-33%) Tunneling: No Undermining: No Wound Description Classification: Full Thickness Without Exposed Support Structures Wound Margin: Distinct, outline attached Exudate Amount: Large Exudate Type: Serosanguineous Exudate Color: red, brown Foul Odor After Cleansing: No Slough/Fibrino Yes Wound Bed Granulation Amount: Medium (34-66%) Exposed Structure Granulation Quality: Red, Hyper-granulation Fascia Exposed: No Necrotic Amount: Medium (34-66%) Fat Layer (Subcutaneous Tissue) Exposed: Yes Necrotic Quality: Eschar, Adherent Slough Tendon Exposed: No Muscle Exposed: No Joint Exposed:  No Bone Exposed: No Periwound Skin Texture Texture Color No Abnormalities Noted: No No Abnormalities Noted: No Excoriation: No Hemosiderin Staining: Yes Scarring: Yes Temperature / Pain Temperature: No Abnormality Moisture No Abnormalities Noted: No Dry / Scaly: No Maceration: No Assessment Notes Clustered wound Treatment Notes Wound #1 (Lower Leg) Wound Laterality: Right, Anterior Cleanser Soap and Water Discharge Instruction: May shower and wash wound with dial antibacterial soap and water prior to dressing change. Wound Cleanser Discharge Instruction: Cleanse the wound with wound cleanser prior to applying a clean dressing using gauze sponges, not tissue or cotton balls. Peri-Wound Care Triamcinolone 15 (g) Discharge Instruction: Use triamcinolone 15 (g) as directed Hineman, [name] ([id number]) [id number] Page 7 of 7 Sween Lotion (Moisturizing lotion) Discharge Instruction: Apply moisturizing lotion as directed Topical Mupirocin Ointment Discharge Instruction: Apply Mupirocin (Bactroban) as instructed Primary Dressing Sorbalgon AG Dressing, 4x4 (in/in) Discharge Instruction: Apply to wound bed as instructed Secondary Dressing Woven Gauze Sponge, Non-Sterile 4x4 in Discharge Instruction: Apply over primary dressing as directed. Zetuvit Plus 4x8 in Discharge Instruction: Apply over primary dressing as directed. Secured With Compression Wrap ThreePress (3 layer compression wrap) Discharge Instruction: Apply three layer compression as directed. Compression Stockings Add-Ons Electronic Signature(s) Signed: 12/11/2022 5:19:52 PM By: Karie Schwalbe RN Entered By: Karie Schwalbe on 12/11/2022 11:47:31 -------------------------------------------------------------------------------- Vitals Details Patient Name: Date of Service: [name] 12/11/2022 11:00 A M Medical Record Number: [id number] Patient Account Number: [id number] Date of  Birth/Sex: Treating RN: [birth year] (82 y.o. F) Primary Care Dea Bitting: Brett Fairy Other Clinician: Referring Kewan Mcnease: Treating Joao Mccurdy/Extender: [name] Weeks in Treatment: 6 Vital Signs Time Taken: 11:12 Temperature (F): 98.1 Height (in): 64 Pulse (bpm): 102 Weight (lbs): 180 Respiratory Rate (breaths/min): 20 Body Mass Index (BMI): 30.9 Blood Pressure (mmHg): 181/113 Reference Range: 80 - 120 mg / dl Electronic Signature(s) Signed:  12/11/2022 2:14:41 PM By: Dayton Scrape Entered By: Dayton Scrape on 12/11/2022 11:12:38

## 2022-12-11 NOTE — Progress Notes (Signed)
[name] ([id number]) [id number] Page 1 of 9 Visit Report for 12/11/2022 Chief Complaint Document Details Patient Name: Date of Service: [name] [facility] 12/11/2022 11:00 A M Medical Record Number: [id number] Patient Account Number: [id number] Date of Birth/Sex: Treating RN: 11-Jan-1941 (82 y.o. F) Primary Care Provider: Brett Fairy Other Clinician: Referring Provider: Treating Provider/Extender: [name] in Treatment: 6 Information Obtained from: Patient Chief Complaint Patient presents for treatment of open ulcers due to venous insufficiency Electronic Signature(s) Signed: 12/11/2022 11:58:01 AM By: Duanne Guess MD FACS Entered By: Duanne Guess on 12/11/2022 11:58:01 -------------------------------------------------------------------------------- Debridement Details Patient Name: Date of Service: [name] 12/11/2022 11:00 A M Medical Record Number: [id number] Patient Account Number: [id number] Date of Birth/Sex: Treating RN: [birth year] (82 y.o. [name] Primary Care Provider: Brett Fairy Other Clinician: Referring Provider: Treating Provider/Extender: [name] Weeks in Treatment: 6 Debridement Performed for Assessment: Wound #1 Right,Anterior Lower Leg Performed By: Physician Duanne Guess, MD Debridement Type: Debridement Severity of Tissue Pre Debridement: Fat layer exposed Level of Consciousness (Pre-procedure): Awake and Alert Pre-procedure Verification/Time Out Yes - 11:35 Taken: Start Time: 11:35 Pain Control: Lidocaine 4% T opical Solution T Area Debrided (L x W): otal 7.3 (cm) x 5.2 (cm) = 37.96 (cm) Tissue and other material debrided: Non-Viable, Eschar, Slough, Slough Level: Non-Viable Tissue Debridement Description: Selective/Open Wound Instrument: Curette Bleeding: Minimum Hemostasis Achieved: Pressure End Time: 11:37 Procedural  Pain: 0 Post Procedural Pain: 0 Response to Treatment: Procedure was tolerated well Level of Consciousness (Post- Awake and Alert procedure): Post Debridement Measurements of Total Wound Length: (cm) 7.3 Width: (cm) 5.2 Depth: (cm) 0.1 Volume: (cm) 2.981 Character of Wound/Ulcer Post Debridement: Improved Severity of Tissue Post Debridement: Fat layer exposed Kuehnle, Rilyn ([id number]) [id number] Page 2 of 9 Post Procedure Diagnosis Same as Pre-procedure Notes Scribed for Dr. Lady Gary by J.Scotton Electronic Signature(s) Signed: 12/11/2022 12:01:41 PM By: Duanne Guess MD FACS Signed: 12/11/2022 5:19:52 PM By: Karie Schwalbe RN Entered By: Karie Schwalbe on 12/11/2022 11:43:34 -------------------------------------------------------------------------------- HPI Details Patient Name: Date of Service: [name] 12/11/2022 11:00 A M Medical Record Number: [id number] Patient Account Number: [id number] Date of Birth/Sex: Treating RN: 1941/01/17 (82 y.o. 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 The patient and her family elected for comfort care however somehow she managed to heal her wounds. From the electronic medical record, it looks like she was receiving home health services and being followed by her primary care provider. At the beginning of November, she saw her PCP again and had new ulcers on her right leg. Home health services were not available to assist the patient and she was referred to the wound  care center for further evaluation and management. On the right anterior lower leg, she has multiple ulcers of varying degrees and depths. Some are limited to breakdown of skin and others are deeper, into the fat layer. There is slough and some eschar accumulation. She has periwound erythema and 1-2+ edema. 12/14-second visit for this woman who arrived to clinic accompanied by a neighbor. 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  She tells me that in 2021 she had bilateral lower leg wounds which took an ordinarily long period of time to healo 2 years. The current wounds have been on the right leg for about 2 months. She may or may not have been wearing a support stocking on the right leg. She has not been using anything on the left but fortunately does not have any wounds there. She makes it clear to me that she cannot get standard over the toe stockings on herself. Her husband passed away sometime earlier this year I believe 11/13/2022: All of the wounds seem to have progressed, getting larger and at this point and the fat layer is exposed in all locations. There is slough accumulation on all surfaces. 11/20/2022: Her wounds continue to worsen. They have expanded and there is thick slough on all of the wound surfaces. She is experiencing more pain. 11/28/2022: The cultures that I took last week grew out methicillin-resistant Staph aureus so we discontinued Augmentin and initiated doxycycline. She is currently taking this. Her wounds are less painful today. They are all about the same size, but they have not expanded. There is rubbery slough on the wound surfaces. 12/04/2022: The wounds look improved today after being on doxycycline and using topical mupirocin. There is still a fair amount of slough accumulation. 12/11/2022: The wounds are smaller again this week. They continue to accumulate some slough and remain tender. She has completed the oral doxycycline. Electronic  Signature(s) Signed: 12/11/2022 11:59:17 AM By: Duanne Guess MD FACS Entered By: Duanne Guess on 12/11/2022 11:59:17 -------------------------------------------------------------------------------- Physical Exam Details Patient Name: Date of Service: [name] 12/11/2022 11:00 A M Medical Record Number: [id number] Patient Account Number: [id number] [name], [name] ([id number]) [id number] Page 3 of 9 Date of Birth/Sex: Treating RN: [birth year] (82 y.o. F) Primary Care Provider: Other Clinician: Brett Fairy Referring Provider: Treating Provider/Extender: [name] Weeks in Treatment: 6 Constitutional Hypertensive, asymptomatic. Slightly tachycardic. . . no acute distress. Respiratory Normal work of breathing on room air. Notes 12/11/2022: The wounds are smaller again this week. They continue to accumulate some slough and remain tender. Electronic Signature(s) Signed: 12/11/2022 11:59:57 AM By: Duanne Guess MD FACS Entered By: Duanne Guess on 12/11/2022 11:59:56 -------------------------------------------------------------------------------- Physician Orders Details Patient Name: Date of Service: [name] 12/11/2022 11:00 A M Medical Record Number: [id number] Patient Account Number: [id number] Date of Birth/Sex: Treating RN: 12/28/1940 (82 y.o. [name] Primary Care Provider: Brett Fairy Other Clinician: Referring Provider: Treating Provider/Extender: [name] Weeks in Treatment: 6 Verbal / Phone Orders: No Diagnosis Coding ICD-10 Coding Code Description [phone] Non-pressure chronic ulcer of other part of right lower leg with fat layer exposed I87.2 Venous insufficiency (chronic) (peripheral) J44.89 Other specified chronic obstructive pulmonary disease I73.9 Peripheral vascular disease, unspecified Follow-up Appointments ppointment in 1 week. - Dr.  Lady Gary - Room 3 Return A Anesthetic (In clinic) Topical Lidocaine 5% applied to wound bed Bathing/ Shower/ Hygiene Other Bathing/Shower/Hygiene Orders/Instructions: - May take a sponge bath Edema Control - Lymphedema / SCD / Other Elevate legs to the level of the heart or above for 30 minutes daily and/or when sitting for 3-4 times a day throughout the day. Avoid standing for long periods of time. Exercise regularly Moisturize legs daily. - Use lotion on left leg-STOP using the steroid cream Wound Treatment Wound #1 - Lower Leg Wound Laterality: Right, Anterior Cleanser: Soap and Water 1 x Per Week/30 Days Discharge Instructions: May shower and  wash wound with dial antibacterial soap and water prior to dressing change. Cleanser: Wound Cleanser 1 x Per Week/30 Days Discharge Instructions: Cleanse the wound with wound cleanser prior to applying a clean dressing using gauze sponges, not tissue or cotton balls. Peri-Wound Care: Triamcinolone 15 (g) 1 x Per Week/30 Days Discharge Instructions: Use triamcinolone 15 (g) as directed Peri-Wound Care: Sween Lotion (Moisturizing lotion) 1 x Per Week/30 Days Discharge Instructions: Apply moisturizing lotion as directed Virag, [name] ([id number]) [id number] Page 4 of 9 Topical: Mupirocin Ointment 1 x Per Week/30 Days Discharge Instructions: Apply Mupirocin (Bactroban) as instructed Prim Dressing: Sorbalgon AG Dressing, 4x4 (in/in) 1 x Per Week/30 Days ary Discharge Instructions: Apply to wound bed as instructed Secondary Dressing: Woven Gauze Sponge, Non-Sterile 4x4 in 1 x Per Week/30 Days Discharge Instructions: Apply over primary dressing as directed. Secondary Dressing: Zetuvit Plus 4x8 in 1 x Per Week/30 Days Discharge Instructions: Apply over primary dressing as directed. Compression Wrap: ThreePress (3 layer compression wrap) 1 x Per Week/30 Days Discharge Instructions: Apply three layer compression as  directed. Electronic Signature(s) Signed: 12/11/2022 12:01:41 PM By: Duanne Guess MD FACS Entered By: Duanne Guess on 12/11/2022 12:00:09 -------------------------------------------------------------------------------- Problem List Details Patient Name: Date of Service: [name] 12/11/2022 11:00 A M Medical Record Number: [id number] Patient Account Number: [id number] Date of Birth/Sex: Treating RN: June 17, 1941 (82 y.o. F) Primary Care Provider: Brett Fairy Other Clinician: Referring Provider: Treating Provider/Extender: [name] Weeks in Treatment: 6 Active Problems ICD-10 Encounter Code Description Active Date MDM Diagnosis [phone] Non-pressure chronic ulcer of other part of right lower leg with fat layer 10/30/2022 No Yes exposed I87.2 Venous insufficiency (chronic) (peripheral) 10/30/2022 No Yes J44.89 Other specified chronic obstructive pulmonary disease 10/30/2022 No Yes I73.9 Peripheral vascular disease, unspecified 10/30/2022 No Yes Inactive Problems Resolved Problems Electronic Signature(s) Signed: 12/11/2022 11:57:49 AM By: Duanne Guess MD FACS Entered By: Duanne Guess on 12/11/2022 11:57:49 Ashline, [name] ([id number]) [id number] Page 5 of 9 -------------------------------------------------------------------------------- Progress Note Details Patient Name: Date of Service: [name] 12/11/2022 11:00 A M Medical Record Number: [id number] Patient Account Number: [id number] Date of Birth/Sex: Treating RN: 1941/02/11 (82 y.o. 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 The patient and her family elected for comfort care however somehow she managed to heal her wounds. From the electronic medical record, it looks like she was receiving home health services and being followed by her primary care provider. At the beginning of November, she saw her PCP again and had new ulcers on her right leg. Home health services were not available to assist the patient and she was referred to the wound care center for further evaluation and management. On the right anterior lower leg, she has multiple ulcers of varying degrees and depths. Some are limited to breakdown of skin and others are deeper, into the fat layer. There is slough and some eschar accumulation. She has periwound erythema and 1-2+ edema. 12/14-second visit for this woman who arrived to clinic accompanied by a neighbor. She has venous  related ulcerations of her right anterior lower leg. She tells me that in 2021 she had bilateral lower leg wounds which took an ordinarily long period of time to healo 2 years. The current wounds have been on the right leg for about 2 months. She may or may not have been wearing a support stocking on the right leg. She has not been using anything on the left but fortunately does not have any wounds there. She makes it clear to me that she cannot get standard over the toe stockings on herself. Her husband passed away sometime earlier this year I believe 11/13/2022: All of the wounds seem to have progressed, getting larger and at this point and the fat layer is exposed in all locations. There is slough accumulation on all surfaces. 11/20/2022: Her wounds continue to worsen. They have expanded and there is thick slough on all of the wound surfaces.  She is experiencing more pain. 11/28/2022: The cultures that I took last week grew out methicillin-resistant Staph aureus so we discontinued Augmentin and initiated doxycycline. She is currently taking this. Her wounds are less painful today. They are all about the same size, but they have not expanded. There is rubbery slough on the wound surfaces. 12/04/2022: The wounds look improved today after being on doxycycline and using topical mupirocin. There is still a fair amount of slough accumulation. 12/11/2022: The wounds are smaller again this week. They continue to accumulate some slough and remain tender. She has completed the oral doxycycline. Patient History Information obtained from Patient. Social History Never smoker, Marital Status - Widowed, Alcohol Use - Never, Drug Use - No History, Caffeine Use - Daily. Medical History Respiratory Patient has history of Asthma Cardiovascular Patient has history of Hypertension Musculoskeletal Patient has history of Osteoarthritis Hospitalization/Surgery History - Bilateral Cataract Extraction; Bone Graft-jaw; Arm Surgery (d/t dog bite). Medical A Surgical History Notes nd Eyes Hx: Cataracts Ear/Nose/Mouth/Throat Hx: wearing Bilateral hearing aids Genitourinary Chronic Renal Impairment Stage 3 Psychiatric Hx: Depression Mahaney, [name] ([id number]) [id number] Page 6 of 9 Objective Constitutional Hypertensive, asymptomatic. Slightly tachycardic. no acute distress. Vitals Time Taken: 11:12 AM, Height: 64 in, Weight: 180 lbs, BMI: 30.9, Temperature: 98.1 F, Pulse: 102 bpm, Respiratory Rate: 20 breaths/min, Blood Pressure: 181/113 mmHg. Respiratory Normal work of breathing on room air. General Notes: 12/11/2022: The wounds are smaller again this week. They continue to accumulate some slough and remain tender. Integumentary (Hair, Skin) Wound #1 status is Open. Original cause of wound was Gradually Appeared. The  date acquired was: 09/30/2022. The wound has been in treatment 6 weeks. The wound is located on the Right,Anterior Lower Leg. The wound measures 7.3cm length x 5.2cm width x 0.1cm depth; 29.814cm^2 area and 2.981cm^3 volume. There is Fat Layer (Subcutaneous Tissue) exposed. There is no tunneling or undermining noted. There is a large amount of serosanguineous drainage noted. The wound margin is distinct with the outline attached to the wound base. There is medium (34-66%) red, hyper - granulation within the wound bed. There is a medium (34-66%) amount of necrotic tissue within the wound bed including Eschar and Adherent Slough. The periwound skin appearance exhibited: Scarring, Hemosiderin Staining. The periwound skin appearance did not exhibit: Excoriation, Dry/Scaly, Maceration. Periwound temperature was noted as No Abnormality. General Notes: Clustered wound Assessment Active Problems ICD-10 Non-pressure chronic ulcer of other part of right lower leg with fat layer exposed Venous insufficiency (chronic) (peripheral) Other specified chronic obstructive pulmonary disease Peripheral vascular disease, unspecified Procedures Wound #1 Pre-procedure diagnosis of  Wound #1 is a Venous Leg Ulcer located on the Right,Anterior Lower Leg .Severity of Tissue Pre Debridement is: Fat layer exposed. There was a Selective/Open Wound Non-Viable Tissue Debridement with a total area of 37.96 sq cm performed by Duanne Guessannon, Yousra Ivens, MD. With the following instrument(s): Curette to remove Non-Viable tissue/material. Material removed includes Eschar and Slough and after achieving pain control using Lidocaine 4% T opical Solution. No specimens were taken. A time out was conducted at 11:35, prior to the start of the procedure. A Minimum amount of bleeding was controlled with Pressure. The procedure was tolerated well with a pain level of 0 throughout and a pain level of 0 following the procedure. Post  Debridement Measurements: 7.3cm length x 5.2cm width x 0.1cm depth; 2.981cm^3 volume. Character of Wound/Ulcer Post Debridement is improved. Severity of Tissue Post Debridement is: Fat layer exposed. Post procedure Diagnosis Wound #1: Same as Pre-Procedure General Notes: Scribed for Dr. Lady Garyannon by J.Scotton. Pre-procedure diagnosis of Wound #1 is a Venous Leg Ulcer located on the Right,Anterior Lower Leg . There was a Three Layer Compression Therapy Procedure by Karie SchwalbeScotton, Joanne, RN. Post procedure Diagnosis Wound #1: Same as Pre-Procedure Plan Follow-up Appointments: Return Appointment in 1 week. - Dr. Lady Garyannon - Room 3 Anesthetic: (In clinic) Topical Lidocaine 5% applied to wound bed Bathing/ Shower/ Hygiene: Other Bathing/Shower/Hygiene Orders/Instructions: - May take a sponge bath Edema Control - Lymphedema / SCD / Other: Elevate legs to the level of the heart or above for 30 minutes daily and/or when sitting for 3-4 times a day throughout the day. Avoid standing for long periods of time. Exercise regularly Moisturize legs daily. - Use lotion on left leg-STOP using the steroid cream WOUND #1: - Lower Leg Wound Laterality: Right, Anterior Cleanser: Soap and Water 1 x Per Week/30 Days Discharge Instructions: May shower and wash wound with dial antibacterial soap and water prior to dressing change. Cleanser: Wound Cleanser 1 x Per Week/30 Days Discharge Instructions: Cleanse the wound with wound cleanser prior to applying a clean dressing using gauze sponges, not tissue or cotton balls. Peri-Wound Care: Triamcinolone 15 (g) 1 x Per Week/30 Days Discharge Instructions: Use triamcinolone 15 (g) as directed Bissonnette, [name] ([id number]) [id number] Page 7 of 9 Peri-Wound Care: Sween Lotion (Moisturizing lotion) 1 x Per Week/30 Days Discharge Instructions: Apply moisturizing lotion as directed Topical: Mupirocin Ointment 1 x Per Week/30 Days Discharge Instructions:  Apply Mupirocin (Bactroban) as instructed Prim Dressing: Sorbalgon AG Dressing, 4x4 (in/in) 1 x Per Week/30 Days ary Discharge Instructions: Apply to wound bed as instructed Secondary Dressing: Woven Gauze Sponge, Non-Sterile 4x4 in 1 x Per Week/30 Days Discharge Instructions: Apply over primary dressing as directed. Secondary Dressing: Zetuvit Plus 4x8 in 1 x Per Week/30 Days Discharge Instructions: Apply over primary dressing as directed. Com pression Wrap: ThreePress (3 layer compression wrap) 1 x Per Week/30 Days Discharge Instructions: Apply three layer compression as directed. 12/11/2022: The wounds are smaller again this week. They continue to accumulate some slough and remain tender. I used a curette to debride slough and eschar from the wounds. We will continue topical mupirocin with silver alginate and 3 layer compression. Follow-up in 1 week. Electronic Signature(s) Signed: 12/11/2022 12:00:53 PM By: Duanne Guessannon, Bryar Rennie MD FACS Entered By: Duanne Guessannon, Emmabelle Fear on 12/11/2022 12:00:53 -------------------------------------------------------------------------------- HxROS Details Patient Name: Date of Service: [name] RuddyN, [name] 12/11/2022 11:00 A M Medical Record Number: [id number] Patient Account Number: [id number] Date of Birth/Sex: Treating RN: Apr 20, 1941 (82 y.o. F) Primary Care Provider: Brett FairyEksir, Samantha  Other Clinician: Referring Provider: Treating Provider/Extender: Renaldo Harrison in Treatment: 6 Information Obtained From Patient Eyes Medical History: Past Medical History Notes: Hx: Cataracts Ear/Nose/Mouth/Throat Medical History: Past Medical History Notes: Hx: wearing Bilateral hearing aids Respiratory Medical History: Positive for: Asthma Cardiovascular Medical History: Positive for: Hypertension Genitourinary Medical History: Past Medical History Notes: Chronic Renal Impairment Stage 3 Musculoskeletal Medical History: Positive for:  Osteoarthritis Sotomayor, [name] ([id number]) [id number] Page 8 of 9 Psychiatric Medical History: Past Medical History Notes: Hx: Depression Immunizations Pneumococcal Vaccine: Received Pneumococcal Vaccination: Yes Received Pneumococcal Vaccination On or After 60th Birthday: Yes Implantable Devices None Hospitalization / Surgery History Type of Hospitalization/Surgery Bilateral Cataract Extraction; Bone Graft-jaw; Arm Surgery (d/t dog bite) Family and Social History Never smoker; Marital Status - Widowed; Alcohol Use: Never; Drug Use: No History; Caffeine Use: Daily; Financial Concerns: No; Food, Clothing or Shelter Needs: No; Support System Lacking: No; Transportation Concerns: No Electronic Signature(s) Signed: 12/11/2022 12:01:41 PM By: Fredirick Maudlin MD FACS Entered By: Fredirick Maudlin on 12/11/2022 11:59:23 -------------------------------------------------------------------------------- SuperBill Details Patient Name: Date of Service: [name] 12/11/2022 Medical Record Number: [id number] Patient Account Number: [id number] Date of Birth/Sex: Treating RN: [birth year] (82 y.o. F) Primary Care Provider: Terrill Mohr Other Clinician: Referring Provider: Treating Provider/Extender: Serita Kyle Weeks in Treatment: 6 Diagnosis Coding ICD-10 Codes Code Description [phone] Non-pressure chronic ulcer of other part of right lower leg with fat layer exposed I87.2 Venous insufficiency (chronic) (peripheral) J44.89 Other specified chronic obstructive pulmonary disease I73.9 Peripheral vascular disease, unspecified Facility Procedures : CPT4 Code: [id number] Description: 37106 - DEBRIDE WOUND 1ST 20 SQ CM OR < ICD-10 Diagnosis Description L97.812 Non-pressure chronic ulcer of other part of right lower leg with fat layer expos Modifier: ed Quantity: 1 : CPT4 Code: [id number] Description: 27035 - DEBRIDE WOUND EA ADDL 20 SQ  CM ICD-10 Diagnosis Description L97.812 Non-pressure chronic ulcer of other part of right lower leg with fat layer expos Modifier: ed Quantity: 1 Physician Procedures : CPT4 Code Description Modifier [id number] - WC PHYS LEVEL 4 - EST PT 25 ICD-10 Diagnosis Description L97.812 Non-pressure chronic ulcer of other part of right lower leg with fat layer exposed I87.2 Venous insufficiency (chronic) (peripheral) J44.89  Other specified chronic obstructive pulmonary disease Grogan, [name] ([id number]) [id number] Peripheral vascular disease, unspecified Quantity: 1 27.pdf Page 9 of 9 : [id number] 97597 - WC PHYS DEBR WO ANESTH 20 SQ CM 1 ICD-10 Diagnosis Description C58.527 Non-pressure chronic ulcer of other part of right lower leg with fat layer exposed Quantity: : [id number] 36144 - WC PHYS DEBR WO ANESTH EA ADD 20 CM 1 ICD-10 Diagnosis Description R15.400 Non-pressure chronic ulcer of other part of right lower leg with fat layer exposed Quantity: Electronic Signature(s) Signed: 12/11/2022 12:01:23 PM By: Fredirick Maudlin MD FACS Entered By: Fredirick Maudlin on 12/11/2022 12:01:23

## 2022-12-18 ENCOUNTER — Encounter (HOSPITAL_BASED_OUTPATIENT_CLINIC_OR_DEPARTMENT_OTHER): Payer: Medicare Other | Admitting: General Surgery

## 2022-12-18 DIAGNOSIS — L97812 Non-pressure chronic ulcer of other part of right lower leg with fat layer exposed: Secondary | ICD-10-CM | POA: Diagnosis not present

## 2022-12-18 NOTE — Progress Notes (Signed)
[name] ([id number]) [id number] Page 1 of 7 Visit Report for 12/18/2022 Arrival Information Details Patient Name: Date of Service: PA DDISO [name] [facility] 12/18/2022 11:00 A M Medical Record Number: [id number] Patient Account Number: [id number] Date of Birth/Sex: Treating RN: Apr 10, 1941 (82 y.o. [name] Primary Care Makeisha Jentsch: Terrill Mohr Other Clinician: Referring Annahi Short: Treating Sheretha Shadd/Extender: Renaldo Harrison in Treatment: 7 Visit Information History Since Last Visit Added or deleted any medications: No Patient Arrived: [name] Any new allergies or adverse reactions: No Arrival Time: 11:06 Had a fall or experienced change in No Accompanied By: friend (neighbor) activities of daily living that may affect Transfer Assistance: Manual risk of falls: Patient Identification Verified: Yes Signs or symptoms of abuse/neglect since last visito No Hospitalized since last visit: No Implantable device outside of the clinic excluding No cellular tissue based products placed in the center since last visit: Has Dressing in Place as Prescribed: Yes Has Compression in Place as Prescribed: Yes Pain Present Now: Yes Electronic Signature(s) Signed: 12/18/2022 12:11:12 PM By: Dellie Catholic RN Entered By: Dellie Catholic on 12/18/2022 11:07:16 -------------------------------------------------------------------------------- Compression Therapy Details Patient Name: Date of Service: PA DDISO [name] 12/18/2022 11:00 A M Medical Record Number: [id number] Patient Account Number: [id number] Date of Birth/Sex: Treating RN: 1941/10/20 (82 y.o. [name] Primary Care Jameeka Marcy: Terrill Mohr Other Clinician: Referring Holland Nickson: Treating Tynia Wiers/Extender: Serita Kyle Weeks in Treatment: 7 Compression Therapy Performed for Wound Assessment: Wound #1 Right,Anterior Lower Leg Performed By: Clinician  Dellie Catholic, RN Compression Type: Three Layer Post Procedure Diagnosis Same as Pre-procedure Electronic Signature(s) Signed: 12/18/2022 12:11:12 PM By: Dellie Catholic RN Entered By: Dellie Catholic on 12/18/2022 11:43:58 Relph, [name] ([id number]) [id number] Page 2 of 7 -------------------------------------------------------------------------------- Encounter Discharge Information Details Patient Name: Date of Service: PA DDISO [name] 12/18/2022 11:00 A M Medical Record Number: [id number] Patient Account Number: [id number] Date of Birth/Sex: Treating RN: 15-Jul-1941 (82 y.o. [name] Primary Care Branko Steeves: Terrill Mohr Other Clinician: Referring Nayellie Sanseverino: Treating Jaye Polidori/Extender: Serita Kyle Weeks in Treatment: 7 Encounter Discharge Information Items Post Procedure Vitals Discharge Condition: Stable Temperature (F): 98.1 Ambulatory Status: Cane Pulse (bpm): 89 Discharge Destination: Home Respiratory Rate (breaths/min): 18 Transportation: Private Auto Blood Pressure (mmHg): 125/86 Accompanied By: Denman George Schedule Follow-up Appointment: Yes Clinical Summary of Care: Patient Declined Electronic Signature(s) Signed: 12/18/2022 12:11:12 PM By: Dellie Catholic RN Entered By: Dellie Catholic on 12/18/2022 12:09:19 -------------------------------------------------------------------------------- Lower Extremity Assessment Details Patient Name: Date of Service: PA DDISO [name] 12/18/2022 11:00 A M Medical Record Number: [id number] Patient Account Number: [id number] Date of Birth/Sex: Treating RN: Jul 14, 1941 (82 y.o. [name] Primary Care Josealberto Montalto: Terrill Mohr Other Clinician: Referring Valrie Jia: Treating Dannel Rafter/Extender: Serita Kyle Weeks in Treatment: 7 Edema Assessment Assessed: [Left: No] [Right: No] [Left: Edema] [Right: :] Calf Left: Right: Point of Measurement: 36  cm From Medial Instep 44 cm 38.8 cm Ankle Left: Right: Point of Measurement: 10 cm From Medial Instep 26 cm 24.8 cm Knee To Floor Left: Right: From Medial Instep 42 cm 42 cm Vascular Assessment Pulses: Dorsalis Pedis Palpable: [Left:Yes] [Right:Yes] Electronic Signature(s) Signed: 12/18/2022 12:12:26 PM By: Dellie Catholic RN Previous Signature: 12/18/2022 12:11:12 PM Version By: Dellie Catholic RN Entered By: Dellie Catholic on 12/18/2022 12:12:06 Berwick, [name] ([id number]) [id number] Page 3 of 7 -------------------------------------------------------------------------------- Multi Wound Chart Details Patient Name: Date of Service: PA DDISO [name] 12/18/2022 11:00 A M Medical Record Number: [id number] Patient Account Number: [id number] Date  of Birth/Sex: Treating RN: 1941-06-12 (82 y.o. F) Primary Care Donaven Criswell: Terrill Mohr Other Clinician: Referring Kymberley Raz: Treating Gissela Bloch/Extender: Serita Kyle Weeks in Treatment: 7 Vital Signs Height(in): 64 Pulse(bpm): 89 Weight(lbs): 180 Blood Pressure(mmHg): 125/86 Body Mass Index(BMI): 30.9 Temperature(F): 98.1 Respiratory Rate(breaths/min): 18 [1:Photos:] [N/A:N/A] Right, Anterior Lower Leg N/A N/A Wound Location: Gradually Appeared N/A N/A Wounding Event: Venous Leg Ulcer N/A N/A Primary Etiology: Asthma, Hypertension, Osteoarthritis N/A N/A Comorbid History: 09/30/2022 N/A N/A Date Acquired: 7 N/A N/A Weeks of Treatment: Open N/A N/A Wound Status: No N/A N/A Wound Recurrence: Yes N/A N/A Clustered Wound: 7.1x4.8x0.1 N/A N/A Measurements L x W x D (cm) 26.766 N/A N/A A (cm) : rea 2.677 N/A N/A Volume (cm) : 31.20% N/A N/A % Reduction in A rea: 31.10% N/A N/A % Reduction in Volume: Full Thickness Without Exposed N/A N/A Classification: Support Structures Large N/A N/A Exudate A mount: Serosanguineous N/A N/A Exudate Type: red, brown N/A  N/A Exudate Color: Distinct, outline attached N/A N/A Wound Margin: Medium (34-66%) N/A N/A Granulation A mount: Red, Hyper-granulation N/A N/A Granulation Quality: Medium (34-66%) N/A N/A Necrotic A mount: Eschar, Adherent Slough N/A N/A Necrotic Tissue: Fat Layer (Subcutaneous Tissue): Yes N/A N/A Exposed Structures: Fascia: No Tendon: No Muscle: No Joint: No Bone: No Small (1-33%) N/A N/A Epithelialization: Debridement - Selective/Open Wound N/A N/A Debridement: Pre-procedure Verification/Time Out 11:40 N/A N/A Taken: Lidocaine 4% Topical Solution N/A N/A Pain Control: Necrotic/Eschar, Slough N/A N/A Tissue Debrided: Non-Viable Tissue N/A N/A Level: 26.98 N/A N/A Debridement A (sq cm): rea Curette N/A N/A Instrument: Minimum N/A N/A Bleeding: Pressure N/A N/A Hemostasis A chieved: 0 N/A N/A Procedural Pain: 0 N/A N/A Post Procedural Pain: Procedure was tolerated well N/A N/A Debridement Treatment Response: 7.1x3.8x0.1 N/A N/A Post Debridement Measurements L x W x D (cm) 2.119 N/A N/A Post Debridement Volume: (cm) Scarring: Yes N/A N/A Periwound Skin Texture: Excoriation: No Maceration: No N/A N/A Periwound Skin Moisture: [place], [name] ([id number] Page 4 of 7 Dry/Scaly: No Hemosiderin Staining: Yes N/A N/A Periwound Skin Color: No Abnormality N/A N/A Temperature: Compression Therapy N/A N/A Procedures Performed: Debridement Treatment Notes Electronic Signature(s) Signed: 12/18/2022 11:50:02 AM By: Fredirick Maudlin MD FACS Entered By: Fredirick Maudlin on 12/18/2022 11:50:02 -------------------------------------------------------------------------------- Multi-Disciplinary Care Plan Details Patient Name: Date of Service: PA DDISO [name] 12/18/2022 11:00 A M Medical Record Number: [id number] Patient Account Number: [id number] Date of Birth/Sex: Treating RN: [birth year] (82 y.o. [name] Primary Care Atoya Andrew: Terrill Mohr Other Clinician: Referring Elanie Hammitt: Treating Nole Robey/Extender: Serita Kyle Weeks in Treatment: 7 Active Inactive Wound/Skin Impairment Nursing Diagnoses: Impaired tissue integrity Goals: Patient/caregiver will verbalize understanding of skin care regimen Date Initiated: 10/30/2022 Target Resolution Date: 01/22/2023 Goal Status: Active Interventions: Assess ulceration(s) every visit Treatment Activities: Skin care regimen initiated : 10/30/2022 Notes: Electronic Signature(s) Signed: 12/18/2022 12:11:12 PM By: Dellie Catholic RN Entered By: Dellie Catholic on 12/18/2022 11:39:36 -------------------------------------------------------------------------------- Pain Assessment Details Patient Name: Date of Service: PA DDISO [name] 12/18/2022 11:00 A M Medical Record Number: [id number] Patient Account Number: [id number] Date of Birth/Sex: Treating RN: Mar 24, 1941 (82 y.o. [name] Primary Care Ghali Morissette: Terrill Mohr Other Clinician: Referring Takya Vandivier: Treating Iver Miklas/Extender: Serita Kyle Weeks in Treatment: 7 Active Problems Location of Pain Severity and Description of Pain Lopezgarcia, Mylissa ([id number]) [phone].pdf Page 5 of 7 Patient Has Paino No Site Locations Pain Management and Medication Current Pain Management: Electronic Signature(s) Signed: 12/18/2022 12:11:12 PM By: Dellie Catholic RN  Entered By: Dellie Catholic on 12/18/2022 11:24:48 -------------------------------------------------------------------------------- Patient/Caregiver Education Details Patient Name: Date of Service: PA DDISO [name] 1/25/2024andnbsp11:00 A M Medical Record Number: [id number] Patient Account Number: [id number] Date of Birth/Gender: Treating RN: 1941-07-05 (82 y.o. [name] Primary Care Physician: Terrill Mohr Other Clinician: Referring  Physician: Treating Physician/Extender: Renaldo Harrison in Treatment: 7 Education Assessment Education Provided To: Patient Education Topics Provided Wound/Skin Impairment: Methods: Explain/Verbal Responses: Return demonstration correctly Electronic Signature(s) Signed: 12/18/2022 12:11:12 PM By: Dellie Catholic RN Entered By: Dellie Catholic on 12/18/2022 11:41:01 -------------------------------------------------------------------------------- Wound Assessment Details Patient Name: Date of Service: PA DDISO [name] 12/18/2022 11:00 A M Medical Record Number: [id number] Patient Account Number: [id number] FATINA, SPRANKLE ([id number]) [phone].pdf Page 6 of 7 Date of Birth/Sex: Treating RN: [birth year] (82 y.o. [name] Primary Care Etienne Mowers: Other Clinician: Terrill Mohr Referring Dejah Droessler: Treating Bexley Mclester/Extender: Serita Kyle Weeks in Treatment: 7 Wound Status Wound Number: 1 Primary Etiology: Venous Leg Ulcer Wound Location: Right, Anterior Lower Leg Wound Status: Open Wounding Event: Gradually Appeared Comorbid History: Asthma, Hypertension, Osteoarthritis Date Acquired: 09/30/2022 Weeks Of Treatment: 7 Clustered Wound: Yes Photos Wound Measurements Length: (cm) 7.1 Width: (cm) 4.8 Depth: (cm) 0.1 Area: (cm) 26.766 Volume: (cm) 2.677 % Reduction in Area: 31.2% % Reduction in Volume: 31.1% Epithelialization: Small (1-33%) Tunneling: No Undermining: No Wound Description Classification: Full Thickness Without Exposed Support Structures Wound Margin: Distinct, outline attached Exudate Amount: Large Exudate Type: Serosanguineous Exudate Color: red, brown Foul Odor After Cleansing: No Slough/Fibrino Yes Wound Bed Granulation Amount: Medium (34-66%) Exposed Structure Granulation Quality: Red, Hyper-granulation Fascia Exposed: No Necrotic Amount: Medium (34-66%) Fat Layer  (Subcutaneous Tissue) Exposed: Yes Necrotic Quality: Eschar, Adherent Slough Tendon Exposed: No Muscle Exposed: No Joint Exposed: No Bone Exposed: No Periwound Skin Texture Texture Color No Abnormalities Noted: No No Abnormalities Noted: No Excoriation: No Hemosiderin Staining: Yes Scarring: Yes Temperature / Pain Temperature: No Abnormality Moisture No Abnormalities Noted: No Dry / Scaly: No Maceration: No Treatment Notes Wound #1 (Lower Leg) Wound Laterality: Right, Anterior Cleanser Soap and Water Discharge Instruction: May shower and wash wound with dial antibacterial soap and water prior to dressing change. Wound Cleanser Discharge Instruction: Cleanse the wound with wound cleanser prior to applying a clean dressing using gauze sponges, not tissue or cotton balls. Peri-Wound Care Triamcinolone 15 (g) Discharge Instruction: Use triamcinolone 15 (g) as directed Megill, [name] ([id number]) [id number].pdf Page 7 of 7 Sween Lotion (Moisturizing lotion) Discharge Instruction: Apply moisturizing lotion as directed Topical Mupirocin Ointment Discharge Instruction: Apply Mupirocin (Bactroban) as instructed Primary Dressing Sorbalgon AG Dressing, 4x4 (in/in) Discharge Instruction: Apply to wound bed as instructed Secondary Dressing Woven Gauze Sponge, Non-Sterile 4x4 in Discharge Instruction: Apply over primary dressing as directed. Zetuvit Plus 4x8 in Discharge Instruction: Apply over primary dressing as directed. Secured With Compression Wrap ThreePress (3 layer compression wrap) Discharge Instruction: Apply three layer compression as directed. Compression Stockings Add-Ons Electronic Signature(s) Signed: 12/18/2022 12:11:12 PM By: Dellie Catholic RN Entered By: Dellie Catholic on 12/18/2022 12:09:40 -------------------------------------------------------------------------------- Vitals Details Patient Name: Date of Service: PA DDISO [name] 12/18/2022 11:00 A M Medical Record Number: [id number] Patient Account Number: [id number] Date of Birth/Sex: Treating RN: 17-Sep-1941 (82 y.o. [name] Primary Care Jerol Rufener: Terrill Mohr Other Clinician: Referring Destinie Thornsberry: Treating Meher Kucinski/Extender: Serita Kyle Weeks in Treatment: 7 Vital Signs Time Taken: 11:24 Temperature (F): 98.1 Height (in): 64 Pulse (bpm): 89 Weight (lbs): 180 Respiratory Rate (breaths/min): 18 Body  Mass Index (BMI): 30.9 Blood Pressure (mmHg): 125/86 Reference Range: 80 - 120 mg / dl Electronic Signature(s) Signed: 12/18/2022 12:11:12 PM By: Karie Schwalbe RN Entered By: Karie Schwalbe on 12/18/2022 11:26:19

## 2022-12-18 NOTE — Progress Notes (Signed)
[name] ([id number]) [id number] Page 1 of 9 Visit Report for 12/18/2022 Chief Complaint Document Details Patient Name: Date of Service: PA DDISO [name] [facility] 12/18/2022 11:00 A M Medical Record Number: [id number] Patient Account Number: [id number] Date of Birth/Sex: Treating RN: 1941-04-20 (82 y.o. F) Primary Care Provider: Terrill Mohr Other Clinician: Referring Provider: Treating Provider/Extender: Renaldo Harrison in Treatment: 7 Information Obtained from: Patient Chief Complaint Patient presents for treatment of open ulcers due to venous insufficiency Electronic Signature(s) Signed: 12/18/2022 11:50:07 AM By: Fredirick Maudlin MD FACS Entered By: Fredirick Maudlin on 12/18/2022 11:50:07 -------------------------------------------------------------------------------- Debridement Details Patient Name: Date of Service: PA DDISO [name] 12/18/2022 11:00 A M Medical Record Number: [id number] Patient Account Number: [id number] Date of Birth/Sex: Treating RN: [birth year] (82 y.o. [name] Primary Care Provider: Terrill Mohr Other Clinician: Referring Provider: Treating Provider/Extender: Serita Kyle Weeks in Treatment: 7 Debridement Performed for Assessment: Wound #1 Right,Anterior Lower Leg Performed By: Physician Fredirick Maudlin, MD Debridement Type: Debridement Severity of Tissue Pre Debridement: Fat layer exposed Level of Consciousness (Pre-procedure): Awake and Alert Pre-procedure Verification/Time Out Yes - 11:40 Taken: Start Time: 11:40 Pain Control: Lidocaine 4% T opical Solution T Area Debrided (L x W): otal 7.1 (cm) x 3.8 (cm) = 26.98 (cm) Tissue and other material debrided: Non-Viable, Eschar, Slough, Slough Level: Non-Viable Tissue Debridement Description: Selective/Open Wound Instrument: Curette Bleeding: Minimum Hemostasis Achieved: Pressure End Time: 11:42 Procedural  Pain: 0 Post Procedural Pain: 0 Response to Treatment: Procedure was tolerated well Level of Consciousness (Post- Awake and Alert procedure): Post Debridement Measurements of Total Wound Length: (cm) 7.1 Width: (cm) 3.8 Depth: (cm) 0.1 Volume: (cm) 2.119 Character of Wound/Ulcer Post Debridement: Improved Severity of Tissue Post Debridement: Fat layer exposed Sally, Haely ([id number]) [id number] Page 2 of 9 Post Procedure Diagnosis Same as Pre-procedure Notes Scribed for Dr. Celine Ahr by J.Scotton Electronic Signature(s) Signed: 12/18/2022 12:10:53 PM By: Fredirick Maudlin MD FACS Signed: 12/18/2022 12:11:12 PM By: Dellie Catholic RN Entered By: Dellie Catholic on 12/18/2022 11:43:38 -------------------------------------------------------------------------------- HPI Details Patient Name: Date of Service: PA DDISO [name] 12/18/2022 11:00 A M Medical Record Number: [id number] Patient Account Number: [id number] Date of Birth/Sex: Treating RN: 04-Nov-1941 (82 y.o. 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 The patient and her family elected for comfort care however somehow she managed to heal her wounds. From the electronic medical record, it looks like she was receiving home health services and being followed by her primary care provider. At the beginning of November, she saw her PCP again and had new ulcers on her right leg. Home health services were not available to assist the patient and she was referred to the wound  care center for further evaluation and management. On the right anterior lower leg, she has multiple ulcers of varying degrees and depths. Some are limited to breakdown of skin and others are deeper, into the fat layer. There is slough and some eschar accumulation. She has periwound erythema and 1-2+ edema. 12/14-second visit for this woman who arrived to clinic accompanied by a neighbor. 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  She tells me that in 2021 she had bilateral lower leg wounds which took an ordinarily long period of time to healo 2 years. The current wounds have been on the right leg for about 2 months. She may or may not have been wearing a support stocking on the right leg. She has not been using anything on the left but fortunately does not have any wounds there. She makes it clear to me that she cannot get standard over the toe stockings on herself. Her husband passed away sometime earlier this year I believe 11/13/2022: All of the wounds seem to have progressed, getting larger and at this point and the fat layer is exposed in all locations. There is slough accumulation on all surfaces. 11/20/2022: Her wounds continue to worsen. They have expanded and there is thick slough on all of the wound surfaces. She is experiencing more pain. 11/28/2022: The cultures that I took last week grew out methicillin-resistant Staph aureus so we discontinued Augmentin and initiated doxycycline. She is currently taking this. Her wounds are less painful today. They are all about the same size, but they have not expanded. There is rubbery slough on the wound surfaces. 12/04/2022: The wounds look improved today after being on doxycycline and using topical mupirocin. There is still a fair amount of slough accumulation. 12/11/2022: The wounds are smaller again this week. They continue to accumulate some slough and remain tender. She has completed the oral doxycycline. 12/18/2022: The  wounds continue to contract. A couple of the smaller ones have scabbed over. There is slough and eschar accumulation. They are less tender than on prior occasions. Electronic Signature(s) Signed: 12/18/2022 11:50:41 AM By: Fredirick Maudlin MD FACS Entered By: Fredirick Maudlin on 12/18/2022 11:50:41 Physical Exam Details -------------------------------------------------------------------------------- [name] ([id number]) [id number] Page 3 of 9 Patient Name: Date of Service: PA DDISO [name] 12/18/2022 11:00 A M Medical Record Number: [id number] Patient Account Number: [id number] Date of Birth/Sex: Treating RN: October 26, 1941 (82 y.o. F) Primary Care Provider: Terrill Mohr Other Clinician: Referring Provider: Treating Provider/Extender: Serita Kyle Weeks in Treatment: 7 Constitutional . . . . no acute distress. Respiratory Normal work of breathing on room air. Notes 12/18/2022: The wounds continue to contract. A couple of the smaller ones have scabbed over. There is slough and eschar accumulation. They are less tender than on prior occasions. Electronic Signature(s) Signed: 12/18/2022 11:51:47 AM By: Fredirick Maudlin MD FACS Entered By: Fredirick Maudlin on 12/18/2022 11:51:47 -------------------------------------------------------------------------------- Physician Orders Details Patient Name: Date of Service: PA DDISO [name] 12/18/2022 11:00 A M Medical Record Number: [id number] Patient Account Number: [id number] Date of Birth/Sex: Treating RN: [birth year] (82 y.o. [name] Primary Care Provider: Terrill Mohr Other Clinician: Referring Provider: Treating Provider/Extender: Renaldo Harrison in Treatment: 7 Verbal / Phone Orders: No Diagnosis Coding ICD-10 Coding Code Description [id number] Non-pressure chronic ulcer of other part of right lower leg with fat layer exposed I87.2 Venous  insufficiency (chronic) (peripheral) J44.89 Other specified chronic obstructive pulmonary disease I73.9 Peripheral vascular disease, unspecified Follow-up Appointments ppointment in 1 week. - Dr. Celine Ahr - Room 3 Return A Anesthetic (In clinic) Topical Lidocaine 5% applied to wound bed Bathing/ Shower/ Hygiene Other Bathing/Shower/Hygiene Orders/Instructions: - May take a sponge bath Edema Control - Lymphedema / SCD / Other Elevate legs to the level of the heart or above for 30 minutes daily and/or when sitting for 3-4 times a day throughout the day. Avoid standing for long periods of time. Exercise  regularly Moisturize legs daily. - Use lotion on left leg-STOP using the steroid cream Wound Treatment Wound #1 - Lower Leg Wound Laterality: Right, Anterior Cleanser: Soap and Water 1 x Per Week/30 Days Discharge Instructions: May shower and wash wound with dial antibacterial soap and water prior to dressing change. Cleanser: Wound Cleanser 1 x Per Week/30 Days Discharge Instructions: Cleanse the wound with wound cleanser prior to applying a clean dressing using gauze sponges, not tissue or cotton balls. Peri-Wound Care: Triamcinolone 15 (g) 1 x Per Week/30 Days Discharge Instructions: Use triamcinolone 15 (g) as directed Sandlin, [name] ([id number]) [id number] Page 4 of 9 Peri-Wound Care: Sween Lotion (Moisturizing lotion) 1 x Per Week/30 Days Discharge Instructions: Apply moisturizing lotion as directed Topical: Mupirocin Ointment 1 x Per Week/30 Days Discharge Instructions: Apply Mupirocin (Bactroban) as instructed Prim Dressing: Sorbalgon AG Dressing, 4x4 (in/in) 1 x Per Week/30 Days ary Discharge Instructions: Apply to wound bed as instructed Secondary Dressing: Woven Gauze Sponge, Non-Sterile 4x4 in 1 x Per Week/30 Days Discharge Instructions: Apply over primary dressing as directed. Secondary Dressing: Zetuvit Plus 4x8 in 1 x Per Week/30 Days Discharge  Instructions: Apply over primary dressing as directed. Compression Wrap: ThreePress (3 layer compression wrap) 1 x Per Week/30 Days Discharge Instructions: Apply three layer compression as directed. Electronic Signature(s) Signed: 12/18/2022 12:10:53 PM By: Duanne Guess MD FACS Entered By: Duanne Guess on 12/18/2022 11:52:11 -------------------------------------------------------------------------------- Problem List Details Patient Name: Date of Service: PA DDISO [name] 12/18/2022 11:00 A M Medical Record Number: [id number] Patient Account Number: [id number] Date of Birth/Sex: Treating RN: 04-Mar-1941 (82 y.o. F) Primary Care Provider: Brett Fairy Other Clinician: Referring Provider: Treating Provider/Extender: [name] Weeks in Treatment: 7 Active Problems ICD-10 Encounter Code Description Active Date MDM Diagnosis [phone] Non-pressure chronic ulcer of other part of right lower leg with fat layer 10/30/2022 No Yes exposed I87.2 Venous insufficiency (chronic) (peripheral) 10/30/2022 No Yes J44.89 Other specified chronic obstructive pulmonary disease 10/30/2022 No Yes I73.9 Peripheral vascular disease, unspecified 10/30/2022 No Yes Inactive Problems Resolved Problems Electronic Signature(s) Signed: 12/18/2022 11:45:41 AM By: Duanne Guess MD FACS Entered By: Duanne Guess on 12/18/2022 11:45:41 Fratto, [name] ([id number]) [id number] Page 5 of 9 -------------------------------------------------------------------------------- Progress Note Details Patient Name: Date of Service: PA DDISO [name] 12/18/2022 11:00 A M Medical Record Number: [id number] Patient Account Number: [id number] Date of Birth/Sex: Treating RN: Apr 23, 1941 (82 y.o. 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 The patient and her family elected for comfort care however somehow she managed to heal her wounds. From the electronic medical record, it looks like she was receiving home health services and being followed by her primary care provider. At the beginning of November, she saw her PCP again and had new ulcers on her right leg. Home health services were not available to assist the patient and she was referred to the wound care center for further evaluation and management. On the right anterior lower leg, she has multiple ulcers of varying degrees and depths. Some are limited to breakdown of  skin and others are deeper, into the fat layer. There is slough and some eschar accumulation. She has periwound erythema and 1-2+ edema. 12/14-second visit for this woman who arrived to clinic accompanied by a neighbor. 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 She tells me that in 2021 she had bilateral lower leg wounds which took an ordinarily long period of time to healo 2 years. The current wounds have been on the right leg for about 2 months. She may or may not have been wearing a support stocking on the right leg. She has not been using anything on the left but fortunately does not have any wounds there. She makes it clear to me that she cannot get standard over the toe stockings on herself. Her husband passed away sometime earlier this year I believe 11/13/2022: All of the wounds seem to have progressed, getting larger and at this point and  the fat layer is exposed in all locations. There is slough accumulation on all surfaces. 11/20/2022: Her wounds continue to worsen. They have expanded and there is thick slough on all of the wound surfaces. She is experiencing more pain. 11/28/2022: The cultures that I took last week grew out methicillin-resistant Staph aureus so we discontinued Augmentin and initiated doxycycline. She is currently taking this. Her wounds are less painful today. They are all about the same size, but they have not expanded. There is rubbery slough on the wound surfaces. 12/04/2022: The wounds look improved today after being on doxycycline and using topical mupirocin. There is still a fair amount of slough accumulation. 12/11/2022: The wounds are smaller again this week. They continue to accumulate some slough and remain tender. She has completed the oral doxycycline. 12/18/2022: The wounds continue to contract. A couple of the smaller ones have scabbed over. There is slough and eschar accumulation. They are less tender than on prior occasions. Patient History Information obtained from Patient. Social History Never smoker, Marital Status - Widowed, Alcohol Use - Never, Drug Use - No History, Caffeine Use - Daily. Medical History Respiratory Patient has history of Asthma Cardiovascular Patient has history of Hypertension Musculoskeletal Patient has history of Osteoarthritis Hospitalization/Surgery History - Bilateral Cataract Extraction; Bone Graft-jaw; Arm Surgery (d/t dog bite). Medical A Surgical History Notes nd Eyes Hx: Cataracts Ear/Nose/Mouth/Throat Hx: wearing Bilateral hearing aids Genitourinary Chronic Renal Impairment Stage 3 Psychiatric Hx: Depression Hubert, Aizah ([id number]) [id number] Page 6 of 9 Objective Constitutional no acute distress. Vitals Time Taken: 11:24 AM, Height: 64 in, Weight: 180 lbs, BMI: 30.9, Temperature: 98.1 F, Pulse: 89 bpm, Respiratory  Rate: 18 breaths/min, Blood Pressure: 125/86 mmHg. Respiratory Normal work of breathing on room air. General Notes: 12/18/2022: The wounds continue to contract. A couple of the smaller ones have scabbed over. There is slough and eschar accumulation. They are less tender than on prior occasions. Integumentary (Hair, Skin) Wound #1 status is Open. Original cause of wound was Gradually Appeared. The date acquired was: 09/30/2022. The wound has been in treatment 7 weeks. The wound is located on the Right,Anterior Lower Leg. The wound measures 7.1cm length x 4.8cm width x 0.1cm depth; 26.766cm^2 area and 2.677cm^3 volume. There is Fat Layer (Subcutaneous Tissue) exposed. There is no tunneling or undermining noted. There is a large amount of serosanguineous drainage noted. The wound margin is distinct with the outline attached to the wound base. There is medium (34-66%) red, hyper - granulation within the wound bed. There is a medium (34-66%) amount of necrotic tissue  within the wound bed including Eschar and Adherent Slough. The periwound skin appearance exhibited: Scarring, Hemosiderin Staining. The periwound skin appearance did not exhibit: Excoriation, Dry/Scaly, Maceration. Periwound temperature was noted as No Abnormality. Assessment Active Problems ICD-10 Non-pressure chronic ulcer of other part of right lower leg with fat layer exposed Venous insufficiency (chronic) (peripheral) Other specified chronic obstructive pulmonary disease Peripheral vascular disease, unspecified Procedures Wound #1 Pre-procedure diagnosis of Wound #1 is a Venous Leg Ulcer located on the Right,Anterior Lower Leg .Severity of Tissue Pre Debridement is: Fat layer exposed. There was a Selective/Open Wound Non-Viable Tissue Debridement with a total area of 26.98 sq cm performed by Fredirick Maudlin, MD. With the following instrument(s): Curette to remove Non-Viable tissue/material. Material removed includes Eschar and  Slough and after achieving pain control using Lidocaine 4% T opical Solution. No specimens were taken. A time out was conducted at 11:40, prior to the start of the procedure. A Minimum amount of bleeding was controlled with Pressure. The procedure was tolerated well with a pain level of 0 throughout and a pain level of 0 following the procedure. Post Debridement Measurements: 7.1cm length x 3.8cm width x 0.1cm depth; 2.119cm^3 volume. Character of Wound/Ulcer Post Debridement is improved. Severity of Tissue Post Debridement is: Fat layer exposed. Post procedure Diagnosis Wound #1: Same as Pre-Procedure General Notes: Scribed for Dr. Celine Ahr by J.Scotton. Pre-procedure diagnosis of Wound #1 is a Venous Leg Ulcer located on the Right,Anterior Lower Leg . There was a Three Layer Compression Therapy Procedure by Dellie Catholic, RN. Post procedure Diagnosis Wound #1: Same as Pre-Procedure Plan Follow-up Appointments: Return Appointment in 1 week. - Dr. Celine Ahr - Room 3 Anesthetic: (In clinic) Topical Lidocaine 5% applied to wound bed Bathing/ Shower/ Hygiene: Other Bathing/Shower/Hygiene Orders/Instructions: - May take a sponge bath Edema Control - Lymphedema / SCD / Other: Elevate legs to the level of the heart or above for 30 minutes daily and/or when sitting for 3-4 times a day throughout the day. Avoid standing for long periods of time. Exercise regularly Moisturize legs daily. - Use lotion on left leg-STOP using the steroid cream WOUND #1: - Lower Leg Wound Laterality: Right, Anterior Cleanser: Soap and Water 1 x Per Week/30 Days Discharge Instructions: May shower and wash wound with dial antibacterial soap and water prior to dressing change. Cleanser: Wound Cleanser 1 x Per Week/30 Days Lutterman, [name] ([id number]) [phone].pdf Page 7 of 9 Discharge Instructions: Cleanse the wound with wound cleanser prior to applying a clean dressing using gauze sponges, not  tissue or cotton balls. Peri-Wound Care: Triamcinolone 15 (g) 1 x Per Week/30 Days Discharge Instructions: Use triamcinolone 15 (g) as directed Peri-Wound Care: Sween Lotion (Moisturizing lotion) 1 x Per Week/30 Days Discharge Instructions: Apply moisturizing lotion as directed Topical: Mupirocin Ointment 1 x Per Week/30 Days Discharge Instructions: Apply Mupirocin (Bactroban) as instructed Prim Dressing: Sorbalgon AG Dressing, 4x4 (in/in) 1 x Per Week/30 Days ary Discharge Instructions: Apply to wound bed as instructed Secondary Dressing: Woven Gauze Sponge, Non-Sterile 4x4 in 1 x Per Week/30 Days Discharge Instructions: Apply over primary dressing as directed. Secondary Dressing: Zetuvit Plus 4x8 in 1 x Per Week/30 Days Discharge Instructions: Apply over primary dressing as directed. Com pression Wrap: ThreePress (3 layer compression wrap) 1 x Per Week/30 Days Discharge Instructions: Apply three layer compression as directed. 12/18/2022: The wounds continue to contract. A couple of the smaller ones have scabbed over. There is slough and eschar accumulation. They are less tender than on prior occasions. I  used a curette to debride slough and eschar off of each of the wounds. We will continue topical mupirocin with silver alginate and 3 layer compression. Follow-up in 1 week. Electronic Signature(s) Signed: 12/18/2022 11:52:38 AM By: Fredirick Maudlin MD FACS Entered By: Fredirick Maudlin on 12/18/2022 11:52:37 -------------------------------------------------------------------------------- HxROS Details Patient Name: Date of Service: PA DDISO [name] 12/18/2022 11:00 A M Medical Record Number: [id number] Patient Account Number: [id number] Date of Birth/Sex: Treating RN: 1941/01/16 (82 y.o. F) Primary Care Provider: Terrill Mohr Other Clinician: Referring Provider: Treating Provider/Extender: Renaldo Harrison in Treatment: 7 Information Obtained  From Patient Eyes Medical History: Past Medical History Notes: Hx: Cataracts Ear/Nose/Mouth/Throat Medical History: Past Medical History Notes: Hx: wearing Bilateral hearing aids Respiratory Medical History: Positive for: Asthma Cardiovascular Medical History: Positive for: Hypertension Genitourinary Medical History: Past Medical History Notes: Chronic Renal Impairment Stage 3 Musculoskeletal Hlavacek, [name] ([id number]) [id number] Page 8 of 9 Medical History: Positive for: Osteoarthritis Psychiatric Medical History: Past Medical History Notes: Hx: Depression Immunizations Pneumococcal Vaccine: Received Pneumococcal Vaccination: Yes Received Pneumococcal Vaccination On or After 60th Birthday: Yes Implantable Devices None Hospitalization / Surgery History Type of Hospitalization/Surgery Bilateral Cataract Extraction; Bone Graft-jaw; Arm Surgery (d/t dog bite) Family and Social History Never smoker; Marital Status - Widowed; Alcohol Use: Never; Drug Use: No History; Caffeine Use: Daily; Financial Concerns: No; Food, Clothing or Shelter Needs: No; Support System Lacking: No; Transportation Concerns: No Electronic Signature(s) Signed: 12/18/2022 12:10:53 PM By: Fredirick Maudlin MD FACS Entered By: Fredirick Maudlin on 12/18/2022 11:51:00 -------------------------------------------------------------------------------- SuperBill Details Patient Name: Date of Service: PA DDISO [name] 12/18/2022 Medical Record Number: [id number] Patient Account Number: [id number] Date of Birth/Sex: Treating RN: 01/29/1941 (81 y.o. F) Primary Care Provider: Terrill Mohr Other Clinician: Referring Provider: Treating Provider/Extender: Serita Kyle Weeks in Treatment: 7 Diagnosis Coding ICD-10 Codes Code Description [phone] Non-pressure chronic ulcer of other part of right lower leg with fat layer exposed I87.2 Venous insufficiency  (chronic) (peripheral) J44.89 Other specified chronic obstructive pulmonary disease I73.9 Peripheral vascular disease, unspecified Facility Procedures : CPT4 Code: [id number] Description: 68341 - DEBRIDE WOUND 1ST 20 SQ CM OR < ICD-10 Diagnosis Description L97.812 Non-pressure chronic ulcer of other part of right lower leg with fat layer expos Modifier: ed Quantity: 1 : CPT4 Code: [id number] Description: 89211 - DEBRIDE WOUND EA ADDL 20 SQ CM ICD-10 Diagnosis Description L97.812 Non-pressure chronic ulcer of other part of right lower leg with fat layer expos Modifier: ed Quantity: 1 Physician Procedures : CPT4 Code Description Modifier [id number] 99214 - WC PHYS LEVEL 4 - EST PT 25 ICD-10 Diagnosis Description All, Ramla ([id number]) [id number] L97.812 Non-pressure chronic ulcer of other part of right lower leg with fat layer  exposed I87.2 Venous insufficiency (chronic) (peripheral) I73.9 Peripheral vascular disease, unspecified J44.89 Other specified chronic obstructive pulmonary disease Quantity: 1 227.pdf Page 9 of 9 : [id number] - WC PHYS DEBR WO ANESTH 20 SQ CM 1 ICD-10 Diagnosis Description G83.662 Non-pressure chronic ulcer of other part of right lower leg with fat layer exposed Quantity: : [id number] - WC PHYS DEBR WO ANESTH EA ADD 20 CM 1 ICD-10 Diagnosis Description S56.812 Non-pressure chronic ulcer of other part of right lower leg with fat layer exposed Quantity: Electronic Signature(s) Signed: 12/18/2022 11:52:57 AM By: Fredirick Maudlin MD FACS Entered By: Fredirick Maudlin on 12/18/2022 11:52:56

## 2022-12-25 ENCOUNTER — Encounter (HOSPITAL_BASED_OUTPATIENT_CLINIC_OR_DEPARTMENT_OTHER): Payer: Medicare Other | Attending: General Surgery | Admitting: General Surgery

## 2022-12-25 DIAGNOSIS — I872 Venous insufficiency (chronic) (peripheral): Secondary | ICD-10-CM | POA: Diagnosis not present

## 2022-12-25 DIAGNOSIS — L97812 Non-pressure chronic ulcer of other part of right lower leg with fat layer exposed: Secondary | ICD-10-CM | POA: Insufficient documentation

## 2022-12-25 DIAGNOSIS — I739 Peripheral vascular disease, unspecified: Secondary | ICD-10-CM | POA: Diagnosis not present

## 2022-12-25 DIAGNOSIS — J4489 Other specified chronic obstructive pulmonary disease: Secondary | ICD-10-CM | POA: Insufficient documentation

## 2022-12-25 NOTE — Progress Notes (Signed)
[name] ([id number]) [id number] Page 1 of 8 Visit Report for 12/25/2022 Arrival Information Details Patient Name: Date of Service: [name] [facility] 12/25/2022 11:00 A M Medical Record Number: [id number] Patient Account Number: [id number] Date of Birth/Sex: Treating RN: Oct 31, 1941 (82 y.o. [name] Primary Care [name]: Terrill Mohr Other Clinician: Referring [name]: Treating [name]/Extender: Renaldo Harrison in Treatment: 8 Visit Information History Since Last Visit Added or deleted any medications: No Patient Arrived: [name] Any new allergies or adverse reactions: No Arrival Time: 11:05 Had a fall or experienced change in No Accompanied By: friend activities of daily living that may affect Transfer Assistance: Manual risk of falls: Patient Identification Verified: Yes Signs or symptoms of abuse/neglect since last visito No Hospitalized since last visit: No Implantable device outside of the clinic excluding No cellular tissue based products placed in the center since last visit: Has Dressing in Place as Prescribed: Yes Pain Present Now: No Electronic Signature(s) Signed: 12/25/2022 4:55:54 PM By: Dellie Catholic RN Entered By: Dellie Catholic on 12/25/2022 11:33:40 -------------------------------------------------------------------------------- Compression Therapy Details Patient Name: Date of Service: [name] 12/25/2022 11:00 A M Medical Record Number: [id number] Patient Account Number: [id number] Date of Birth/Sex: Treating RN: 01-May-1941 (82 y.o. [name] Primary Care Jasara Corrigan: Terrill Mohr Other Clinician: Referring Scout Gumbs: Treating [name]/Extender: Serita Kyle Weeks in Treatment: 8 Compression Therapy Performed for Wound Assessment: Wound #1 Right,Anterior Lower Leg Performed By: Clinician Dellie Catholic, RN Compression Type: Three Layer Post  Procedure Diagnosis Same as Pre-procedure Electronic Signature(s) Signed: 12/25/2022 11:38:25 AM By: Dellie Catholic RN Entered By: Dellie Catholic on 12/25/2022 11:38:24 [name], [name] Bamberg ([id number]) [id number] Page 2 of 8 -------------------------------------------------------------------------------- Encounter Discharge Information Details Patient Name: Date of Service: [name] 12/25/2022 11:00 A M Medical Record Number: [id number] Patient Account Number: [id number] Date of Birth/Sex: Treating RN: Feb 25, 1941 (82 y.o. [name] Primary Care Phylisha Dix: Terrill Mohr Other Clinician: Referring Brannan Cassedy: Treating [name]/Extender: Serita Kyle Weeks in Treatment: 8 Encounter Discharge Information Items Post Procedure Vitals Discharge Condition: Stable Temperature (F): 97.6 Ambulatory Status: Cane Pulse (bpm): 87 Discharge Destination: Home Respiratory Rate (breaths/min): 18 Transportation: Private Auto Blood Pressure (mmHg): 138/78 Accompanied By: friend Schedule Follow-up Appointment: Yes Clinical Summary of Care: Patient Declined Electronic Signature(s) Signed: 12/25/2022 4:55:54 PM By: Dellie Catholic RN Entered By: Dellie Catholic on 12/25/2022 12:00:43 -------------------------------------------------------------------------------- Lower Extremity Assessment Details Patient Name: Date of Service: [name] 12/25/2022 11:00 A M Medical Record Number: [id number] Patient Account Number: [id number] Date of Birth/Sex: Treating RN: 01-17-1941 (82 y.o. [name] Primary Care Maecy Podgurski: Terrill Mohr Other Clinician: Referring Carle Fenech: Treating [name]/Extender: Serita Kyle Weeks in Treatment: 8 Edema Assessment Assessed: [Left: No] [Right: No] [Left: Edema] [Right: :] Calf Left: Right: Point of Measurement: 36 cm From Medial Instep 44 cm 38.8 cm Ankle Left:  Right: Point of Measurement: 10 cm From Medial Instep 26 cm 24.8 cm Vascular Assessment Pulses: Dorsalis Pedis Palpable: [Left:Yes] [Right:Yes] Electronic Signature(s) Signed: 12/25/2022 4:55:54 PM By: Dellie Catholic RN Entered By: Dellie Catholic on 12/25/2022 11:34:37 Multi Wound Chart Details -------------------------------------------------------------------------------- [name] ([id number]) [id number] Page 3 of 8 Patient Name: Date of Service: [name] 12/25/2022 11:00 A M Medical Record Number: [id number] Patient Account Number: [id number] Date of Birth/Sex: Treating RN: 03-Oct-1941 (82 y.o. F) Primary Care [name]: Terrill Mohr Other Clinician: Referring Dana Debo: Treating [name]/Extender: Serita Kyle Weeks in Treatment: 8 Vital Signs Height(in):  64 Pulse(bpm): 87 Weight(lbs): 180 Blood Pressure(mmHg): 138/78 Body Mass Index(BMI): 30.9 Temperature(F): 97.6 Respiratory Rate(breaths/min): 18 Wound Assessments Wound Number: 1 N/A N/A Photos: N/A N/A Right, Anterior Lower Leg N/A N/A Wound Location: Gradually Appeared N/A N/A Wounding Event: Venous Leg Ulcer N/A N/A Primary Etiology: Asthma, Hypertension, Osteoarthritis N/A N/A Comorbid History: 09/30/2022 N/A N/A Date Acquired: 8 N/A N/A Weeks of Treatment: Open N/A N/A Wound Status: No N/A N/A Wound Recurrence: Yes N/A N/A Clustered Wound: 7.1x1x0.1 N/A N/A Measurements L x W x D (cm) 5.576 N/A N/A A (cm) : rea 0.558 N/A N/A Volume (cm) : 85.70% N/A N/A % Reduction in A rea: 85.60% N/A N/A % Reduction in Volume: Full Thickness Without Exposed N/A N/A Classification: Support Structures Large N/A N/A Exudate A mount: Serosanguineous N/A N/A Exudate Type: red, [name] N/A N/A Exudate Color: Distinct, outline attached N/A N/A Wound Margin: Medium (34-66%) N/A N/A Granulation A mount: Red, Hyper-granulation N/A  N/A Granulation Quality: Medium (34-66%) N/A N/A Necrotic A mount: Eschar, Adherent Slough N/A N/A Necrotic Tissue: Fat Layer (Subcutaneous Tissue): Yes N/A N/A Exposed Structures: Fascia: No Tendon: No Muscle: No Joint: No Bone: No Small (1-33%) N/A N/A Epithelialization: Debridement - Selective/Open Wound N/A N/A Debridement: Pre-procedure Verification/Time Out 11:28 N/A N/A Taken: Lidocaine 4% Topical Solution N/A N/A Pain Control: Necrotic/Eschar, Slough N/A N/A Tissue Debrided: Non-Viable Tissue N/A N/A Level: 7.1 N/A N/A Debridement A (sq cm): rea Curette N/A N/A Instrument: Minimum N/A N/A Bleeding: Pressure N/A N/A Hemostasis A chieved: 0 N/A N/A Procedural Pain: 0 N/A N/A Post Procedural Pain: Procedure was tolerated well N/A N/A Debridement Treatment Response: 7.1x1x0.1 N/A N/A Post Debridement Measurements L x W x D (cm) 0.558 N/A N/A Post Debridement Volume: (cm) Scarring: Yes N/A N/A Periwound Skin Texture: Excoriation: No Maceration: No N/A N/A Periwound Skin Moisture: Dry/Scaly: No Hemosiderin Staining: Yes N/A N/A Periwound Skin Color: No Abnormality N/A N/A Temperature: Compression Therapy N/A N/A Procedures Performed: Debridement Savarino, [name] ([id number]) [id number] Page 4 of 8 Treatment Notes Wound #1 (Lower Leg) Wound Laterality: Right, Anterior Cleanser Soap and Water Discharge Instruction: May shower and wash wound with dial antibacterial soap and water prior to dressing change. Wound Cleanser Discharge Instruction: Cleanse the wound with wound cleanser prior to applying a clean dressing using gauze sponges, not tissue or cotton balls. Peri-Wound Care Sween Lotion (Moisturizing lotion) Discharge Instruction: Apply moisturizing lotion as directed Topical Mupirocin Ointment Discharge Instruction: Apply Mupirocin (Bactroban) as instructed Primary Dressing Sorbalgon AG Dressing, 4x4  (in/in) Discharge Instruction: Apply to wound bed as instructed Secondary Dressing Woven Gauze Sponge, Non-Sterile 4x4 in Discharge Instruction: Apply over primary dressing as directed. Zetuvit Plus 4x8 in Discharge Instruction: Apply over primary dressing as directed. Secured With Compression Wrap ThreePress (3 layer compression wrap) Discharge Instruction: Apply three layer compression as directed. Compression Stockings Add-Ons Electronic Signature(s) Signed: 12/25/2022 12:12:08 PM By: Duanne Guess MD FACS Entered By: Duanne Guess on 12/25/2022 12:12:08 -------------------------------------------------------------------------------- Multi-Disciplinary Care Plan Details Patient Name: Date of Service: [name] 12/25/2022 11:00 A M Medical Record Number: [id number] Patient Account Number: [id number] Date of Birth/Sex: Treating RN: 05-Aug-1941 (82 y.o. [name] Primary Care Indie Boehne: Brett Fairy Other Clinician: Referring Kamarri Fischetti: Treating Kendahl Bumgardner/Extender: [name] Weeks in Treatment: 8 Active Inactive Wound/Skin Impairment Nursing Diagnoses: Impaired tissue integrity Goals: Patient/caregiver will verbalize understanding of skin care regimen Date Initiated: 10/30/2022 Target Resolution Date: 03/24/2023 Goal Status: Active Interventions: CORLEEN, OTWELL ([id number]) [phone].pdf Page 5 of 8 Assess ulceration(s)  every visit Treatment Activities: Skin care regimen initiated : 10/30/2022 Notes: Electronic Signature(s) Signed: 12/25/2022 4:55:54 PM By: Dellie Catholic RN Entered By: Dellie Catholic on 12/25/2022 11:58:45 -------------------------------------------------------------------------------- Pain Assessment Details Patient Name: Date of Service: [name] 12/25/2022 11:00 A M Medical Record Number: [id number] Patient Account Number: [id number] Date of Birth/Sex: Treating RN: 1941/11/11  (82 y.o. [name] Primary Care Reginald Weida: Terrill Mohr Other Clinician: Referring Devaun Hernandez: Treating [name]/Extender: Serita Kyle Weeks in Treatment: 8 Active Problems Location of Pain Severity and Description of Pain Patient Has Paino No Site Locations Pain Management and Medication Current Pain Management: Electronic Signature(s) Signed: 12/25/2022 4:55:54 PM By: Dellie Catholic RN Entered By: Dellie Catholic on 12/25/2022 11:34:28 -------------------------------------------------------------------------------- Patient/Caregiver Education Details Patient Name: Date of Service: [name] 2/1/2024andnbsp11:00 A M Medical Record Number: [id number] Patient Account Number: [id number] Date of Birth/Gender: Treating RN: 1940-12-24 (82 y.o. [name] Primary Care Physician: Terrill Mohr Other Clinician: Referring Physician: Treating Physician/Extender: Serita Kyle Weeks in Treatment: 8 Tartaglia, [name] Bamberg ([id number]) [id number] Page 6 of 8 Education Assessment Education Provided To: Patient Education Topics Provided Wound/Skin Impairment: Methods: Explain/Verbal Responses: Return demonstration correctly Electronic Signature(s) Signed: 12/25/2022 4:55:54 PM By: Dellie Catholic RN Entered By: Dellie Catholic on 12/25/2022 11:59:01 -------------------------------------------------------------------------------- Wound Assessment Details Patient Name: Date of Service: [name] 12/25/2022 11:00 A M Medical Record Number: [id number] Patient Account Number: [id number] Date of Birth/Sex: Treating RN: December 09, 1940 (82 y.o. [name] Primary Care Chiniqua Kilcrease: Terrill Mohr Other Clinician: Referring Shatasia Cutshaw: Treating [name]/Extender: Serita Kyle Weeks in Treatment: 8 Wound Status Wound Number: 1 Primary Etiology: Venous Leg Ulcer Wound Location:  Right, Anterior Lower Leg Wound Status: Open Wounding Event: Gradually Appeared Comorbid History: Asthma, Hypertension, Osteoarthritis Date Acquired: 09/30/2022 Weeks Of Treatment: 8 Clustered Wound: Yes Photos Wound Measurements Length: (cm) 7.1 Width: (cm) 1 Depth: (cm) 0.1 Area: (cm) 5.576 Volume: (cm) 0.558 % Reduction in Area: 85.7% % Reduction in Volume: 85.6% Epithelialization: Small (1-33%) Tunneling: No Undermining: No Wound Description Classification: Full Thickness Without Exposed Support Structures Wound Margin: Distinct, outline attached Exudate Amount: Large Exudate Type: Serosanguineous Exudate Color: red, [name] Foul Odor After Cleansing: No Slough/Fibrino Yes Wound Bed Granulation Amount: Medium (34-66%) Exposed Structure Busche, [name] Bamberg ([id number]) [id number] Page 7 of 8 Granulation Quality: Red, Hyper-granulation Fascia Exposed: No Necrotic Amount: Medium (34-66%) Fat Layer (Subcutaneous Tissue) Exposed: Yes Necrotic Quality: Eschar, Adherent Slough Tendon Exposed: No Muscle Exposed: No Joint Exposed: No Bone Exposed: No Periwound Skin Texture Texture Color No Abnormalities Noted: No No Abnormalities Noted: No Excoriation: No Hemosiderin Staining: Yes Scarring: Yes Temperature / Pain Temperature: No Abnormality Moisture No Abnormalities Noted: No Dry / Scaly: No Maceration: No Treatment Notes Wound #1 (Lower Leg) Wound Laterality: Right, Anterior Cleanser Soap and Water Discharge Instruction: May shower and wash wound with dial antibacterial soap and water prior to dressing change. Wound Cleanser Discharge Instruction: Cleanse the wound with wound cleanser prior to applying a clean dressing using gauze sponges, not tissue or cotton balls. Peri-Wound Care Sween Lotion (Moisturizing lotion) Discharge Instruction: Apply moisturizing lotion as directed Topical Mupirocin Ointment Discharge Instruction: Apply  Mupirocin (Bactroban) as instructed Primary Dressing Sorbalgon AG Dressing, 4x4 (in/in) Discharge Instruction: Apply to wound bed as instructed Secondary Dressing Woven Gauze Sponge, Non-Sterile 4x4 in Discharge Instruction: Apply over primary dressing as directed. Zetuvit Plus 4x8 in Discharge Instruction: Apply over primary dressing as directed. Secured With Compression Wrap ThreePress (3 layer compression  wrap) Discharge Instruction: Apply three layer compression as directed. Compression Stockings Add-Ons Electronic Signature(s) Signed: 12/25/2022 4:55:54 PM By: Dellie Catholic RN Entered By: Dellie Catholic on 12/25/2022 11:40:23 -------------------------------------------------------------------------------- Vitals Details Patient Name: Date of Service: [name] 12/25/2022 11:00 A M Medical Record Number: [id number] Patient Account Number: [id number] Date of Birth/Sex: Treating RN: November 27, 1940 (82 y.o. [name] Primary Care Shaketta Rill: Terrill Mohr Other Clinician: Referring Shinita Mac: Treating [name]/Extender: Serita Kyle Briones, [name] Bamberg ([id number]) [id number].pdf Page 8 of 8 Weeks in Treatment: 8 Vital Signs Time Taken: 11:10 Temperature (F): 97.6 Height (in): 64 Pulse (bpm): 87 Weight (lbs): 180 Respiratory Rate (breaths/min): 18 Body Mass Index (BMI): 30.9 Blood Pressure (mmHg): 138/78 Reference Range: 80 - 120 mg / dl Electronic Signature(s) Signed: 12/25/2022 4:55:54 PM By: Dellie Catholic RN Entered By: Dellie Catholic on 12/25/2022 11:34:02

## 2022-12-25 NOTE — Progress Notes (Signed)
[name] ([id number]) [id number] Page 1 of 9 Visit Report for 12/25/2022 Chief Complaint Document Details Patient Name: Date of Service: PA DDISO [name] [facility] 12/25/2022 11:00 A M Medical Record Number: [id number] Patient Account Number: [id number] Date of Birth/Sex: Treating RN: [birth year] (82 y.o. F) Primary Care Provider: Brett [name] Other Clinician: Referring Provider: Treating Provider/Extender: [name] in Treatment: 8 Information Obtained from: Patient Chief Complaint Patient presents for treatment of open ulcers due to venous insufficiency Electronic Signature(s) Signed: 12/25/2022 12:16:44 PM By: [name] Guess MD FACS Entered By: [name] on 12/25/2022 12:16:44 -------------------------------------------------------------------------------- Debridement Details Patient Name: Date of Service: [name] 12/25/2022 11:00 A M Medical Record Number: [id number] Patient Account Number: [id number] Date of Birth/Sex: Treating RN: 1941-03-13 (82 y.o. [name] Primary Care Provider: Brett [name] Other Clinician: Referring Provider: Treating Provider/Extender: [name] Weeks in Treatment: 8 Debridement Performed for Assessment: Wound #1 Right,Anterior Lower Leg Performed By: Physician [name] Guess, MD Debridement Type: Debridement Severity of Tissue Pre Debridement: Fat layer exposed Level of Consciousness (Pre-procedure): Awake and Alert Pre-procedure Verification/Time Out Yes - 11:28 Taken: Start Time: 11:28 Pain Control: Lidocaine 4% T opical Solution T Area Debrided (L x W): otal 7.1 (cm) x 1 (cm) = 7.1 (cm) Tissue and other material debrided: Non-Viable, Eschar, Slough, Slough Level: Non-Viable Tissue Debridement Description: Selective/Open Wound Instrument: Curette Bleeding: Minimum Hemostasis Achieved: Pressure End Time: 11:29 Procedural Pain:  0 Post Procedural Pain: 0 Response to Treatment: Procedure was tolerated well Level of Consciousness (Post- Awake and Alert procedure): Post Debridement Measurements of Total Wound Length: (cm) 7.1 Width: (cm) 1 Depth: (cm) 0.1 Volume: (cm) 0.558 Character of Wound/Ulcer Post Debridement: Improved Severity of Tissue Post Debridement: Fat layer exposed [name], [name] ([id number]) [id number] Page 2 of 9 Post Procedure Diagnosis Same as Pre-procedure Notes Scribed for Dr. Lady [name] by YvonneDawson Electronic Signature(s) Signed: 12/25/2022 12:21:08 PM By: [name] Guess MD FACS Signed: 12/25/2022 4:55:54 PM By: [name] Schwalbe RN Entered By: [name] on 12/25/2022 11:40:42 -------------------------------------------------------------------------------- HPI Details Patient Name: Date of Service: [name] 12/25/2022 11:00 A M Medical Record Number: [id number] Patient Account Number: [id number] Date of Birth/Sex: Treating RN: Mar 04, 1941 (82 y.o. 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 The patient and her family elected for comfort care however somehow she managed to heal her wounds. From the electronic medical record, it looks like she was receiving home health services and being followed by her primary care provider. At the beginning of November, she saw her PCP again and had new ulcers on her right leg. Home health services were not available to assist the patient and she was referred to the wound care center  for further evaluation and management. On the right anterior lower leg, she has multiple ulcers of varying degrees and depths. Some are limited to breakdown of skin and others are deeper, into the fat layer. There is slough and some eschar accumulation. She has periwound erythema and 1-2+ edema. 12/14-second visit for this woman who arrived to clinic accompanied by a neighbor. 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  She tells me that in 2021 she had bilateral lower leg wounds which took an ordinarily long period of time to healo 2 years. The current wounds have been on the right leg for about 2 months. She may or may not have been wearing a support stocking on the right leg. She has not been using anything on the left but fortunately does not have any wounds there. She makes it clear to me that she cannot get standard over the toe stockings on herself. Her husband passed away sometime earlier this year I believe 11/13/2022: All of the wounds seem to have progressed, getting larger and at this point and the fat layer is exposed in all locations. There is slough accumulation on all surfaces. 11/20/2022: Her wounds continue to worsen. They have expanded and there is thick slough on all of the wound surfaces. She is experiencing more pain. 11/28/2022: The cultures that I took last week grew out methicillin-resistant Staph aureus so we discontinued Augmentin and initiated doxycycline. She is currently taking this. Her wounds are less painful today. They are all about the same size, but they have not expanded. There is rubbery slough on the wound surfaces. 12/04/2022: The wounds look improved today after being on doxycycline and using topical mupirocin. There is still a fair amount of slough accumulation. 12/11/2022: The wounds are smaller again this week. They continue to accumulate some slough and remain tender. She has completed the oral doxycycline. 12/18/2022: The wounds continue  to contract. A couple of the smaller ones have scabbed over. There is slough and eschar accumulation. They are less tender than on prior occasions. 12/25/2022: The wounds are all much smaller. A number of them have healed under some eschar. There is still some slough accumulation and perimeter eschar present. She states she is no longer having any pain. Electronic Signature(s) Signed: 12/25/2022 12:17:18 PM By: [name] Guess MD FACS Entered By: [name] on 12/25/2022 12:17:18 [name], [name] Ee ([id number]) [id number] Page 3 of 9 -------------------------------------------------------------------------------- Physical Exam Details Patient Name: Date of Service: [name] 12/25/2022 11:00 A M Medical Record Number: [id number] Patient Account Number: [id number] Date of Birth/Sex: Treating RN: [birth year] (82 y.o. F) Primary Care Provider: Brett [name] Other Clinician: Referring Provider: Treating Provider/Extender: [name] Weeks in Treatment: 8 Constitutional . . . . no acute distress. Respiratory Normal work of breathing on room air. Notes 12/25/2022: The wounds are all much smaller. A number of them have healed under some eschar. There is still some slough accumulation and perimeter eschar present. She states she is no longer having any pain. Electronic Signature(s) Signed: 12/25/2022 12:18:20 PM By: [name] Guess MD FACS Entered By: [name] on 12/25/2022 12:18:20 -------------------------------------------------------------------------------- Physician Orders Details Patient Name: Date of Service: [name] 12/25/2022 11:00 A M Medical Record Number: [id number] Patient Account Number: [id number] Date of Birth/Sex: Treating RN: [birth year] (82 y.o. [name] Primary Care Provider: Brett [name] Other Clinician: Referring Provider: Treating Provider/Extender: [name] Weeks in Treatment: 8 Verbal / Phone Orders: No Diagnosis Coding ICD-10 Coding Code Description [phone] Non-pressure chronic ulcer of other part of right lower leg with fat layer exposed I87.2 Venous insufficiency (chronic) (peripheral) J44.89 Other specified chronic obstructive pulmonary disease I73.9 Peripheral vascular disease, unspecified Follow-up Appointments ppointment in 1 week. - Dr. Lady [name] - Room 3 Return A Anesthetic (In clinic) Topical Lidocaine 5% applied to wound bed Bathing/ Shower/ Hygiene Other Bathing/Shower/Hygiene Orders/Instructions: - May take a sponge bath  Edema Control - Lymphedema / SCD / Other Elevate legs to the level of the heart or above for 30 minutes daily and/or when sitting for 3-4 times a day throughout the day. Avoid standing for long periods of time. Exercise regularly Moisturize legs daily. - Use lotion on left leg-STOP using the steroid cream Wound Treatment Wound #1 - Lower Leg Wound Laterality: Right, Anterior Cleanser: Soap and Water 1 x Per Week/30 Days [name], [name] Bamberg ([id number]) [id number] Page 4 of 9 Discharge Instructions: May shower and wash wound with dial antibacterial soap and water prior to dressing change. Cleanser: Wound Cleanser 1 x Per Week/30 Days Discharge Instructions: Cleanse the wound with wound cleanser prior to applying a clean dressing using gauze sponges, not tissue or cotton balls. Peri-Wound Care: Sween Lotion (Moisturizing lotion) 1 x Per Week/30 Days Discharge Instructions: Apply moisturizing lotion as directed Topical: Mupirocin Ointment 1 x Per Week/30 Days Discharge Instructions: Apply Mupirocin (Bactroban) as instructed Prim Dressing: Sorbalgon AG Dressing, 4x4 (in/in) 1 x Per Week/30 Days ary Discharge Instructions: Apply to wound bed as instructed Secondary Dressing: Woven Gauze Sponge, Non-Sterile 4x4 in 1 x Per Week/30 Days Discharge Instructions: Apply over primary  dressing as directed. Secondary Dressing: Zetuvit Plus 4x8 in 1 x Per Week/30 Days Discharge Instructions: Apply over primary dressing as directed. Compression Wrap: ThreePress (3 layer compression wrap) 1 x Per Week/30 Days Discharge Instructions: Apply three layer compression as directed. Electronic Signature(s) Signed: 12/25/2022 12:21:08 PM By: Fredirick Maudlin MD FACS Entered By: Fredirick Maudlin on 12/25/2022 12:19:58 -------------------------------------------------------------------------------- Problem List Details Patient Name: Date of Service: [name] 12/25/2022 11:00 A M Medical Record Number: [id number] Patient Account Number: [id number] Date of Birth/Sex: Treating RN: [birth year] (82 y.o. F) Primary Care Provider: Terrill Mohr Other Clinician: Referring Provider: Treating Provider/Extender: Serita Kyle Weeks in Treatment: 8 Active Problems ICD-10 Encounter Code Description Active Date MDM Diagnosis [phone] Non-pressure chronic ulcer of other part of right lower leg with fat layer 10/30/2022 No Yes exposed I87.2 Venous insufficiency (chronic) (peripheral) 10/30/2022 No Yes J44.89 Other specified chronic obstructive pulmonary disease 10/30/2022 No Yes I73.9 Peripheral vascular disease, unspecified 10/30/2022 No Yes Inactive Problems Resolved Problems Electronic Signature(s) Beck, [name] Bamberg ([id number]) [phone].pdf Page 5 of 9 Signed: 12/25/2022 12:12:02 PM By: Fredirick Maudlin MD FACS Entered By: Fredirick Maudlin on 12/25/2022 12:12:02 -------------------------------------------------------------------------------- Progress Note Details Patient Name: Date of Service: [name] 12/25/2022 11:00 A M Medical Record Number: [id number] Patient Account Number: [id number] Date of Birth/Sex: Treating RN: 01-Jan-1941 (82 y.o. 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 The patient and her family elected for comfort care however somehow she managed to heal her wounds. From the electronic medical record, it looks like she was receiving home health services and being followed by her primary care provider. At the beginning of November, she saw her PCP again and had new ulcers on her right leg. Home health services were not available to assist the patient and she was referred to the wound care center for further evaluation  and management. On the right anterior lower leg, she has multiple ulcers of varying degrees and depths. Some are limited to breakdown of skin and others are deeper, into the fat layer. There is slough and some eschar accumulation. She has periwound erythema and 1-2+ edema. 12/14-second visit for this woman who arrived to clinic accompanied by a neighbor. 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 She tells me that in 2021 she had bilateral lower leg wounds which took an ordinarily long period of time to healo 2 years. The current wounds have been on the right leg for about 2 months. She may or may not have been wearing a support stocking on the right leg. She has not been using anything on the left but fortunately does not have any wounds there. She makes it clear to me that she cannot get standard over the toe stockings on herself. Her husband passed away sometime earlier this year I believe 11/13/2022: All of  the wounds seem to have progressed, getting larger and at this point and the fat layer is exposed in all locations. There is slough accumulation on all surfaces. 11/20/2022: Her wounds continue to worsen. They have expanded and there is thick slough on all of the wound surfaces. She is experiencing more pain. 11/28/2022: The cultures that I took last week grew out methicillin-resistant Staph aureus so we discontinued Augmentin and initiated doxycycline. She is currently taking this. Her wounds are less painful today. They are all about the same size, but they have not expanded. There is rubbery slough on the wound surfaces. 12/04/2022: The wounds look improved today after being on doxycycline and using topical mupirocin. There is still a fair amount of slough accumulation. 12/11/2022: The wounds are smaller again this week. They continue to accumulate some slough and remain tender. She has completed the oral doxycycline. 12/18/2022: The wounds continue to contract. A couple of the smaller ones have scabbed over. There is slough and eschar accumulation. They are less tender than on prior occasions. 12/25/2022: The wounds are all much smaller. A number of them have healed under some eschar. There is still some slough accumulation and perimeter eschar present. She states she is no longer having any pain. Patient History Information obtained from Patient. Social History Never smoker, Marital Status - Widowed, Alcohol Use - Never, Drug Use - No History, Caffeine Use - Daily. Medical History Respiratory Patient has history of Asthma Cardiovascular Patient has history of Hypertension Musculoskeletal Patient has history of Osteoarthritis Hospitalization/Surgery History - Bilateral Cataract Extraction; Bone Graft-jaw; Arm Surgery (d/t dog bite). Medical A Surgical History Notes nd Eyes Hx: Cataracts Ear/Nose/Mouth/Throat Hx: wearing Bilateral hearing aids Emig, [name] Ee ([id number])  [phone].pdf Page 6 of 9 Genitourinary Chronic Renal Impairment Stage 3 Psychiatric Hx: Depression Objective Constitutional no acute distress. Vitals Time Taken: 11:10 AM, Height: 64 in, Weight: 180 lbs, BMI: 30.9, Temperature: 97.6 F, Pulse: 87 bpm, Respiratory Rate: 18 breaths/min, Blood Pressure: 138/78 mmHg. Respiratory Normal work of breathing on room air. General Notes: 12/25/2022: The wounds are all much smaller. A number of them have healed under some eschar. There is still some slough accumulation and perimeter eschar present. She states she is no longer having any pain. Integumentary (Hair, Skin) Wound #1 status is Open. Original cause of wound was Gradually Appeared. The date acquired was: 09/30/2022. The wound has been in treatment 8 weeks. The wound is located on the Right,Anterior Lower Leg. The wound measures 7.1cm length x 1cm width x  0.1cm depth; 5.576cm^2 area and 0.558cm^3 volume. There is Fat Layer (Subcutaneous Tissue) exposed. There is no tunneling or undermining noted. There is a large amount of serosanguineous drainage noted. The wound margin is distinct with the outline attached to the wound base. There is medium (34-66%) red, hyper - granulation within the wound bed. There is a medium (34-66%) amount of necrotic tissue within the wound bed including Eschar and Adherent Slough. The periwound skin appearance exhibited: Scarring, Hemosiderin Staining. The periwound skin appearance did not exhibit: Excoriation, Dry/Scaly, Maceration. Periwound temperature was noted as No Abnormality. Assessment Active Problems ICD-10 Non-pressure chronic ulcer of other part of right lower leg with fat layer exposed Venous insufficiency (chronic) (peripheral) Other specified chronic obstructive pulmonary disease Peripheral vascular disease, unspecified Procedures Wound #1 Pre-procedure diagnosis of Wound #1 is a Venous Leg Ulcer located on the  Right,Anterior Lower Leg .Severity of Tissue Pre Debridement is: Fat layer exposed. There was a Selective/Open Wound Non-Viable Tissue Debridement with a total area of 7.1 sq cm performed by Fredirick Maudlin, MD. With the following instrument(s): Curette to remove Non-Viable tissue/material. Material removed includes Eschar and Slough and after achieving pain control using Lidocaine 4% T opical Solution. No specimens were taken. A time out was conducted at 11:28, prior to the start of the procedure. A Minimum amount of bleeding was controlled with Pressure. The procedure was tolerated well with a pain level of 0 throughout and a pain level of 0 following the procedure. Post Debridement Measurements: 7.1cm length x 1cm width x 0.1cm depth; 0.558cm^3 volume. Character of Wound/Ulcer Post Debridement is improved. Severity of Tissue Post Debridement is: Fat layer exposed. Post procedure Diagnosis Wound #1: Same as Pre-Procedure General Notes: Scribed for Dr. Celine Ahr by YvonneDawson. Pre-procedure diagnosis of Wound #1 is a Venous Leg Ulcer located on the Right,Anterior Lower Leg . There was a Three Layer Compression Therapy Procedure by Dellie Catholic, RN. Post procedure Diagnosis Wound #1: Same as Pre-Procedure Plan Follow-up Appointments: Return Appointment in 1 week. - Dr. Celine Ahr - Room 3 Anesthetic: (In clinic) Topical Lidocaine 5% applied to wound bed Bathing/ Shower/ Hygiene: Other Bathing/Shower/Hygiene Orders/Instructions: - May take a sponge bath [name], [name] ([id number]) [phone].pdf Page 7 of 9 Edema Control - Lymphedema / SCD / Other: Elevate legs to the level of the heart or above for 30 minutes daily and/or when sitting for 3-4 times a day throughout the day. Avoid standing for long periods of time. Exercise regularly Moisturize legs daily. - Use lotion on left leg-STOP using the steroid cream WOUND #1: - Lower Leg Wound Laterality: Right,  Anterior Cleanser: Soap and Water 1 x Per Week/30 Days Discharge Instructions: May shower and wash wound with dial antibacterial soap and water prior to dressing change. Cleanser: Wound Cleanser 1 x Per Week/30 Days Discharge Instructions: Cleanse the wound with wound cleanser prior to applying a clean dressing using gauze sponges, not tissue or cotton balls. Peri-Wound Care: Sween Lotion (Moisturizing lotion) 1 x Per Week/30 Days Discharge Instructions: Apply moisturizing lotion as directed Topical: Mupirocin Ointment 1 x Per Week/30 Days Discharge Instructions: Apply Mupirocin (Bactroban) as instructed Prim Dressing: Sorbalgon AG Dressing, 4x4 (in/in) 1 x Per Week/30 Days ary Discharge Instructions: Apply to wound bed as instructed Secondary Dressing: Woven Gauze Sponge, Non-Sterile 4x4 in 1 x Per Week/30 Days Discharge Instructions: Apply over primary dressing as directed. Secondary Dressing: Zetuvit Plus 4x8 in 1 x Per Week/30 Days Discharge Instructions: Apply over primary dressing as directed. Com pression Wrap: ThreePress (3  layer compression wrap) 1 x Per Week/30 Days Discharge Instructions: Apply three layer compression as directed. 12/25/2022: The wounds are all much smaller. A number of them have healed under some eschar. There is still some slough accumulation and perimeter eschar present. She states she is no longer having any pain. I used a curette to debride slough and eschar from all of her wounds. We will continue topical mupirocin with silver alginate. Continue 3 layer compression. I anticipate that she will likely be healed in the next 2 to 3 weeks. I will see her in 1 week's time. Electronic Signature(s) Signed: 12/25/2022 12:20:30 PM By: Fredirick Maudlin MD FACS Entered By: Fredirick Maudlin on 12/25/2022 12:20:30 -------------------------------------------------------------------------------- HxROS Details Patient Name: Date of Service: [name] 12/25/2022 11:00  A M Medical Record Number: [id number] Patient Account Number: [id number] Date of Birth/Sex: Treating RN: 19-Dec-1940 (82 y.o. F) Primary Care Provider: Terrill Mohr Other Clinician: Referring Provider: Treating Provider/Extender: Renaldo Harrison in Treatment: 8 Information Obtained From Patient Eyes Medical History: Past Medical History Notes: Hx: Cataracts Ear/Nose/Mouth/Throat Medical History: Past Medical History Notes: Hx: wearing Bilateral hearing aids Respiratory Medical History: Positive for: Asthma Cardiovascular Medical History: Positive for: Hypertension Genitourinary Mancuso, [name] Bamberg ([id number]) [id number] Page 8 of 9 Medical History: Past Medical History Notes: Chronic Renal Impairment Stage 3 Musculoskeletal Medical History: Positive for: Osteoarthritis Psychiatric Medical History: Past Medical History Notes: Hx: Depression Immunizations Pneumococcal Vaccine: Received Pneumococcal Vaccination: Yes Received Pneumococcal Vaccination On or After 60th Birthday: Yes Implantable Devices None Hospitalization / Surgery History Type of Hospitalization/Surgery Bilateral Cataract Extraction; Bone Graft-jaw; Arm Surgery (d/t dog bite) Family and Social History Never smoker; Marital Status - Widowed; Alcohol Use: Never; Drug Use: No History; Caffeine Use: Daily; Financial Concerns: No; Food, Clothing or Shelter Needs: No; Support System Lacking: No; Transportation Concerns: No Electronic Signature(s) Signed: 12/25/2022 12:21:08 PM By: Fredirick Maudlin MD FACS Entered By: Fredirick Maudlin on 12/25/2022 12:17:24 -------------------------------------------------------------------------------- SuperBill Details Patient Name: Date of Service: [name] 12/25/2022 Medical Record Number: [id number] Patient Account Number: [id number] Date of Birth/Sex: Treating RN: 23-Dec-1940 (81 y.o. F) Primary Care Provider:  Terrill Mohr Other Clinician: Referring Provider: Treating Provider/Extender: Serita Kyle Weeks in Treatment: 8 Diagnosis Coding ICD-10 Codes Code Description [phone] Non-pressure chronic ulcer of other part of right lower leg with fat layer exposed I87.2 Venous insufficiency (chronic) (peripheral) J44.89 Other specified chronic obstructive pulmonary disease I73.9 Peripheral vascular disease, unspecified Facility Procedures : CPT4 Code: [id number] Description: 75170 - DEBRIDE WOUND 1ST 20 SQ CM OR < ICD-10 Diagnosis Description L97.812 Non-pressure chronic ulcer of other part of right lower leg with fat layer expos Modifier: ed Quantity: 1 Physician Procedures : CPT4 Code Description Modifier [id number] - WC PHYS LEVEL 4 - EST PT 25 [name], [name] ([id number]) [id number] ICD-10 Diagnosis Description L45.625 Non-pressure chronic ulcer of other part of right lower leg with fat layer  exposed I87.2 Venous insufficiency (chronic) (peripheral) I73.9 Peripheral vascular disease, unspecified J44.89 Other specified chronic obstructive pulmonary disease Quantity: 1 227.pdf Page 9 of 9 : [id number] - WC PHYS DEBR WO ANESTH 20 SQ CM 1 ICD-10 Diagnosis Description 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

## 2023-01-01 ENCOUNTER — Encounter (HOSPITAL_BASED_OUTPATIENT_CLINIC_OR_DEPARTMENT_OTHER): Payer: Medicare Other | Admitting: General Surgery

## 2023-01-01 DIAGNOSIS — L97812 Non-pressure chronic ulcer of other part of right lower leg with fat layer exposed: Secondary | ICD-10-CM | POA: Diagnosis not present

## 2023-01-02 NOTE — Progress Notes (Signed)
[name] [name] ([id number] Page 1 of 9 Visit Report for 01/01/2023 Chief Complaint Document Details Patient Name: Date of Service: PA DDISO [name] [name] [facility] 01/01/2023 11:00 A M Medical Record Number: [id number] Patient Account Number: [id number] Date of Birth/Sex: Treating RN: [birth year] (81 y.o. F) Primary Care Provider: Terrill Mohr Other Clinician: Referring Provider: Treating Provider/Extender: Renaldo Harrison in Treatment: 9 Information Obtained from: Patient Chief Complaint Patient presents for treatment of open ulcers due to venous insufficiency Electronic Signature(s) Signed: 01/01/2023 11:53:27 AM By: Fredirick Maudlin MD FACS Entered By: Fredirick Maudlin on 01/01/2023 11:53:27 -------------------------------------------------------------------------------- Debridement Details Patient Name: Date of Service: PA DDISO [name] [name] 01/01/2023 11:00 A M Medical Record Number: [id number] Patient Account Number: [id number] Date of Birth/Sex: Treating RN: 01-29-1941 (82 y.o. [name] Primary Care Provider: Terrill Mohr Other Clinician: Referring Provider: Treating Provider/Extender: [name] [name] in Treatment: 9 Debridement Performed for Assessment: Wound #1 Right,Anterior Lower Leg Performed By: Physician Fredirick Maudlin, MD Debridement Type: Debridement Severity of Tissue Pre Debridement: Fat layer exposed Level of Consciousness (Pre-procedure): Awake and Alert Pre-procedure Verification/Time Out Yes - 11:35 Taken: Start Time: 11:35 Pain Control: Lidocaine 4% T opical Solution T Area Debrided (L x W): otal 6.9 (cm) x 0.5 (cm) = 3.45 (cm) Tissue and other material debrided: Non-Viable, Slough, Slough Level: Non-Viable Tissue Debridement Description: Selective/Open Wound Instrument: Curette Bleeding: Minimum Hemostasis Achieved: Pressure End Time: 11:37 Procedural Pain: 0 Post  Procedural Pain: 0 Response to Treatment: Procedure was tolerated well Level of Consciousness (Post- Awake and Alert procedure): Post Debridement Measurements of Total Wound Length: (cm) 6.9 Width: (cm) 0.5 Depth: (cm) 0.1 Volume: (cm) 0.271 Character of Wound/Ulcer Post Debridement: Improved Severity of Tissue Post Debridement: Fat layer exposed [name] [name] ([id number] Page 2 of 9 Post Procedure Diagnosis Same as Pre-procedure Notes Scribed for Dr. Celine Ahr by J.Scotton Electronic Signature(s) Signed: 01/01/2023 12:13:38 PM By: Fredirick Maudlin MD FACS Signed: 01/01/2023 4:57:03 PM By: Dellie Catholic RN Entered By: Dellie Catholic on 01/01/2023 11:42:40 -------------------------------------------------------------------------------- HPI Details Patient Name: Date of Service: PA DDISO [name] [name] 01/01/2023 11:00 A M Medical Record Number: [id number] Patient Account Number: [id number] Date of Birth/Sex: Treating RN: 22-Aug-1941 (82 y.o. 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 The patient and her family elected for comfort care however somehow she managed to heal her wounds. From the electronic medical record, it looks like she was receiving home health services and being followed by her primary care provider. At the beginning of November, she saw her PCP again and had new ulcers on her right leg. Home health services were not available to assist the patient and she was referred to the wound care center for  further evaluation and management. On the right anterior lower leg, she has multiple ulcers of varying degrees and depths. Some are limited to breakdown of skin and others are deeper, into the fat layer. There is slough and some eschar accumulation. She has periwound erythema and 1-2+ edema. 12/14-second visit for this woman who arrived to clinic accompanied by a neighbor. 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 She  tells me that in 2021 she had bilateral lower leg wounds which took an ordinarily long period of time to healo 2 years. The current wounds have been on the right leg for about 2 months. She may or may not have been wearing a support stocking on the right leg. She has not been using anything on the left but fortunately does not have any wounds there. She makes it clear to me that she cannot get standard over the toe stockings on herself. Her husband passed away sometime earlier this year I believe 11/13/2022: All of the wounds seem to have progressed, getting larger and at this point and the fat layer is exposed in all locations. There is slough accumulation on all surfaces. 11/20/2022: Her wounds continue to worsen. They have expanded and there is thick slough on all of the wound surfaces. She is experiencing more pain. 11/28/2022: The cultures that I took last week grew out methicillin-resistant Staph aureus so we discontinued Augmentin and initiated doxycycline. She is currently taking this. Her wounds are less painful today. They are all about the same size, but they have not expanded. There is rubbery slough on the wound surfaces. 12/04/2022: The wounds look improved today after being on doxycycline and using topical mupirocin. There is still a fair amount of slough accumulation. 12/11/2022: The wounds are smaller again this week. They continue to accumulate some slough and remain tender. She has completed the oral doxycycline. 12/18/2022: The wounds continue to  contract. A couple of the smaller ones have scabbed over. There is slough and eschar accumulation. They are less tender than on prior occasions. 12/25/2022: The wounds are all much smaller. A number of them have healed under some eschar. There is still some slough accumulation and perimeter eschar present. She states she is no longer having any pain. 01/01/2023: She is down to just 3 remaining wounds. They are all smaller with just a little bit of slough and eschar. She is concerned about the redness and inflammation in her left leg. Electronic Signature(s) Signed: 01/01/2023 11:54:16 AM By: Fredirick Maudlin MD FACS Entered By: Fredirick Maudlin on 01/01/2023 11:54:15 [name] [name] Bamberg ([id number] Page 3 of 9 -------------------------------------------------------------------------------- Physical Exam Details Patient Name: Date of Service: PA DDISO [name] [name] 01/01/2023 11:00 A M Medical Record Number: [id number] Patient Account Number: [id number] Date of Birth/Sex: Treating RN: 03/25/1941 (82 y.o. F) Primary Care Provider: Terrill Mohr Other Clinician: Referring Provider: Treating Provider/Extender: [name] [name] in Treatment: 9 Constitutional Hypertensive, asymptomatic. . . . no acute distress. Respiratory Normal work of breathing on room air. Notes 01/01/2023: She is down to just 3 remaining wounds. They are all smaller with just a little bit of slough and eschar. She is concerned about the redness and inflammation in her left leg. Electronic Signature(s) Signed: 01/01/2023 11:54:46 AM By: Fredirick Maudlin MD FACS Entered By: Fredirick Maudlin on 01/01/2023 11:54:45 -------------------------------------------------------------------------------- Physician Orders Details Patient Name: Date of Service: PA DDISO [name] [name] 01/01/2023 11:00 A M Medical Record Number: [id number] Patient Account Number: [id number] Date of Birth/Sex:  Treating RN: 01-12-1941 (82 y.o. [name] Primary Care Provider: Terrill Mohr Other Clinician: Referring Provider: Treating Provider/Extender: Renaldo Harrison in Treatment: 9 Verbal / Phone Orders: No Diagnosis Coding ICD-10 Coding Code Description [phone] Non-pressure chronic ulcer of other part of right lower leg with fat layer exposed I87.2 Venous insufficiency (chronic) (peripheral) J44.89 Other specified chronic obstructive pulmonary disease I73.9 Peripheral vascular disease, unspecified Follow-up Appointments ppointment  in 1 week. - Dr. Celine Ahr - Room 3 Return A Other: - Remember to pick up Triamcinolone Cream at the Pharmacy Anesthetic (In clinic) Topical Lidocaine 5% applied to wound bed Bathing/ Shower/ Hygiene Other Bathing/Shower/Hygiene Orders/Instructions: - May take a sponge bath Edema Control - Lymphedema / SCD / Other Elevate legs to the level of the heart or above for 30 minutes daily and/or when sitting for 3-4 times a day throughout the day. Avoid standing for long periods of time. Exercise regularly Moisturize legs daily. - Use lotion on left leg-STOP using the steroid cream Non Wound Condition Left Lower Extremity Gilchrest, [name] Bamberg ([id number] Page 4 of 9 Other Non Wound Condition Orders/Instructions: - Place Triamcinolone Cream on lower left leg once a day as needed. Wound Treatment Wound #1 - Lower Leg Wound Laterality: Right, Anterior Cleanser: Soap and Water 1 x Per Week/30 Days Discharge Instructions: May shower and wash wound with dial antibacterial soap and water prior to dressing change. Cleanser: Wound Cleanser 1 x Per Week/30 Days Discharge Instructions: Cleanse the wound with wound cleanser prior to applying a clean dressing using gauze sponges, not tissue or cotton balls. Peri-Wound Care: Sween Lotion (Moisturizing lotion) 1 x Per Week/30 Days Discharge Instructions: Apply  moisturizing lotion as directed Topical: Mupirocin Ointment 1 x Per Week/30 Days Discharge Instructions: Apply Mupirocin (Bactroban) as instructed Prim Dressing: Sorbalgon AG Dressing, 4x4 (in/in) 1 x Per Week/30 Days ary Discharge Instructions: Apply to wound bed as instructed Secondary Dressing: Woven Gauze Sponge, Non-Sterile 4x4 in 1 x Per Week/30 Days Discharge Instructions: Apply over primary dressing as directed. Secondary Dressing: Zetuvit Plus 4x8 in 1 x Per Week/30 Days Discharge Instructions: Apply over primary dressing as directed. Compression Wrap: ThreePress (3 layer compression wrap) 1 x Per Week/30 Days Discharge Instructions: Apply three layer compression as directed. Patient Medications llergies: meloxicam A Notifications Medication Indication Start End 01/01/2023 triamcinolone acetonide DOSE topical 0.1 % lotion - Apply to affected skin once daily Electronic Signature(s) Signed: 01/01/2023 12:13:38 PM By: Fredirick Maudlin MD FACS Previous Signature: 01/01/2023 11:59:26 AM Version By: Fredirick Maudlin MD FACS Entered By: Fredirick Maudlin on 01/01/2023 12:00:51 -------------------------------------------------------------------------------- Problem List Details Patient Name: Date of Service: PA DDISO [name] [name] 01/01/2023 11:00 A M Medical Record Number: [id number] Patient Account Number: [id number] Date of Birth/Sex: Treating RN: 1941/07/13 (82 y.o. F) Primary Care Provider: Terrill Mohr Other Clinician: Referring Provider: Treating Provider/Extender: [name] [name] in Treatment: 9 Active Problems ICD-10 Encounter Code Description Active Date MDM Diagnosis [phone] Non-pressure chronic ulcer of other part of right lower leg with fat layer 10/30/2022 No Yes exposed I87.2 Venous insufficiency (chronic) (peripheral) 10/30/2022 No Yes J44.89 Other specified chronic obstructive pulmonary disease 10/30/2022 No Yes [name] [name] ([id number] Page 5 of 9 I73.9 Peripheral vascular disease, unspecified 10/30/2022 No Yes Inactive Problems Resolved Problems Electronic Signature(s) Signed: 01/01/2023 11:50:27 AM By: Fredirick Maudlin MD FACS Entered By: Fredirick Maudlin on 01/01/2023 11:50:27 -------------------------------------------------------------------------------- Progress Note Details Patient Name: Date of Service: PA DDISO [name] [name] 01/01/2023 11:00 A M Medical Record Number: [id number] Patient Account Number: [id number] Date of Birth/Sex: Treating RN: [birth year] (82 y.o. F) Primary Care Provider: Terrill Mohr Other Clinician: Referring Provider: Treating Provider/Extender: [name] [name] in Treatment: 9 Subjective Chief Complaint Information obtained from Patient Patient presents for treatment of open ulcers due to venous insufficiency History of Present Illness (HPI) ADMISSION 10/30/2022 This is an 82 year old woman with a past medical history significant for repeated episodes of  stasis-related ulceration of her bilateral lower extremities. 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 The patient and her family elected for comfort care however somehow she managed to heal her wounds. From the electronic medical record, it looks like she was receiving home health services and being followed by her primary care provider. At the beginning of November, she saw her PCP again and had new ulcers on her right leg. Home health services were not available to assist the patient and she was referred to the wound care center for further evaluation and management. On the right anterior lower leg, she has multiple ulcers of varying degrees and depths. Some are limited to breakdown of skin and others are deeper, into the fat layer. There is slough and some eschar accumulation. She has periwound erythema and 1-2+  edema. 12/14-second visit for this woman who arrived to clinic accompanied by a neighbor. 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 She tells me that in 2021 she had bilateral lower leg wounds which took an ordinarily long period of time to healo 2 years. The current wounds have been on the right leg for about 2 months. She may or may not have been wearing a support stocking on the right leg. She has not been using anything on the left but fortunately does not have any wounds there. She makes it clear to me that she cannot get standard over the toe stockings on herself. Her husband passed away sometime earlier this year I believe 11/13/2022: All of the wounds seem to have progressed, getting larger and at this point and the fat layer is exposed in all locations. There is slough accumulation on all surfaces. 11/20/2022: Her wounds continue to worsen. They have expanded and there is thick slough on all of the wound surfaces. She is experiencing more pain. 11/28/2022: The cultures that I took last week grew out methicillin-resistant Staph aureus so we discontinued Augmentin and initiated doxycycline. She is currently taking this. Her wounds are less painful today. They are all about the same size, but they have not expanded. There is rubbery slough on the wound surfaces. 12/04/2022: The wounds look improved today after being on doxycycline and using topical mupirocin. There is still a fair amount of slough accumulation. 12/11/2022: The wounds are smaller again this week. They continue to accumulate some slough and remain tender. She has completed the oral doxycycline. 12/18/2022: The wounds continue to contract. A couple of the smaller ones have scabbed over. There is slough and eschar accumulation. They are less tender than on prior occasions. 12/25/2022: The wounds are all much smaller. A number of them have healed under some eschar. There is still some slough accumulation and  perimeter eschar present. She states she is no longer having any pain. 01/01/2023: She is down to just 3 remaining wounds. They are all smaller with just a little bit of slough and eschar. She is concerned about the redness and inflammation in her left leg. Patient History ELLASANDRA, ASCH ([id number]) [id number] Page 6 of 9 Information obtained from Patient. Social History Never smoker, Marital Status - Widowed, Alcohol Use - Never, Drug Use - No History, Caffeine Use - Daily. Medical History Respiratory Patient has history of Asthma Cardiovascular Patient has history of Hypertension Musculoskeletal Patient has history of Osteoarthritis Hospitalization/Surgery History - Bilateral Cataract Extraction; Bone Graft-jaw; Arm Surgery (d/t dog bite). Medical A Surgical History Notes nd Eyes Hx: Cataracts  Ear/Nose/Mouth/Throat Hx: wearing Bilateral hearing aids Genitourinary Chronic Renal Impairment Stage 3 Psychiatric Hx: Depression Objective Constitutional Hypertensive, asymptomatic. no acute distress. Vitals Time Taken: 11:16 AM, Height: 64 in, Weight: 180 lbs, BMI: 30.9, Temperature: 98.4 F, Pulse: 85 bpm, Respiratory Rate: 18 breaths/min, Blood Pressure: 150/86 mmHg. Respiratory Normal work of breathing on room air. General Notes: 01/01/2023: She is down to just 3 remaining wounds. They are all smaller with just a little bit of slough and eschar. She is concerned about the redness and inflammation in her left leg. Integumentary (Hair, Skin) Wound #1 status is Open. Original cause of wound was Gradually Appeared. The date acquired was: 09/30/2022. The wound has been in treatment 9 [name]. The wound is located on the Right,Anterior Lower Leg. The wound measures 6.9cm length x 0.5cm width x 0.1cm depth; 2.71cm^2 area and 0.271cm^3 volume. There is Fat Layer (Subcutaneous Tissue) exposed. There is no tunneling or undermining noted. There is a large amount of  serosanguineous drainage noted. The wound margin is distinct with the outline attached to the wound base. There is medium (34-66%) red, hyper - granulation within the wound bed. There is a medium (34-66%) amount of necrotic tissue within the wound bed including Eschar and Adherent Slough. The periwound skin appearance exhibited: Scarring, Hemosiderin Staining. The periwound skin appearance did not exhibit: Excoriation, Dry/Scaly, Maceration. Periwound temperature was noted as No Abnormality. Assessment Active Problems ICD-10 Non-pressure chronic ulcer of other part of right lower leg with fat layer exposed Venous insufficiency (chronic) (peripheral) Other specified chronic obstructive pulmonary disease Peripheral vascular disease, unspecified Procedures Wound #1 Pre-procedure diagnosis of Wound #1 is a Venous Leg Ulcer located on the Right,Anterior Lower Leg .Severity of Tissue Pre Debridement is: Fat layer exposed. There was a Selective/Open Wound Non-Viable Tissue Debridement with a total area of 3.45 sq cm performed by Fredirick Maudlin, MD. With the following instrument(s): Curette to remove Non-Viable tissue/material. Material removed includes [facility] after achieving pain control using Lidocaine 4% Topical Solution. No specimens were taken. A time out was conducted at 11:35, prior to the start of the procedure. A Minimum amount of bleeding was controlled with Pressure. The procedure was tolerated well with a pain level of 0 throughout and a pain level of 0 following the procedure. Post Debridement Measurements: 6.9cm length x 0.5cm width x 0.1cm depth; 0.271cm^3 volume. Character of Wound/Ulcer Post Debridement is improved. Severity of Tissue Post Debridement is: Fat layer exposed. Post procedure Diagnosis Wound #1: Same as Pre-Procedure [name] [name] Bamberg ([id number] Page 7 of 9 General Notes: Scribed for Dr. Celine Ahr by J.Scotton. Pre-procedure  diagnosis of Wound #1 is a Venous Leg Ulcer located on the Right,Anterior Lower Leg . There was a Three Layer Compression Therapy Procedure by Dellie Catholic, RN. Post procedure Diagnosis Wound #1: Same as Pre-Procedure Plan Follow-up Appointments: Return Appointment in 1 week. - Dr. Celine Ahr - Room 3 Other: - Remember to pick up Triamcinolone Cream at the Pharmacy Anesthetic: (In clinic) Topical Lidocaine 5% applied to wound bed Bathing/ Shower/ Hygiene: Other Bathing/Shower/Hygiene Orders/Instructions: - May take a sponge bath Edema Control - Lymphedema / SCD / Other: Elevate legs to the level of the heart or above for 30 minutes daily and/or when sitting for 3-4 times a day throughout the day. Avoid standing for long periods of time. Exercise regularly Moisturize legs daily. - Use lotion on left leg-STOP using the steroid cream Non Wound Condition: Other Non Wound Condition Orders/Instructions: - Place Triamcinolone Cream on lower left leg once a  day as needed. The following medication(s) was prescribed: triamcinolone acetonide topical 0.1 % lotion Apply to affected skin once daily starting 01/01/2023 WOUND #1: - Lower Leg Wound Laterality: Right, Anterior Cleanser: Soap and Water 1 x Per Week/30 Days Discharge Instructions: May shower and wash wound with dial antibacterial soap and water prior to dressing change. Cleanser: Wound Cleanser 1 x Per Week/30 Days Discharge Instructions: Cleanse the wound with wound cleanser prior to applying a clean dressing using gauze sponges, not tissue or cotton balls. Peri-Wound Care: Sween Lotion (Moisturizing lotion) 1 x Per Week/30 Days Discharge Instructions: Apply moisturizing lotion as directed Topical: Mupirocin Ointment 1 x Per Week/30 Days Discharge Instructions: Apply Mupirocin (Bactroban) as instructed Prim Dressing: Sorbalgon AG Dressing, 4x4 (in/in) 1 x Per Week/30 Days ary Discharge Instructions: Apply to wound bed as  instructed Secondary Dressing: Woven Gauze Sponge, Non-Sterile 4x4 in 1 x Per Week/30 Days Discharge Instructions: Apply over primary dressing as directed. Secondary Dressing: Zetuvit Plus 4x8 in 1 x Per Week/30 Days Discharge Instructions: Apply over primary dressing as directed. Com pression Wrap: ThreePress (3 layer compression wrap) 1 x Per Week/30 Days Discharge Instructions: Apply three layer compression as directed. 01/01/2023: She is down to just 3 remaining wounds. They are all smaller with just a little bit of slough and eschar. She is concerned about the redness and inflammation in her left leg. I used a curette to debride eschar and slough from each of her remaining wounds. We will continue topical mupirocin with silver alginate and 3 layer compression. I also prescribed triamcinolone lotion to see if this helps with the inflammation and irritation of the skin on her left leg. She will follow-up in 1 week. Electronic Signature(s) Signed: 01/01/2023 12:01:30 PM By: Fredirick Maudlin MD FACS Entered By: Fredirick Maudlin on 01/01/2023 12:01:30 -------------------------------------------------------------------------------- HxROS Details Patient Name: Date of Service: PA DDISO [name] [name] 01/01/2023 11:00 A M Medical Record Number: [id number] Patient Account Number: [id number] Date of Birth/Sex: Treating RN: Mar 12, 1941 (82 y.o. F) Primary Care Provider: Terrill Mohr Other Clinician: Referring Provider: Treating Provider/Extender: [name] [name] in Treatment: 9 Information Obtained From Patient [name] [name] Bamberg ([id number]) [id number] Page 8 of 9 Eyes Medical History: Past Medical History Notes: Hx: Cataracts Ear/Nose/Mouth/Throat Medical History: Past Medical History Notes: Hx: wearing Bilateral hearing aids Respiratory Medical History: Positive for: Asthma Cardiovascular Medical History: Positive for:  Hypertension Genitourinary Medical History: Past Medical History Notes: Chronic Renal Impairment Stage 3 Musculoskeletal Medical History: Positive for: Osteoarthritis Psychiatric Medical History: Past Medical History Notes: Hx: Depression Immunizations Pneumococcal Vaccine: Received Pneumococcal Vaccination: Yes Received Pneumococcal Vaccination On or After 60th Birthday: Yes Implantable Devices None Hospitalization / Surgery History Type of Hospitalization/Surgery Bilateral Cataract Extraction; Bone Graft-jaw; Arm Surgery (d/t dog bite) Family and Social History Never smoker; Marital Status - Widowed; Alcohol Use: Never; Drug Use: No History; Caffeine Use: Daily; Financial Concerns: No; Food, Clothing or Shelter Needs: No; Support System Lacking: No; Transportation Concerns: No Engineer, maintenance) Signed: 01/01/2023 12:13:38 PM By: Fredirick Maudlin MD FACS Entered By: Fredirick Maudlin on 01/01/2023 11:54:22 -------------------------------------------------------------------------------- SuperBill Details Patient Name: Date of Service: PA DDISO [name] [name] 01/01/2023 Medical Record Number: [id number] Patient Account Number: [id number] Date of Birth/Sex: Treating RN: 09-07-1941 (81 y.o. 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 I73.9 Peripheral vascular disease, unspecified Facility Procedures : CPT4 Code: [id number] Description: [id number] - DEBRIDE WOUND 1ST 20 SQ CM OR < ICD-10 Diagnosis Description [id number] Non-pressure chronic ulcer  of other part of right lower leg with fat layer expos Modifier: ed Quantity: 1 Physician Procedures : CPT4 Code Description Modifier [id number] - WC PHYS LEVEL 4 - EST PT 25 ICD-10 Diagnosis Description L97.812 Non-pressure chronic ulcer of other part of right lower leg with fat layer exposed I87.2 Venous insufficiency (chronic) (peripheral) I73.9  Peripheral vascular disease, unspecified 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

## 2023-01-02 NOTE — Progress Notes (Signed)
[name] ([id number].pdf Page 1 of 7 Visit Report for 01/01/2023 Arrival Information Details Patient Name: Date of Service: [name] [facility] 01/01/2023 11:00 A M Medical Record Number: [id number] Patient Account Number: [id number] Date of Birth/Sex: Treating RN: 03/01/1941 (82 y.o. [name] Primary Care Kowen Kluth: Terrill Mohr Other Clinician: Referring Quetzally Callas: Treating Reeder Brisby/Extender: Renaldo Harrison in Treatment: 9 Visit Information History Since Last Visit Added or deleted any medications: No Patient Arrived: [name] Any new allergies or adverse reactions: No Arrival Time: 11:23 Had a fall or experienced change in No Accompanied By: friend activities of daily living that may affect Transfer Assistance: None risk of falls: Patient Identification Verified: Yes Signs or symptoms of abuse/neglect since last visito No Hospitalized since last visit: No Implantable device outside of the clinic excluding No cellular tissue based products placed in the center since last visit: Has Dressing in Place as Prescribed: Yes Has Compression in Place as Prescribed: Yes Pain Present Now: No Electronic Signature(s) Signed: 01/01/2023 4:57:03 PM By: Dellie Catholic RN Entered By: Dellie Catholic on 01/01/2023 11:23:59 -------------------------------------------------------------------------------- Compression Therapy Details Patient Name: Date of Service: [name] 01/01/2023 11:00 A M Medical Record Number: [id number] Patient Account Number: [id number] Date of Birth/Sex: Treating RN: Aug 06, 1941 (82 y.o. [name] Primary Care Jahniya Duzan: Terrill Mohr Other Clinician: Referring Kristopher Attwood: Treating Donyell Carrell/Extender: Serita Kyle Weeks in Treatment: 9 Compression Therapy Performed for Wound Assessment: Wound #1 Right,Anterior Lower Leg Performed By: Clinician Dellie Catholic,  RN Compression Type: Three Layer Post Procedure Diagnosis Same as Pre-procedure Electronic Signature(s) Signed: 01/01/2023 4:57:03 PM By: Dellie Catholic RN Entered By: Dellie Catholic on 01/01/2023 11:44:42 [place], [name] ([id number].pdf Page 2 of 7 -------------------------------------------------------------------------------- Encounter Discharge Information Details Patient Name: Date of Service: [name] 01/01/2023 11:00 A M Medical Record Number: [id number] Patient Account Number: [id number] Date of Birth/Sex: Treating RN: [birth year] (82 y.o. [name] Primary Care Merrillyn Ackerley: Terrill Mohr Other Clinician: Referring Adrain Butrick: Treating Marquerite Forsman/Extender: Serita Kyle Weeks in Treatment: 9 Encounter Discharge Information Items Post Procedure Vitals Discharge Condition: Stable Temperature (F): 98.4 Ambulatory Status: Cane Pulse (bpm): 85 Discharge Destination: Home Respiratory Rate (breaths/min): 18 Transportation: Private Auto Blood Pressure (mmHg): 150/86 Accompanied By: friend Schedule Follow-up Appointment: Yes Clinical Summary of Care: Patient Declined Electronic Signature(s) Signed: 01/01/2023 4:57:03 PM By: Dellie Catholic RN Entered By: Dellie Catholic on 01/01/2023 12:48:11 -------------------------------------------------------------------------------- Lower Extremity Assessment Details Patient Name: Date of Service: [name] 01/01/2023 11:00 A M Medical Record Number: [id number] Patient Account Number: [id number] Date of Birth/Sex: Treating RN: [birth year] (82 y.o. [name] Primary Care Terri Rorrer: Terrill Mohr Other Clinician: Referring Mitchelle Sultan: Treating Lindsay Soulliere/Extender: Serita Kyle Weeks in Treatment: 9 Edema Assessment Assessed: [Left: No] [name]: No] [Left: Edema] [Right: :] Calf Left: Right: Point of Measurement: 36 cm From Medial Instep  44 cm 37 cm Ankle Left: Right: Point of Measurement: 10 cm From Medial Instep 26 cm 24 cm Vascular Assessment Pulses: Dorsalis Pedis Palpable: [Right:Yes] Electronic Signature(s) Signed: 01/01/2023 4:57:03 PM By: Dellie Catholic RN Entered By: Dellie Catholic on 01/01/2023 11:35:13 Multi Wound Chart Details -------------------------------------------------------------------------------- [name] ([id number].pdf Page 3 of 7 Patient Name: Date of Service: [name] 01/01/2023 11:00 A M Medical Record Number: [id number] Patient Account Number: [id number] Date of Birth/Sex: Treating RN: 08-23-1941 (82 y.o. F) Primary Care Meiko Stranahan: Terrill Mohr Other Clinician: Referring Larnell Granlund: Treating Kolten Ryback/Extender: Renaldo Harrison  in Treatment: 9 Vital Signs Height(in): 64 Pulse(bpm): 85 Weight(lbs): 180 Blood Pressure(mmHg): 150/86 Body Mass Index(BMI): 30.9 Temperature(F): 98.4 Respiratory Rate(breaths/min): 18 Wound Assessments Wound Number: 1 N/A N/A Photos: N/A N/A Right, Anterior Lower Leg N/A N/A Wound Location: Gradually Appeared N/A N/A Wounding Event: Venous Leg Ulcer N/A N/A Primary Etiology: Asthma, Hypertension, Osteoarthritis N/A N/A Comorbid History: 09/30/2022 N/A N/A Date Acquired: 9 N/A N/A Weeks of Treatment: Open N/A N/A Wound Status: No N/A N/A Wound Recurrence: Yes N/A N/A Clustered Wound: 6.9x0.5x0.1 N/A N/A Measurements L x W x D (cm) 2.71 N/A N/A A (cm) : rea 0.271 N/A N/A Volume (cm) : 93.00% N/A N/A % Reduction in A rea: 93.00% N/A N/A % Reduction in Volume: Full Thickness Without Exposed N/A N/A Classification: Support Structures Large N/A N/A Exudate A mount: Serosanguineous N/A N/A Exudate Type: red, [name] N/A N/A Exudate Color: Distinct, outline attached N/A N/A Wound Margin: Medium (34-66%) N/A N/A Granulation A mount: Red, Hyper-granulation N/A  N/A Granulation Quality: Medium (34-66%) N/A N/A Necrotic A mount: Eschar, Adherent Slough N/A N/A Necrotic Tissue: Fat Layer (Subcutaneous Tissue): Yes N/A N/A Exposed Structures: Fascia: No Tendon: No Muscle: No Joint: No Bone: No Small (1-33%) N/A N/A Epithelialization: Debridement - Selective/Open Wound N/A N/A Debridement: Pre-procedure Verification/Time Out 11:35 N/A N/A Taken: Lidocaine 4% Topical Solution N/A N/A Pain Control: Slough N/A N/A Tissue Debrided: Non-Viable Tissue N/A N/A Level: 3.45 N/A N/A Debridement A (sq cm): rea Curette N/A N/A Instrument: Minimum N/A N/A Bleeding: Pressure N/A N/A Hemostasis A chieved: 0 N/A N/A Procedural Pain: 0 N/A N/A Post Procedural Pain: Procedure was tolerated well N/A N/A Debridement Treatment Response: 6.9x0.5x0.1 N/A N/A Post Debridement Measurements L x W x D (cm) 0.271 N/A N/A Post Debridement Volume: (cm) Scarring: Yes N/A N/A Periwound Skin Texture: Excoriation: No Maceration: No N/A N/A Periwound Skin Moisture: Dry/Scaly: No Hemosiderin Staining: Yes N/A N/A Periwound Skin Color: No Abnormality N/A N/A Temperature: Compression Therapy N/A N/A Procedures Performed: Debridement [name], [name] ([id number].pdf Page 4 of 7 Treatment Notes Electronic Signature(s) Signed: 01/01/2023 11:53:17 AM By: [name] Entered By: [name] Maudlin on 01/01/2023 11:53:17 -------------------------------------------------------------------------------- Multi-Disciplinary Care Plan Details Patient Name: Date of Service: [name] 01/01/2023 11:00 A M Medical Record Number: [id number] Patient Account Number: [id number] Date of Birth/Sex: Treating RN: 02-11-1941 (82 y.o. [name] Primary Care Clotine Heiner: Terrill Mohr Other Clinician: Referring Iyona Pehrson: Treating Rane Blitch/Extender: Serita Kyle Weeks in Treatment: 9 Active  Inactive Wound/Skin Impairment Nursing Diagnoses: Impaired tissue integrity Goals: Patient/caregiver will verbalize understanding of skin care regimen Date Initiated: 10/30/2022 Target Resolution Date: 03/24/2023 Goal Status: Active Interventions: Assess ulceration(s) every visit Treatment Activities: Skin care regimen initiated : 10/30/2022 Notes: Electronic Signature(s) Signed: 01/01/2023 4:57:03 PM By: Dellie Catholic RN Entered By: Dellie Catholic on 01/01/2023 12:46:36 -------------------------------------------------------------------------------- Pain Assessment Details Patient Name: Date of Service: [name] 01/01/2023 11:00 A M Medical Record Number: [id number] Patient Account Number: [id number] Date of Birth/Sex: Treating RN: 1941-03-02 (82 y.o. [name] Primary Care Kevis Qu: Terrill Mohr Other Clinician: Referring Crue Otero: Treating [name]/Extender: Serita Kyle Weeks in Treatment: 9 Active Problems Location of Pain Severity and Description of Pain Patient Has Paino No Site [name], [name] ([id number]) [id number] Page 5 of 7 Pain Management and Medication Current Pain Management: Electronic Signature(s) Signed: 01/01/2023 4:57:03 PM By: Dellie Catholic RN Entered By: Dellie Catholic on 01/01/2023 11:34:53 -------------------------------------------------------------------------------- Patient/Caregiver Education Details Patient Name: Date of Service: PA  DDISO [name] 2/8/2024andnbsp11:00 A M Medical Record Number: [id number] Patient Account Number: [id number] Date of Birth/Gender: Treating RN: October 07, 1941 (82 y.o. [name] Primary Care Physician: Terrill Mohr Other Clinician: Referring Physician: Treating Physician/Extender: Renaldo Harrison in Treatment: 9 Education Assessment Education Provided To: Patient Education Topics Provided Wound/Skin  Impairment: Methods: Explain/Verbal Responses: Return demonstration correctly Electronic Signature(s) Signed: 01/01/2023 4:57:03 PM By: Dellie Catholic RN Entered By: Dellie Catholic on 01/01/2023 12:46:53 -------------------------------------------------------------------------------- Wound Assessment Details Patient Name: Date of Service: [name] 01/01/2023 11:00 A M Medical Record Number: [id number] Patient Account Number: [id number] Date of Birth/Sex: Treating RN: [birth year] (82 y.o. [name] Primary Care Nusaiba Guallpa: Terrill Mohr Other Clinician: Referring Maize Brittingham: Treating [name], [name] ([id number]) [phone].pdf Page 6 of 7 Weeks in Treatment: 9 Wound Status Wound Number: 1 Primary Etiology: Venous Leg Ulcer Wound Location: Right, Anterior Lower Leg Wound Status: Open Wounding Event: Gradually Appeared Comorbid History: Asthma, Hypertension, Osteoarthritis Date Acquired: 09/30/2022 Weeks Of Treatment: 9 Clustered Wound: Yes Photos Wound Measurements Length: (cm) 6.9 Width: (cm) 0.5 Depth: (cm) 0.1 Area: (cm) 2.71 Volume: (cm) 0.271 % Reduction in Area: 93% % Reduction in Volume: 93% Epithelialization: Small (1-33%) Tunneling: No Undermining: No Wound Description Classification: Full Thickness Without Exposed Support Structures Wound Margin: Distinct, outline attached Exudate Amount: Large Exudate Type: Serosanguineous Exudate Color: red, [name] Foul Odor After Cleansing: No Slough/Fibrino Yes Wound Bed Granulation Amount: Medium (34-66%) Exposed Structure Granulation Quality: Red, Hyper-granulation Fascia Exposed: No Necrotic Amount: Medium (34-66%) Fat Layer (Subcutaneous Tissue) Exposed: Yes Necrotic Quality: Eschar, Adherent Slough Tendon Exposed: No Muscle Exposed: No Joint Exposed: No Bone Exposed: No Periwound Skin Texture Texture Color No  Abnormalities Noted: No No Abnormalities Noted: No Excoriation: No Hemosiderin Staining: Yes Scarring: Yes Temperature / Pain Temperature: No Abnormality Moisture No Abnormalities Noted: No Dry / Scaly: No Maceration: No Treatment Notes Wound #1 (Lower Leg) Wound Laterality: Right, Anterior Cleanser Soap and Water Discharge Instruction: May shower and wash wound with dial antibacterial soap and water prior to dressing change. Wound Cleanser Discharge Instruction: Cleanse the wound with wound cleanser prior to applying a clean dressing using gauze sponges, not tissue or cotton balls. Peri-Wound Care Sween Lotion (Moisturizing lotion) Discharge Instruction: Apply moisturizing lotion as directed Topical Fedak, [name] ([id number].pdf Page 7 of 7 Mupirocin Ointment Discharge Instruction: Apply Mupirocin (Bactroban) as instructed Primary Dressing Sorbalgon AG Dressing, 4x4 (in/in) Discharge Instruction: Apply to wound bed as instructed Secondary Dressing Woven Gauze Sponge, Non-Sterile 4x4 in Discharge Instruction: Apply over primary dressing as directed. Zetuvit Plus 4x8 in Discharge Instruction: Apply over primary dressing as directed. Secured With Compression Wrap ThreePress (3 layer compression wrap) Discharge Instruction: Apply three layer compression as directed. Compression Stockings Add-Ons Electronic Signature(s) Signed: 01/01/2023 4:57:03 PM By: Dellie Catholic RN Entered By: Dellie Catholic on 01/01/2023 11:29:14 -------------------------------------------------------------------------------- Vitals Details Patient Name: Date of Service: [name] 01/01/2023 11:00 A M Medical Record Number: [id number] Patient Account Number: [id number] Date of Birth/Sex: Treating RN: September 30, 1941 (82 y.o. [name] Primary Care Aesha Agrawal: Terrill Mohr Other Clinician: Referring Demaurion Dicioccio: Treating Adarsh Mundorf/Extender: Serita Kyle Weeks in Treatment: 9 Vital Signs Time Taken: 11:16 Temperature (F): 98.4 Height (in): 64 Pulse (bpm): 85 Weight (lbs): 180 Respiratory Rate (breaths/min): 18 Body Mass Index (BMI): 30.9 Blood Pressure (mmHg): 150/86 Reference Range: 80 - 120 mg / dl Electronic Signature(s) Signed: 01/01/2023 4:57:03 PM By: Dellie Catholic RN Entered By:  Dellie Catholic on 01/01/2023 11:26:41

## 2023-01-08 ENCOUNTER — Encounter (HOSPITAL_BASED_OUTPATIENT_CLINIC_OR_DEPARTMENT_OTHER): Payer: Medicare Other | Admitting: General Surgery

## 2023-01-08 DIAGNOSIS — L97812 Non-pressure chronic ulcer of other part of right lower leg with fat layer exposed: Secondary | ICD-10-CM | POA: Diagnosis not present

## 2023-01-09 NOTE — Progress Notes (Signed)
[name] ([id number] Page 1 of 7 Visit Report for 01/08/2023 Arrival Information Details Patient Name: Date of Service: PA DDISO [name] [facility] 01/08/2023 11:00 A M Medical Record Number: [id number] Patient Account Number: [id number] Date of Birth/Sex: Treating RN: Oct 01, 1941 (82 y.o. [name] Primary Care Kamla Skilton: Terrill Mohr Other Clinician: Referring Eddye Broxterman: Treating Allyn Bertoni/Extender: Renaldo Harrison in Treatment: 10 Visit Information History Since Last Visit Added or deleted any medications: No Patient Arrived: [name] Any new allergies or adverse reactions: No Arrival Time: 11:27 Had a fall or experienced change in No Accompanied By: friend activities of daily living that may affect Transfer Assistance: None risk of falls: Patient Identification Verified: Yes Signs or symptoms of abuse/neglect since last visito No Hospitalized since last visit: No Implantable device outside of the clinic excluding No cellular tissue based products placed in the center since last visit: Has Dressing in Place as Prescribed: Yes Has Compression in Place as Prescribed: Yes Pain Present Now: Yes Electronic Signature(s) Signed: 01/08/2023 1:04:45 PM By: Dellie Catholic RN Entered By: Dellie Catholic on 01/08/2023 11:47:09 -------------------------------------------------------------------------------- Compression Therapy Details Patient Name: Date of Service: [name] 01/08/2023 11:00 A M Medical Record Number: [id number] Patient Account Number: [id number] Date of Birth/Sex: Treating RN: 01-16-1941 (82 y.o. [name] Primary Care Gianni Fuchs: Terrill Mohr Other Clinician: Referring Louana Fontenot: Treating Charee Tumblin/Extender: Serita Kyle Weeks in Treatment: 10 Compression Therapy Performed for Wound Assessment: Wound #1 Right,Anterior Lower Leg Performed By: Clinician Dellie Catholic, RN Compression Type: Three Layer Post Procedure Diagnosis Same as Pre-procedure Electronic Signature(s) Signed: 01/08/2023 1:04:45 PM By: Dellie Catholic RN Entered By: Dellie Catholic on 01/08/2023 11:51:12 Fiebelkorn, [name] ([id number] Page 2 of 7 -------------------------------------------------------------------------------- Encounter Discharge Information Details Patient Name: Date of Service: [name] 01/08/2023 11:00 A M Medical Record Number: [id number] Patient Account Number: [id number] Date of Birth/Sex: Treating RN: 1941/08/30 (82 y.o. [name] Primary Care Kelena Garrow: Terrill Mohr Other Clinician: Referring Gleb Mcguire: Treating Meria Crilly/Extender: Serita Kyle Weeks in Treatment: 10 Encounter Discharge Information Items Post Procedure Vitals Discharge Condition: Stable Temperature (F): 97.8 Ambulatory Status: Cane Pulse (bpm): 81 Discharge Destination: Home Respiratory Rate (breaths/min): 18 Transportation: Private Auto Blood Pressure (mmHg): 122/73 Accompanied By: self Schedule Follow-up Appointment: Yes Clinical Summary of Care: Patient Declined Electronic Signature(s) Signed: 01/08/2023 1:04:45 PM By: Dellie Catholic RN Entered By: Dellie Catholic on 01/08/2023 13:03:12 -------------------------------------------------------------------------------- Lower Extremity Assessment Details Patient Name: Date of Service: [name] 01/08/2023 11:00 A M Medical Record Number: [id number] Patient Account Number: [id number] Date of Birth/Sex: Treating RN: 12/03/1940 (82 y.o. [name] Primary Care [name]: Terrill Mohr Other Clinician: Referring Barrett Holthaus: Treating Samon Dishner/Extender: Serita Kyle Weeks in Treatment: 10 Edema Assessment Assessed: Shirlyn Goltz: No] Patrice Paradise: No] [Left: Edema] [Right: :] Calf Left: Right: Point of Measurement: 36 cm From  Medial Instep 44 cm 37 cm Ankle Left: Right: Point of Measurement: 10 cm From Medial Instep 26 cm 24 cm Vascular Assessment Pulses: Dorsalis Pedis Palpable: [Left:Yes] Electronic Signature(s) Signed: 01/08/2023 1:04:45 PM By: Dellie Catholic RN Entered By: Dellie Catholic on 01/08/2023 11:48:47 Multi Wound Chart Details -------------------------------------------------------------------------------- [name] ([id number] Page 3 of 7 Patient Name: Date of Service: [name] 01/08/2023 11:00 A M Medical Record Number: [id number] Patient Account Number: [id number] Date of Birth/Sex: Treating RN: 06/12/1941 (82 y.o. F) Primary Care [name]: Terrill Mohr Other Clinician: Referring [name]: Treating Sweet Jarvis/Extender: Renaldo Harrison  in Treatment: 10 Vital Signs Height(in): 64 Pulse(bpm): 81 Weight(lbs): 180 Blood Pressure(mmHg): 122/73 Body Mass Index(BMI): 30.9 Temperature(F): 97.8 Respiratory Rate(breaths/min): 18 Wound Assessments Wound Number: 1 N/A N/A Photos: N/A N/A Right, Anterior Lower Leg N/A N/A Wound Location: Gradually Appeared N/A N/A Wounding Event: Venous Leg Ulcer N/A N/A Primary Etiology: Asthma, Hypertension, Osteoarthritis N/A N/A Comorbid History: 09/30/2022 N/A N/A Date Acquired: 10 N/A N/A Weeks of Treatment: Open N/A N/A Wound Status: No N/A N/A Wound Recurrence: Yes N/A N/A Clustered Wound: 6x0.4x0.1 N/A N/A Measurements L x W x D (cm) 1.885 N/A N/A A (cm) : rea 0.188 N/A N/A Volume (cm) : 95.20% N/A N/A % Reduction in A rea: 95.20% N/A N/A % Reduction in Volume: Full Thickness Without Exposed N/A N/A Classification: Support Structures Large N/A N/A Exudate A mount: Serosanguineous N/A N/A Exudate Type: red, [name] N/A N/A Exudate Color: Distinct, outline attached N/A N/A Wound Margin: Medium (34-66%) N/A N/A Granulation A mount: Red,  Hyper-granulation N/A N/A Granulation Quality: Medium (34-66%) N/A N/A Necrotic A mount: Eschar, Adherent Slough N/A N/A Necrotic Tissue: Fat Layer (Subcutaneous Tissue): Yes N/A N/A Exposed Structures: Fascia: No Tendon: No Muscle: No Joint: No Bone: No Small (1-33%) N/A N/A Epithelialization: Debridement - Selective/Open Wound N/A N/A Debridement: Pre-procedure Verification/Time Out 11:45 N/A N/A Taken: Lidocaine 5% topical ointment N/A N/A Pain Control: Necrotic/Eschar, Slough N/A N/A Tissue Debrided: Non-Viable Tissue N/A N/A Level: 2.4 N/A N/A Debridement A (sq cm): rea Curette N/A N/A Instrument: Minimum N/A N/A Bleeding: Pressure N/A N/A Hemostasis A chieved: 0 N/A N/A Procedural Pain: 0 N/A N/A Post Procedural Pain: Procedure was tolerated well N/A N/A Debridement Treatment Response: 6x0.4x0.1 N/A N/A Post Debridement Measurements L x W x D (cm) 0.188 N/A N/A Post Debridement Volume: (cm) Scarring: Yes N/A N/A Periwound Skin Texture: Excoriation: No Maceration: No N/A N/A Periwound Skin Moisture: Dry/Scaly: No Hemosiderin Staining: Yes N/A N/A Periwound Skin Color: No Abnormality N/A N/A Temperature: clustered wound N/A N/A Assessment Notes: Compression Therapy N/A N/A Procedures Performed: Debridement Sheu, [name] ([id number] Page 4 of 7 Treatment Notes Electronic Signature(s) Signed: 01/08/2023 11:55:44 AM By: Fredirick Maudlin MD FACS Entered By: Fredirick Maudlin on 01/08/2023 11:55:44 -------------------------------------------------------------------------------- Multi-Disciplinary Care Plan Details Patient Name: Date of Service: [name] 01/08/2023 11:00 A M Medical Record Number: [id number] Patient Account Number: [id number] Date of Birth/Sex: Treating RN: 1941-10-14 (82 y.o. [name] Primary Care Reganne Messerschmidt: Terrill Mohr Other Clinician: Referring Roxene Alviar: Treating  Kambri Dismore/Extender: Serita Kyle Weeks in Treatment: 10 Active Inactive Wound/Skin Impairment Nursing Diagnoses: Impaired tissue integrity Goals: Patient/caregiver will verbalize understanding of skin care regimen Date Initiated: 10/30/2022 Target Resolution Date: 03/24/2023 Goal Status: Active Interventions: Assess ulceration(s) every visit Treatment Activities: Skin care regimen initiated : 10/30/2022 Notes: Electronic Signature(s) Signed: 01/08/2023 1:04:45 PM By: Dellie Catholic RN Entered By: Dellie Catholic on 01/08/2023 13:01:18 -------------------------------------------------------------------------------- Pain Assessment Details Patient Name: Date of Service: [name] 01/08/2023 11:00 A M Medical Record Number: [id number] Patient Account Number: [id number] Date of Birth/Sex: Treating RN: 1941-06-07 (82 y.o. [name] Primary Care Daquan Crapps: Terrill Mohr Other Clinician: Referring Bevin Mayall: Treating Brendalyn Vallely/Extender: Serita Kyle Weeks in Treatment: 10 Active Problems Location of Pain Severity and Description of Pain Patient Has Paino Yes Site Locations Pain Location: SARISSA, AZLIN ([id number]) [id number].pdf Page 5 of 7 Pain Location: Generalized Pain With Dressing Change: Yes Duration of the Pain. Constant / Intermittento Constant Rate the pain. Current Pain Level: 4 Worst Pain  Level: 10 Least Pain Level: 3 Tolerable Pain Level: 4 Character of Pain Describe the Pain: Tender Pain Management and Medication Current Pain Management: Medication: No Cold Application: No Rest: No Massage: No Activity: No T.E.N.S.: No Heat Application: No Leg drop or elevation: No Is the Current Pain Management Adequate: Inadequate How does your wound impact your activities of daily livingo Sleep: No Bathing: No Appetite: No Relationship With Others: No Bladder Continence: No Emotions:  No Bowel Continence: No Work: No Toileting: No Drive: No Dressing: No Hobbies: No Electronic Signature(s) Signed: 01/08/2023 1:04:45 PM By: Dellie Catholic RN Entered By: Dellie Catholic on 01/08/2023 11:48:27 -------------------------------------------------------------------------------- Patient/Caregiver Education Details Patient Name: Date of Service: [name] 2/15/2024andnbsp11:00 A M Medical Record Number: [id number] Patient Account Number: [id number] Date of Birth/Gender: Treating RN: Sep 16, 1941 (82 y.o. [name] Primary Care Physician: Terrill Mohr Other Clinician: Referring Physician: Treating Physician/Extender: Renaldo Harrison in Treatment: 10 Education Assessment Education Provided To: Patient Education Topics Provided Wound/Skin Impairment: Methods: Explain/Verbal Responses: Return demonstration correctly Electronic Signature(s) Signed: 01/08/2023 1:04:45 PM By: Dellie Catholic RN Schlitt, Eren ([id number]) [id number].pdf Page 6 of 7 Entered By: Dellie Catholic on 01/08/2023 13:01:38 -------------------------------------------------------------------------------- Wound Assessment Details Patient Name: Date of Service: [name] 01/08/2023 11:00 A M Medical Record Number: [id number] Patient Account Number: [id number] Date of Birth/Sex: Treating RN: 1941/08/29 (82 y.o. [name] Primary Care Wladyslawa Disbro: Terrill Mohr Other Clinician: Referring Romuald Mccaslin: Treating Camron Monday/Extender: Serita Kyle Weeks in Treatment: 10 Wound Status Wound Number: 1 Primary Etiology: Venous Leg Ulcer Wound Location: Right, Anterior Lower Leg Wound Status: Open Wounding Event: Gradually Appeared Comorbid History: Asthma, Hypertension, Osteoarthritis Date Acquired: 09/30/2022 Weeks Of Treatment: 10 Clustered Wound: Yes Photos Wound Measurements Length: (cm) 6 Width: (cm)  0.4 Depth: (cm) 0.1 Area: (cm) 1.885 Volume: (cm) 0.188 % Reduction in Area: 95.2% % Reduction in Volume: 95.2% Epithelialization: Small (1-33%) Tunneling: No Undermining: No Wound Description Classification: Full Thickness Without Exposed Support Structures Wound Margin: Distinct, outline attached Exudate Amount: Large Exudate Type: Serosanguineous Exudate Color: red, [name] Foul Odor After Cleansing: No Slough/Fibrino Yes Wound Bed Granulation Amount: Medium (34-66%) Exposed Structure Granulation Quality: Red, Hyper-granulation Fascia Exposed: No Necrotic Amount: Medium (34-66%) Fat Layer (Subcutaneous Tissue) Exposed: Yes Necrotic Quality: Eschar, Adherent Slough Tendon Exposed: No Muscle Exposed: No Joint Exposed: No Bone Exposed: No Periwound Skin Texture Texture Color No Abnormalities Noted: No No Abnormalities Noted: No Excoriation: No Hemosiderin Staining: Yes Scarring: Yes Temperature / Pain Temperature: No Abnormality Moisture No Abnormalities Noted: No Dry / Scaly: No Maceration: No Orourke, Aanvi ([id number] Page 7 of 7 Assessment Notes clustered wound Treatment Notes Wound #1 (Lower Leg) Wound Laterality: Right, Anterior Cleanser Soap and Water Discharge Instruction: May shower and wash wound with dial antibacterial soap and water prior to dressing change. Wound Cleanser Discharge Instruction: Cleanse the wound with wound cleanser prior to applying a clean dressing using gauze sponges, not tissue or cotton balls. Peri-Wound Care Sween Lotion (Moisturizing lotion) Discharge Instruction: Apply moisturizing lotion as directed Topical Mupirocin Ointment Discharge Instruction: Apply Mupirocin (Bactroban) as instructed Primary Dressing Sorbalgon AG Dressing, 4x4 (in/in) Discharge Instruction: Apply to wound bed as instructed Secondary Dressing ABD Pad, 8x10 Discharge Instruction: Apply over primary dressing  as directed. Woven Gauze Sponge, Non-Sterile 4x4 in Discharge Instruction: Apply over primary dressing as directed. Secured With Compression Wrap ThreePress (3 layer compression wrap) Discharge Instruction: Apply three layer compression as directed. Compression Stockings Add-Ons Electronic Signature(s)  Signed: 01/08/2023 1:04:45 PM By: Dellie Catholic RN Entered By: Dellie Catholic on 01/08/2023 11:49:23 -------------------------------------------------------------------------------- Vitals Details Patient Name: Date of Service: [name] 01/08/2023 11:00 A M Medical Record Number: [id number] Patient Account Number: [id number] Date of Birth/Sex: Treating RN: [birth year] (82 y.o. [name] Primary Care Raney Koeppen: Terrill Mohr Other Clinician: Referring Faust Thorington: Treating Yamil Dougher/Extender: Serita Kyle Weeks in Treatment: 10 Vital Signs Time Taken: 11:28 Temperature (F): 97.8 Height (in): 64 Pulse (bpm): 81 Weight (lbs): 180 Respiratory Rate (breaths/min): 18 Body Mass Index (BMI): 30.9 Blood Pressure (mmHg): 122/73 Reference Range: 80 - 120 mg / dl Electronic Signature(s) Signed: 01/08/2023 1:04:45 PM By: Dellie Catholic RN Entered By: Dellie Catholic on 01/08/2023 11:47:41

## 2023-01-09 NOTE — Progress Notes (Signed)
[name] ([id number] Page 1 of 9 Visit Report for 01/08/2023 Chief Complaint Document Details Patient Name: Date of Service: [name] [facility] 01/08/2023 11:00 A M Medical Record Number: [id number] Patient Account Number: [id number] Date of Birth/Sex: Treating RN: 25-Oct-1941 (82 y.o. F) Primary Care Provider: Terrill Mohr Other Clinician: Referring Provider: Treating Provider/Extender: Renaldo Harrison in Treatment: 10 Information Obtained from: Patient Chief Complaint Patient presents for treatment of open ulcers due to venous insufficiency Electronic Signature(s) Signed: 01/08/2023 11:55:53 AM By: Fredirick Maudlin MD FACS Entered By: Fredirick Maudlin on 01/08/2023 11:55:53 -------------------------------------------------------------------------------- Debridement Details Patient Name: Date of Service: PA DDISO [name] 01/08/2023 11:00 A M Medical Record Number: [id number] Patient Account Number: [id number] Date of Birth/Sex: Treating RN: [birth year] (82 y.o. [name] Primary Care Provider: Terrill Mohr Other Clinician: Referring Provider: Treating Provider/Extender: Serita Kyle Weeks in Treatment: 10 Debridement Performed for Assessment: Wound #1 Right,Anterior Lower Leg Performed By: Physician Fredirick Maudlin, MD Debridement Type: Debridement Severity of Tissue Pre Debridement: Fat layer exposed Level of Consciousness (Pre-procedure): Awake and Alert Pre-procedure Verification/Time Out Yes - 11:45 Taken: Start Time: 11:45 Pain Control: Lidocaine 5% topical ointment T Area Debrided (L x W): otal 6 (cm) x 0.4 (cm) = 2.4 (cm) Tissue and other material debrided: Non-Viable, Eschar, Slough, Slough Level: Non-Viable Tissue Debridement Description: Selective/Open Wound Instrument: Curette Bleeding: Minimum Hemostasis Achieved: Pressure End Time: 11:48 Procedural Pain:  0 Post Procedural Pain: 0 Response to Treatment: Procedure was tolerated well Level of Consciousness (Post- Awake and Alert procedure): Post Debridement Measurements of Total Wound Length: (cm) 6 Width: (cm) 0.4 Depth: (cm) 0.1 Volume: (cm) 0.188 Character of Wound/Ulcer Post Debridement: Improved Severity of Tissue Post Debridement: Fat layer exposed [name], [name] ([id number] Page 2 of 9 Post Procedure Diagnosis Same as Pre-procedure Notes Scribed for Dr. Celine Ahr by J.Scotton Electronic Signature(s) Signed: 01/08/2023 12:30:57 PM By: Fredirick Maudlin MD FACS Signed: 01/08/2023 1:04:45 PM By: Dellie Catholic RN Entered By: Dellie Catholic on 01/08/2023 11:50:57 -------------------------------------------------------------------------------- HPI Details Patient Name: Date of Service: PA DDISO [name] 01/08/2023 11:00 A M Medical Record Number: [id number] Patient Account Number: [id number] Date of Birth/Sex: Treating RN: Jul 21, 1941 (82 y.o. 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 The patient and her family elected for comfort care however somehow she managed to heal her wounds. From the electronic medical record, it looks like she was receiving home health services and being followed by her primary care provider. At the beginning of November, she saw her PCP again and had new ulcers on her right leg. Home health services were not available to assist the patient and she was referred to the wound care  center for further evaluation and management. On the right anterior lower leg, she has multiple ulcers of varying degrees and depths. Some are limited to breakdown of skin and others are deeper, into the fat layer. There is slough and some eschar accumulation. She has periwound erythema and 1-2+ edema. 12/14-second visit for this woman who arrived to clinic accompanied by a neighbor. 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 She  tells me that in 2021 she had bilateral lower leg wounds which took an ordinarily long period of time to healo 2 years. The current wounds have been on the right leg for about 2 months. She may or may not have been wearing a support stocking on the right leg. She has not been using anything on the left but fortunately does not have any wounds there. She makes it clear to me that she cannot get standard over the toe stockings on herself. Her husband passed away sometime earlier this year I believe 11/13/2022: All of the wounds seem to have progressed, getting larger and at this point and the fat layer is exposed in all locations. There is slough accumulation on all surfaces. 11/20/2022: Her wounds continue to worsen. They have expanded and there is thick slough on all of the wound surfaces. She is experiencing more pain. 11/28/2022: The cultures that I took last week grew out methicillin-resistant Staph aureus so we discontinued Augmentin and initiated doxycycline. She is currently taking this. Her wounds are less painful today. They are all about the same size, but they have not expanded. There is rubbery slough on the wound surfaces. 12/04/2022: The wounds look improved today after being on doxycycline and using topical mupirocin. There is still a fair amount of slough accumulation. 12/11/2022: The wounds are smaller again this week. They continue to accumulate some slough and remain tender. She has completed the oral doxycycline. 12/18/2022: The wounds  continue to contract. A couple of the smaller ones have scabbed over. There is slough and eschar accumulation. They are less tender than on prior occasions. 12/25/2022: The wounds are all much smaller. A number of them have healed under some eschar. There is still some slough accumulation and perimeter eschar present. She states she is no longer having any pain. 01/01/2023: She is down to just 3 remaining wounds. They are all smaller with just a little bit of slough and eschar. She is concerned about the redness and inflammation in her left leg. 01/08/2023: One of the 3 remaining wounds is healed underneath the layer of eschar. The other 2 have contracted considerably. Light slough and eschar present. Edema control is good. Electronic Signature(s) Signed: 01/08/2023 11:56:43 AM By: Fredirick Maudlin MD FACS Entered By: Fredirick Maudlin on 01/08/2023 11:56:43 Mcclane, [name] ([id number] Page 3 of 9 -------------------------------------------------------------------------------- Physical Exam Details Patient Name: Date of Service: PA DDISO [name] 01/08/2023 11:00 A M Medical Record Number: [id number] Patient Account Number: [id number] Date of Birth/Sex: Treating RN: 11/26/1940 (82 y.o. F) Primary Care Provider: Terrill Mohr Other Clinician: Referring Provider: Treating Provider/Extender: Serita Kyle Weeks in Treatment: 10 Constitutional . . . . no acute distress. Respiratory Normal work of breathing on room air. Notes 01/08/2023: One of the 3 remaining wounds is healed underneath the layer of eschar. The other 2 have contracted considerably. Light slough and eschar present. Edema control is good. Electronic Signature(s) Signed: 01/08/2023 11:57:10 AM By: Fredirick Maudlin MD FACS Entered By: Fredirick Maudlin on 01/08/2023 11:57:10 -------------------------------------------------------------------------------- Physician Orders  Details Patient Name: Date of Service: PA DDISO [name] 01/08/2023 11:00 A M Medical Record Number: [id number] Patient Account Number: [id number] Date of Birth/Sex: Treating RN: 02/26/1941 (82 y.o. [name] Primary Care Provider: Terrill Mohr Other Clinician: Referring Provider: Treating Provider/Extender: Serita Kyle Weeks in Treatment: 10 Verbal / Phone Orders: No Diagnosis Coding ICD-10 Coding Code Description [phone] Non-pressure chronic ulcer of other part of right lower leg with  fat layer exposed I87.2 Venous insufficiency (chronic) (peripheral) J44.89 Other specified chronic obstructive pulmonary disease I73.9 Peripheral vascular disease, unspecified Follow-up Appointments ppointment in 1 week. - Dr. Celine Ahr - Room 3 Return A Other: - Remember to pick up Triamcinolone Cream at the Pharmacy Anesthetic (In clinic) Topical Lidocaine 5% applied to wound bed Bathing/ Shower/ Hygiene Other Bathing/Shower/Hygiene Orders/Instructions: - May take a sponge bath Edema Control - Lymphedema / SCD / Other Elevate legs to the level of the heart or above for 30 minutes daily and/or when sitting for 3-4 times a day throughout the day. Avoid standing for long periods of time. Exercise regularly Moisturize legs daily. - Use lotion on left leg-STOP using the steroid cream Non Wound Condition Left Lower Extremity Wengert, [name] ([id number] Page 4 of 9 Other Non Wound Condition Orders/Instructions: - Place Triamcinolone Cream on lower left leg once a day as needed. Wound Treatment Wound #1 - Lower Leg Wound Laterality: Right, Anterior Cleanser: Soap and Water 1 x Per Week/30 Days Discharge Instructions: May shower and wash wound with dial antibacterial soap and water prior to dressing change. Cleanser: Wound Cleanser 1 x Per Week/30 Days Discharge Instructions: Cleanse the wound with wound cleanser prior to applying  a clean dressing using gauze sponges, not tissue or cotton balls. Peri-Wound Care: Sween Lotion (Moisturizing lotion) 1 x Per Week/30 Days Discharge Instructions: Apply moisturizing lotion as directed Topical: Mupirocin Ointment 1 x Per Week/30 Days Discharge Instructions: Apply Mupirocin (Bactroban) as instructed Prim Dressing: Sorbalgon AG Dressing, 4x4 (in/in) 1 x Per Week/30 Days ary Discharge Instructions: Apply to wound bed as instructed Secondary Dressing: Woven Gauze Sponge, Non-Sterile 4x4 in 1 x Per Week/30 Days Discharge Instructions: Apply over primary dressing as directed. Secondary Dressing: Zetuvit Plus 4x8 in 1 x Per Week/30 Days Discharge Instructions: Apply over primary dressing as directed. Compression Wrap: ThreePress (3 layer compression wrap) 1 x Per Week/30 Days Discharge Instructions: Apply three layer compression as directed. Electronic Signature(s) Signed: 01/08/2023 12:30:57 PM By: Fredirick Maudlin MD FACS Entered By: Fredirick Maudlin on 01/08/2023 11:57:21 -------------------------------------------------------------------------------- Problem List Details Patient Name: Date of Service: PA DDISO [name] 01/08/2023 11:00 A M Medical Record Number: [id number] Patient Account Number: [id number] Date of Birth/Sex: Treating RN: Jul 15, 1941 (82 y.o. F) Primary Care Provider: Terrill Mohr Other Clinician: Referring Provider: Treating Provider/Extender: Serita Kyle Weeks in Treatment: 10 Active Problems ICD-10 Encounter Code Description Active Date MDM Diagnosis [phone] Non-pressure chronic ulcer of other part of right lower leg with fat layer 10/30/2022 No Yes exposed I87.2 Venous insufficiency (chronic) (peripheral) 10/30/2022 No Yes J44.89 Other specified chronic obstructive pulmonary disease 10/30/2022 No Yes I73.9 Peripheral vascular disease, unspecified 10/30/2022 No Yes Inactive Problems Viglione, Alexzandra ([id number] Page 5 of 9 Resolved Problems Electronic Signature(s) Signed: 01/08/2023 11:55:35 AM By: Fredirick Maudlin MD FACS Entered By: Fredirick Maudlin on 01/08/2023 11:55:35 -------------------------------------------------------------------------------- Progress Note Details Patient Name: Date of Service: PA DDISO [name] 01/08/2023 11:00 A M Medical Record Number: [id number] Patient Account Number: [id number] Date of Birth/Sex: Treating RN: 02/16/1941 (82 y.o. 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 The patient and her family elected for comfort care however somehow she managed to heal her wounds. From the electronic medical record, it looks like she was receiving home health services and being followed by her primary care provider. At the beginning of November, she saw her PCP again and had new ulcers on her right leg. Home health services were not available to assist the patient and she was referred to the wound care center for further evaluation and management. On the right anterior lower leg, she has multiple ulcers of varying degrees and depths. Some are limited to breakdown of skin and others are deeper, into the fat layer. There is slough and some eschar accumulation. She has periwound erythema and 1-2+ edema. 12/14-second visit for this woman who arrived to clinic accompanied by a  neighbor. 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 She tells me that in 2021 she had bilateral lower leg wounds which took an ordinarily long period of time to healo 2 years. The current wounds have been on the right leg for about 2 months. She may or may not have been wearing a support stocking on the right leg. She has not been using anything on the left but fortunately does not have any wounds there. She makes it clear to me that she cannot get standard over the toe stockings on herself. Her husband passed away sometime earlier this year I believe 11/13/2022: All of the wounds seem to have progressed, getting larger and at this point and the fat layer is exposed in all locations. There is slough accumulation on all surfaces. 11/20/2022: Her wounds continue to worsen. They have expanded and there is thick slough on all of the wound surfaces. She is experiencing more pain. 11/28/2022: The cultures that I took last week grew out methicillin-resistant Staph aureus so we discontinued Augmentin and initiated doxycycline. She is currently taking this. Her wounds are less painful today. They are all about the same size, but they have not expanded. There is rubbery slough on the wound surfaces. 12/04/2022: The wounds look improved today after being on doxycycline and using topical mupirocin. There is still a fair amount of slough accumulation. 12/11/2022: The wounds are smaller again this week. They continue to accumulate some slough and remain tender. She has completed the oral doxycycline. 12/18/2022: The wounds continue to contract. A couple of the smaller ones have scabbed over. There is slough and eschar accumulation. They are less tender than on prior occasions. 12/25/2022: The wounds are all much smaller. A number of them have healed under some eschar. There is still some slough accumulation and perimeter eschar present. She states she is no longer having any pain. 01/01/2023: She  is down to just 3 remaining wounds. They are all smaller with just a little bit of slough and eschar. She is concerned about the redness and inflammation in her left leg. 01/08/2023: One of the 3 remaining wounds is healed underneath the layer of eschar. The other 2 have contracted considerably. Light slough and eschar present. Edema control is good. Patient History Information obtained from Patient. Social History Never smoker, Marital Status - Widowed, Alcohol Use - Never, Drug Use - No History, Caffeine Use - Daily. Medical History Respiratory Patient has history of Asthma Cardiovascular Fannin, [name] ([id number]) [id number] Page 6 of 9 Patient has history of Hypertension Musculoskeletal Patient has history of Osteoarthritis Hospitalization/Surgery History - Bilateral Cataract Extraction;  Bone Graft-jaw; Arm Surgery (d/t dog bite). Medical A Surgical History Notes nd Eyes Hx: Cataracts Ear/Nose/Mouth/Throat Hx: wearing Bilateral hearing aids Genitourinary Chronic Renal Impairment Stage 3 Psychiatric Hx: Depression Objective Constitutional no acute distress. Vitals Time Taken: 11:28 AM, Height: 64 in, Weight: 180 lbs, BMI: 30.9, Temperature: 97.8 F, Pulse: 81 bpm, Respiratory Rate: 18 breaths/min, Blood Pressure: 122/73 mmHg. Respiratory Normal work of breathing on room air. General Notes: 01/08/2023: One of the 3 remaining wounds is healed underneath the layer of eschar. The other 2 have contracted considerably. Light slough and eschar present. Edema control is good. Integumentary (Hair, Skin) Wound #1 status is Open. Original cause of wound was Gradually Appeared. The date acquired was: 09/30/2022. The wound has been in treatment 10 weeks. The wound is located on the Right,Anterior Lower Leg. The wound measures 6cm length x 0.4cm width x 0.1cm depth; 1.885cm^2 area and 0.188cm^3 volume. There is Fat Layer (Subcutaneous Tissue) exposed. There is  no tunneling or undermining noted. There is a large amount of serosanguineous drainage noted. The wound margin is distinct with the outline attached to the wound base. There is medium (34-66%) red, hyper - granulation within the wound bed. There is a medium (34-66%) amount of necrotic tissue within the wound bed including Eschar and Adherent Slough. The periwound skin appearance exhibited: Scarring, Hemosiderin Staining. The periwound skin appearance did not exhibit: Excoriation, Dry/Scaly, Maceration. Periwound temperature was noted as No Abnormality. General Notes: clustered wound Assessment Active Problems ICD-10 Non-pressure chronic ulcer of other part of right lower leg with fat layer exposed Venous insufficiency (chronic) (peripheral) Other specified chronic obstructive pulmonary disease Peripheral vascular disease, unspecified Procedures Wound #1 Pre-procedure diagnosis of Wound #1 is a Venous Leg Ulcer located on the Right,Anterior Lower Leg .Severity of Tissue Pre Debridement is: Fat layer exposed. There was a Selective/Open Wound Non-Viable Tissue Debridement with a total area of 2.4 sq cm performed by Fredirick Maudlin, MD. With the following instrument(s): Curette to remove Non-Viable tissue/material. Material removed includes Eschar and Slough and after achieving pain control using Lidocaine 5% topical ointment. No specimens were taken. A time out was conducted at 11:45, prior to the start of the procedure. A Minimum amount of bleeding was controlled with Pressure. The procedure was tolerated well with a pain level of 0 throughout and a pain level of 0 following the procedure. Post Debridement Measurements: 6cm length x 0.4cm width x 0.1cm depth; 0.188cm^3 volume. Character of Wound/Ulcer Post Debridement is improved. Severity of Tissue Post Debridement is: Fat layer exposed. Post procedure Diagnosis Wound #1: Same as Pre-Procedure General Notes: Scribed for Dr. Celine Ahr by  J.Scotton. Pre-procedure diagnosis of Wound #1 is a Venous Leg Ulcer located on the Right,Anterior Lower Leg . There was a Three Layer Compression Therapy Procedure by Dellie Catholic, RN. Post procedure Diagnosis Wound #1: Same as Pre-Procedure Whitenight, [name] ([id number] Page 7 of 9 Plan Follow-up Appointments: Return Appointment in 1 week. - Dr. Celine Ahr - Room 3 Other: - Remember to pick up Triamcinolone Cream at the Pharmacy Anesthetic: (In clinic) Topical Lidocaine 5% applied to wound bed Bathing/ Shower/ Hygiene: Other Bathing/Shower/Hygiene Orders/Instructions: - May take a sponge bath Edema Control - Lymphedema / SCD / Other: Elevate legs to the level of the heart or above for 30 minutes daily and/or when sitting for 3-4 times a day throughout the day. Avoid standing for long periods of time. Exercise regularly Moisturize legs daily. - Use lotion on left leg-STOP using the steroid cream Non Wound  Condition: Other Non Wound Condition Orders/Instructions: - Place Triamcinolone Cream on lower left leg once a day as needed. WOUND #1: - Lower Leg Wound Laterality: Right, Anterior Cleanser: Soap and Water 1 x Per Week/30 Days Discharge Instructions: May shower and wash wound with dial antibacterial soap and water prior to dressing change. Cleanser: Wound Cleanser 1 x Per Week/30 Days Discharge Instructions: Cleanse the wound with wound cleanser prior to applying a clean dressing using gauze sponges, not tissue or cotton balls. Peri-Wound Care: Sween Lotion (Moisturizing lotion) 1 x Per Week/30 Days Discharge Instructions: Apply moisturizing lotion as directed Topical: Mupirocin Ointment 1 x Per Week/30 Days Discharge Instructions: Apply Mupirocin (Bactroban) as instructed Prim Dressing: Sorbalgon AG Dressing, 4x4 (in/in) 1 x Per Week/30 Days ary Discharge Instructions: Apply to wound bed as instructed Secondary Dressing: Woven Gauze Sponge,  Non-Sterile 4x4 in 1 x Per Week/30 Days Discharge Instructions: Apply over primary dressing as directed. Secondary Dressing: Zetuvit Plus 4x8 in 1 x Per Week/30 Days Discharge Instructions: Apply over primary dressing as directed. Com pression Wrap: ThreePress (3 layer compression wrap) 1 x Per Week/30 Days Discharge Instructions: Apply three layer compression as directed. 01/08/2023: One of the 3 remaining wounds is healed underneath the layer of eschar. The other 2 have contracted considerably. Light slough and eschar present. Edema control is good. I used a curette to debride slough and eschar from the remaining wounds. We will continue topical mupirocin, silver alginate, and 3 layer compression. Follow- up in 1 week. Electronic Signature(s) Signed: 01/08/2023 11:57:46 AM By: Fredirick Maudlin MD FACS Entered By: Fredirick Maudlin on 01/08/2023 11:57:46 -------------------------------------------------------------------------------- HxROS Details Patient Name: Date of Service: PA DDISO [name] 01/08/2023 11:00 A M Medical Record Number: [id number] Patient Account Number: [id number] Date of Birth/Sex: Treating RN: [birth year] (82 y.o. F) Primary Care Provider: Terrill Mohr Other Clinician: Referring Provider: Treating Provider/Extender: Renaldo Harrison in Treatment: 10 Information Obtained From Patient Eyes Medical History: Past Medical History Notes: Hx: Cataracts Ear/Nose/Mouth/Throat Medical History: Past Medical History Notes: Hx: wearing Bilateral hearing aids Cassity, [name] ([id number]) [id number] Page 8 of 9 Respiratory Medical History: Positive for: Asthma Cardiovascular Medical History: Positive for: Hypertension Genitourinary Medical History: Past Medical History Notes: Chronic Renal Impairment Stage 3 Musculoskeletal Medical History: Positive for: Osteoarthritis Psychiatric Medical History: Past Medical  History Notes: Hx: Depression Immunizations Pneumococcal Vaccine: Received Pneumococcal Vaccination: Yes Received Pneumococcal Vaccination On or After 60th Birthday: Yes Implantable Devices None Hospitalization / Surgery History Type of Hospitalization/Surgery Bilateral Cataract Extraction; Bone Graft-jaw; Arm Surgery (d/t dog bite) Family and Social History Never smoker; Marital Status - Widowed; Alcohol Use: Never; Drug Use: No History; Caffeine Use: Daily; Financial Concerns: No; Food, Clothing or Shelter Needs: No; Support System Lacking: No; Transportation Concerns: No Electronic Signature(s) Signed: 01/08/2023 12:30:57 PM By: Fredirick Maudlin MD FACS Entered By: Fredirick Maudlin on 01/08/2023 11:56:49 -------------------------------------------------------------------------------- SuperBill Details Patient Name: Date of Service: PA DDISO [name] 01/08/2023 Medical Record Number: [id number] Patient Account Number: [id number] Date of Birth/Sex: Treating RN: 19-Dec-1940 (81 y.o. F) Primary Care Provider: Terrill Mohr Other Clinician: Referring Provider: Treating Provider/Extender: Serita Kyle Weeks in Treatment: 10 Diagnosis Coding ICD-10 Codes Code Description [phone] Non-pressure chronic ulcer of other part of right lower leg with fat layer exposed I87.2 Venous insufficiency (chronic) (peripheral) J44.89 Other specified chronic obstructive pulmonary disease Sanon, Geniyah ([id number]) [id number] Page 9 of 9 I73.9 Peripheral vascular disease, unspecified Facility Procedures : CPT4 Code: [id number] Description: [phone] - DEBRIDE WOUND 1ST  20 SQ CM OR < ICD-10 Diagnosis Description [id number] Non-pressure chronic ulcer of other part of right lower leg with fat layer expos Modifier: ed Quantity: 1 Physician Procedures : CPT4 Code Description Modifier [id number] - WC PHYS LEVEL 4 - EST PT 25 ICD-10 Diagnosis Description  L97.812 Non-pressure chronic ulcer of other part of right lower leg with fat layer exposed I87.2 Venous insufficiency (chronic) (peripheral) J44.89  Other specified chronic obstructive pulmonary disease I73.9 Peripheral vascular disease, unspecified Quantity: 1 : [id number] - WC PHYS DEBR WO ANESTH 20 SQ CM ICD-10 Diagnosis Description L97.812 Non-pressure chronic ulcer of other part of right lower leg with fat layer exposed Quantity: 1 Electronic Signature(s) Signed: 01/08/2023 11:58:03 AM By: Fredirick Maudlin MD FACS Entered By: Fredirick Maudlin on 01/08/2023 11:58:03

## 2023-01-15 ENCOUNTER — Encounter (HOSPITAL_BASED_OUTPATIENT_CLINIC_OR_DEPARTMENT_OTHER): Payer: Medicare Other | Admitting: General Surgery

## 2023-01-15 DIAGNOSIS — L97812 Non-pressure chronic ulcer of other part of right lower leg with fat layer exposed: Secondary | ICD-10-CM | POA: Diagnosis not present

## 2023-01-16 NOTE — Progress Notes (Signed)
[name] ([id number] Page 1 of 7 Visit Report for 01/15/2023 Chief Complaint Document Details Patient Name: Date of Service: [name] [facility] 01/15/2023 11:00 A M Medical Record Number: [id number] Patient Account Number: [id number] Date of Birth/Sex: Treating RN: 09/11/1941 (82 y.o. F) Primary Care Provider: Terrill Mohr Other Clinician: Referring Provider: Treating Provider/Extender: Renaldo Harrison in Treatment: 11 Information Obtained from: Patient Chief Complaint Patient presents for treatment of open ulcers due to venous insufficiency Electronic Signature(s) Signed: 01/15/2023 12:13:59 PM By: Fredirick Maudlin MD FACS Entered By: Fredirick Maudlin on 01/15/2023 12:13:59 -------------------------------------------------------------------------------- HPI Details Patient Name: Date of Service: [name] 01/15/2023 11:00 A M Medical Record Number: [id number] Patient Account Number: [id number] Date of Birth/Sex: Treating RN: 02/03/1941 (82 y.o. 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 The patient and her family elected for comfort care however somehow she managed to heal her wounds. From the electronic medical record, it looks like she was receiving home health services and being followed by her primary care provider. At the beginning of November, she saw her PCP again and had new ulcers on her right leg. Home health services were not available to assist the patient  and she was referred to the wound care center for further evaluation and management. On the right anterior lower leg, she has multiple ulcers of varying degrees and depths. Some are limited to breakdown of skin and others are deeper, into the fat layer. There is slough and some eschar accumulation. She has periwound erythema and 1-2+ edema. 12/14-second visit for this woman who arrived to clinic accompanied by a neighbor. 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 She tells me that in 2021 she had bilateral lower leg wounds which took an ordinarily long period of time to healo 2 years. The current wounds have been on the right leg for about 2 months. She may or may not have been wearing a support stocking on the right leg. She has not been using anything on the left but fortunately does not have any wounds there. She makes it clear to me that she cannot get standard over the toe stockings on herself. Her husband passed away sometime earlier this year I believe 11/13/2022: All of the wounds seem to have progressed, getting larger and at this point and the fat layer is exposed in all locations. There is slough accumulation on all surfaces. 11/20/2022: Her wounds continue to worsen. They have expanded and there is thick slough on all of the wound surfaces. She is experiencing more pain. 11/28/2022: The cultures that I took last week grew out methicillin-resistant Staph aureus so we discontinued Augmentin and initiated doxycycline. She is currently taking this. Her wounds are less painful today. They are all about the same size, but they have not expanded. There is rubbery slough on the wound surfaces. 12/04/2022: The wounds look improved today after being on doxycycline and using topical mupirocin. There is still a fair amount of slough accumulation. 12/11/2022: The wounds are smaller again this week. They continue to accumulate some slough and remain tender. She has completed the  oral doxycycline. [name] ([id number] Page 2 of 7 12/18/2022: The wounds continue to contract. A couple of the smaller ones have scabbed over. There is slough and eschar accumulation. They are less tender than on prior occasions. 12/25/2022: The wounds are all much smaller. A number of them have healed under some eschar. There is still some slough accumulation and perimeter eschar present. She states she is no longer having any pain. 01/01/2023: She is down to just 3 remaining wounds. They are all smaller with just a little bit of slough and eschar. She is concerned about the redness and inflammation in her left leg. 01/08/2023: One of the 3 remaining wounds is healed underneath the layer of eschar. The other 2 have contracted considerably. Light slough and eschar present. Edema control is good. 01/15/2023: Her wounds are healed. Electronic Signature(s) Signed: 01/15/2023 12:14:15 PM By: Fredirick Maudlin MD FACS Entered By: Fredirick Maudlin on 01/15/2023 12:14:15 -------------------------------------------------------------------------------- Physical Exam Details Patient Name: Date of Service: [name] 01/15/2023 11:00 A M Medical Record Number: [id number] Patient Account Number: [id number] Date of Birth/Sex: Treating RN: Mar 24, 1941 (82 y.o. F) Primary Care Provider: Terrill Mohr Other Clinician: Referring Provider: Treating Provider/Extender: Serita Kyle Weeks in Treatment: 11 Constitutional Hypertensive, asymptomatic. . . . no acute distress. Respiratory Normal work of breathing on room air. Notes 01/15/2023: Her wounds are healed. Electronic Signature(s) Signed: 01/15/2023 12:15:33 PM By: Fredirick Maudlin MD FACS Entered By: Fredirick Maudlin on 01/15/2023 12:15:33 -------------------------------------------------------------------------------- Physician Orders Details Patient Name: Date of Service: PA  DDISO [name] 01/15/2023 11:00 A M Medical Record Number: [id number] Patient Account Number: [id number] Date of Birth/Sex: Treating RN: 05-Nov-1941 (82 y.o. [name] Primary Care Provider: Terrill Mohr Other Clinician: Referring Provider: Treating Provider/Extender: Renaldo Harrison in Treatment: 11 Verbal / Phone Orders: No Diagnosis Coding ICD-10 Coding Code Description [phone] Non-pressure chronic ulcer of other part of right lower leg with fat layer exposed I87.2 Venous insufficiency (chronic) (peripheral) J44.89 Other specified chronic obstructive pulmonary disease I73.9 Peripheral vascular disease, unspecified [name], [name] ([id number] Page 3 of 7 Discharge From [facility] Discharge from [place] your wound is healed! Electronic Signature(s) Signed: 01/15/2023 12:15:42 PM By: Fredirick Maudlin MD FACS Entered By: Fredirick Maudlin on 01/15/2023 12:15:42 -------------------------------------------------------------------------------- Problem List Details Patient Name: Date of Service: [name] 01/15/2023 11:00 A M Medical Record Number: [id number] Patient Account Number: [id number] Date of Birth/Sex: Treating RN: 11/10/1941 (82 y.o. F) Primary Care Provider: Terrill Mohr Other Clinician: Referring Provider: Treating Provider/Extender: Serita Kyle Weeks in Treatment: 11 Active Problems ICD-10 Encounter Code Description Active Date MDM Diagnosis L97.812 Non-pressure chronic ulcer of other part of right lower leg with fat layer 10/30/2022 No Yes exposed I87.2 Venous insufficiency (chronic) (peripheral) 10/30/2022 No Yes J44.89 Other specified chronic obstructive pulmonary disease 10/30/2022 No Yes I73.9 Peripheral vascular disease, unspecified 10/30/2022 No Yes Inactive Problems Resolved Problems Electronic Signature(s) Signed: 01/15/2023  12:13:48 PM By: Fredirick Maudlin MD FACS Entered By: Fredirick Maudlin on 01/15/2023 12:13:48 -------------------------------------------------------------------------------- Progress Note Details Patient Name: Date of Service: [name] 01/15/2023 11:00 A M Medical Record Number: [id number] Patient Account Number: [id number] Date of Birth/Sex: Treating RN: [birth year] (82 y.o. 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 The patient and her family elected for comfort care however somehow she managed to heal her wounds. From the electronic medical record, it looks like she was receiving home health services and being followed by her primary care provider. At the beginning of November, she saw her PCP again and had new ulcers on her right leg. Home health services were not available to assist the patient and she was referred to the wound care center for further evaluation and management. On the right anterior lower leg, she has multiple ulcers of varying degrees and depths. Some are limited to breakdown of skin and others are deeper, into the fat layer. There is slough and some eschar accumulation. She has periwound erythema and 1-2+ edema. 12/14-second visit for this woman who arrived to clinic accompanied by a neighbor. She has venous related  ulcerations of her right anterior lower leg. She tells me that in 2021 she had bilateral lower leg wounds which took an ordinarily long period of time to healo 2 years. The current wounds have been on the right leg for about 2 months. She may or may not have been wearing a support stocking on the right leg. She has not been using anything on the left but fortunately does not have any wounds there. She makes it clear to me that she cannot get standard over the toe stockings on herself. Her husband passed away sometime earlier this year I believe 11/13/2022: All of the wounds seem to have progressed, getting larger and at this point and the fat layer is exposed in all locations. There is slough accumulation on all surfaces. 11/20/2022: Her wounds continue to worsen. They have expanded and there is thick slough on all of the wound surfaces. She is experiencing more pain. 11/28/2022: The cultures that I took last week grew out methicillin-resistant Staph aureus so we discontinued Augmentin and initiated doxycycline. She is currently taking this. Her wounds are less painful today. They are all about the same size, but they have not expanded. There is rubbery slough on the wound surfaces. 12/04/2022: The wounds look improved today after being on doxycycline and using topical mupirocin. There is still a fair amount of slough accumulation. 12/11/2022: The wounds are smaller again this week. They continue to accumulate some slough and remain tender. She has completed the oral doxycycline. 12/18/2022: The wounds continue to contract. A couple of the smaller ones have scabbed over. There is slough and eschar accumulation. They are less tender than on prior occasions. 12/25/2022: The wounds are all much smaller. A number of them have healed under some eschar. There is still some slough accumulation and perimeter eschar present. She states she is no longer having any pain. 01/01/2023: She is down to just 3 remaining  wounds. They are all smaller with just a little bit of slough and eschar. She is concerned about the redness and inflammation in her left leg. 01/08/2023: One of the 3 remaining wounds is healed underneath the layer of eschar. The other 2 have contracted considerably. Light slough and eschar present. Edema control is good. 01/15/2023: Her wounds are healed. Patient History Information obtained from Patient. Social History Never smoker, Marital Status - Widowed,  Alcohol Use - Never, Drug Use - No History, Caffeine Use - Daily. Medical History Respiratory Patient has history of Asthma Cardiovascular Patient has history of Hypertension Musculoskeletal Patient has history of Osteoarthritis Hospitalization/Surgery History - Bilateral Cataract Extraction; Bone Graft-jaw; Arm Surgery (d/t dog bite). Medical A Surgical History Notes nd Eyes Hx: Cataracts Ear/Nose/Mouth/Throat Hx: wearing Bilateral hearing aids Genitourinary Chronic Renal Impairment Stage 3 Psychiatric Hx: Depression Objective [name], [name] ([id number] Page 5 of 7 Constitutional Hypertensive, asymptomatic. no acute distress. Vitals Time Taken: 11:18 AM, Height: 64 in, Weight: 180 lbs, BMI: 30.9, Temperature: 97.8 F, Pulse: 88 bpm, Respiratory Rate: 16 breaths/min, Blood Pressure: 163/86 mmHg. Respiratory Normal work of breathing on room air. General Notes: 01/15/2023: Her wounds are healed. Integumentary (Hair, Skin) Wound #1 status is Healed - Epithelialized. Original cause of wound was Gradually Appeared. The date acquired was: 09/30/2022. The wound has been in treatment 11 weeks. The wound is located on the Right,Anterior Lower Leg. The wound measures 0cm length x 0cm width x 0cm depth; 0cm^2 area and 0cm^3 volume. There is no tunneling or undermining noted. There is a none present amount of drainage noted. The wound margin is distinct with the outline attached to the wound  base. There is no granulation within the wound bed. There is no necrotic tissue within the wound bed. The periwound skin appearance had no abnormalities noted for texture. The periwound skin appearance had no abnormalities noted for moisture. The periwound skin appearance had no abnormalities noted for color. Periwound temperature was noted as No Abnormality. Assessment Active Problems ICD-10 Non-pressure chronic ulcer of other part of right lower leg with fat layer exposed Venous insufficiency (chronic) (peripheral) Other specified chronic obstructive pulmonary disease Peripheral vascular disease, unspecified Plan [facility]charge From [facility]: Discharge from [place] your wound is healed! 01/15/2023: Her wounds are healed. I recommended that she wear bilateral compression stockings and be cognizant of maintaining leg elevation is much as possible throughout the day and at night when she sleeps. At this time, she has no further wound care clinic needs and she will be discharged. She may follow- up as needed. Electronic Signature(s) Signed: 01/15/2023 12:19:23 PM By: Fredirick Maudlin MD FACS Entered By: Fredirick Maudlin on 01/15/2023 12:19:23 -------------------------------------------------------------------------------- HxROS Details Patient Name: Date of Service: [name] 01/15/2023 11:00 A M Medical Record Number: [id number] Patient Account Number: [id number] Date of Birth/Sex: Treating RN: [birth year] (82 y.o. F) Primary Care Provider: Terrill Mohr Other Clinician: Referring Provider: Treating Provider/Extender: Renaldo Harrison in Treatment: 11 Information Obtained From Patient Eyes Medical History: Past Medical History Notes: Hx: Cataracts [name], [name] ([id number]) [id number] Page 6 of 7 Ear/Nose/Mouth/Throat Medical History: Past Medical History Notes: Hx: wearing Bilateral hearing  aids Respiratory Medical History: Positive for: Asthma Cardiovascular Medical History: Positive for: Hypertension Genitourinary Medical History: Past Medical History Notes: Chronic Renal Impairment Stage 3 Musculoskeletal Medical History: Positive for: Osteoarthritis Psychiatric Medical History: Past Medical History Notes: Hx: Depression Immunizations Pneumococcal Vaccine: Received Pneumococcal Vaccination: Yes Received Pneumococcal Vaccination On or After 60th Birthday: Yes Implantable Devices None Hospitalization / Surgery History Type of Hospitalization/Surgery Bilateral Cataract Extraction; Bone Graft-jaw; Arm Surgery (d/t dog bite) Family and Social History Never smoker; Marital Status - Widowed; Alcohol Use: Never; Drug Use: No History; Caffeine Use: Daily; Financial Concerns: No; Food, Clothing or Shelter Needs: No; Support System Lacking: No; Transportation Concerns: No Engineer, maintenance) Signed: 01/15/2023 12:40:26 PM By: Fredirick Maudlin MD FACS Entered By: Fredirick Maudlin  on 01/15/2023 12:15:05 -------------------------------------------------------------------------------- SuperBill Details Patient Name: Date of Service: [name] [facility] 01/15/2023 Medical Record Number: [id number] Patient Account Number: [id number] Date of Birth/Sex: Treating RN: [birth year] (82 y.o. F) Primary Care Provider: Terrill Mohr Other Clinician: Referring Provider: Treating Provider/Extender: Serita Kyle Weeks in Treatment: 11 Diagnosis Coding ICD-10 Codes Code Description Donner, [name] ([id number]) [id number] Page 7 of 7 [phone] Non-pressure chronic ulcer of other part of right lower leg with fat layer exposed I87.2 Venous insufficiency (chronic) (peripheral) J44.89 Other specified chronic obstructive pulmonary disease I73.9 Peripheral vascular disease, unspecified Facility Procedures : CPT4 Code:  [id number] Description: 99213 - WOUND CARE VISIT-LEV 3 EST PT Modifier: Quantity: 1 Physician Procedures : CPT4 Code Description Modifier [id number] - WC PHYS LEVEL 2 - EST PT ICD-10 Diagnosis Description [id number] Non-pressure chronic ulcer of other part of right lower leg with fat layer exposed I87.2 Venous insufficiency (chronic) (peripheral) J44.89  Other specified chronic obstructive pulmonary disease I73.9 Peripheral vascular disease, unspecified Quantity: 1 Electronic Signature(s) Signed: 01/15/2023 5:16:53 PM By: Dellie Catholic RN Signed: 01/16/2023 9:04:37 AM By: Fredirick Maudlin MD FACS Previous Signature: 01/15/2023 12:19:39 PM Version By: Fredirick Maudlin MD FACS Entered By: Dellie Catholic on 01/15/2023 17:13:07

## 2023-01-16 NOTE — Progress Notes (Signed)
[name] ([id number] Page 1 of 8 Visit Report for 01/15/2023 Arrival Information Details Patient Name: Date of Service: [name] [facility] 01/15/2023 11:00 A M Medical Record Number: [id number] Patient Account Number: [id number] Date of Birth/Sex: Treating RN: 04-11-1941 (82 y.o. [name] Primary Care Sky Primo: Terrill Mohr Other Clinician: Referring Hannah Strader: Treating Leoncio Hansen/Extender: Renaldo Harrison in Treatment: 11 Visit Information History Since Last Visit Added or deleted any medications: No Patient Arrived: [name] Any new allergies or adverse reactions: No Arrival Time: 11:14 Had a fall or experienced change in No Accompanied By: "friend" activities of daily living that may affect Transfer Assistance: None risk of falls: Signs or symptoms of abuse/neglect since last visito No Hospitalized since last visit: No Implantable device outside of the clinic excluding No cellular tissue based products placed in the center since last visit: Has Dressing in Place as Prescribed: Yes Pain Present Now: Yes Electronic Signature(s) Signed: 01/15/2023 5:16:53 PM By: Dellie Catholic RN Entered By: Dellie Catholic on 01/15/2023 11:29:43 -------------------------------------------------------------------------------- Clinic Level of Care Assessment Details Patient Name: Date of Service: [name] 01/15/2023 11:00 A M Medical Record Number: [id number] Patient Account Number: [id number] Date of Birth/Sex: Treating RN: 1940/12/01 (82 y.o. [name] Primary Care [name]: Terrill Mohr Other Clinician: Referring Markeesha Char: Treating Zakari Bathe/Extender: Renaldo Harrison in Treatment: 11 Clinic Level of Care Assessment Items TOOL 4 Quantity Score X- 1 0 Use when only an EandM is performed on FOLLOW-UP visit ASSESSMENTS - Nursing Assessment / Reassessment X- 1  10 Reassessment of Co-morbidities (includes updates in patient status) X- 1 5 Reassessment of Adherence to Treatment Plan ASSESSMENTS - Wound and Skin A ssessment / Reassessment X - Simple Wound Assessment / Reassessment - one wound 1 5 '[]'$  - 0 Complex Wound Assessment / Reassessment - multiple wounds '[]'$  - 0 Dermatologic / Skin Assessment (not related to wound area) ASSESSMENTS - Focused Assessment '[]'$  - 0 Circumferential Edema Measurements - multi extremities '[]'$  - 0 Nutritional Assessment / Counseling / Intervention [name], [name] ([id number] Page 2 of 8 '[]'$  - 0 Lower Extremity Assessment (monofilament, tuning fork, pulses) '[]'$  - 0 Peripheral Arterial Disease Assessment (using hand held doppler) ASSESSMENTS - Ostomy and/or Continence Assessment and Care '[]'$  - 0 Incontinence Assessment and Management '[]'$  - 0 Ostomy Care Assessment and Management (repouching, etc.) PROCESS - Coordination of Care X - Simple Patient / Family Education for ongoing care 1 15 '[]'$  - 0 Complex (extensive) Patient / Family Education for ongoing care X- 1 10 Staff obtains Programmer, systems, Records, T Results / Process Orders est X- 1 10 Staff telephones HHA, Nursing Homes / Clarify orders / etc '[]'$  - 0 Routine Transfer to another Facility (non-emergent condition) '[]'$  - 0 Routine Hospital Admission (non-emergent condition) '[]'$  - 0 New Admissions / Biomedical engineer / Ordering NPWT Apligraf, etc. , '[]'$  - 0 Emergency Hospital Admission (emergent condition) X- 1 10 Simple Discharge Coordination '[]'$  - 0 Complex (extensive) Discharge Coordination PROCESS - Special Needs '[]'$  - 0 Pediatric / Minor Patient Management '[]'$  - 0 Isolation Patient Management '[]'$  - 0 Hearing / Language / Visual special needs '[]'$  - 0 Assessment of Community assistance (transportation, D/C planning, etc.) '[]'$  - 0 Additional assistance / Altered mentation '[]'$  - 0 Support Surface(s) Assessment (bed,  cushion, seat, etc.) INTERVENTIONS - Wound Cleansing / Measurement X - Simple Wound Cleansing - one wound 1 5 '[]'$  - 0 Complex Wound Cleansing - multiple wounds X- 1 5  Wound Imaging (photographs - any number of wounds) '[]'$  - 0 Wound Tracing (instead of photographs) X- 1 5 Simple Wound Measurement - one wound '[]'$  - 0 Complex Wound Measurement - multiple wounds INTERVENTIONS - Wound Dressings '[]'$  - 0 Small Wound Dressing one or multiple wounds '[]'$  - 0 Medium Wound Dressing one or multiple wounds '[]'$  - 0 Large Wound Dressing one or multiple wounds '[]'$  - 0 Application of Medications - topical '[]'$  - 0 Application of Medications - injection INTERVENTIONS - Miscellaneous '[]'$  - 0 External ear exam '[]'$  - 0 Specimen Collection (cultures, biopsies, blood, body fluids, etc.) '[]'$  - 0 Specimen(s) / Culture(s) sent or taken to Lab for analysis '[]'$  - 0 Patient Transfer (multiple staff / Civil Service fast streamer / Similar devices) '[]'$  - 0 Simple Staple / Suture removal (25 or less) '[]'$  - 0 Complex Staple / Suture removal (26 or more) '[]'$  - 0 Hypo / Hyperglycemic Management (close monitor of Blood Glucose) [name] ([id number] Page 3 of 8 '[]'$  - 0 Ankle / Brachial Index (ABI) - do not check if billed separately X- 1 5 Vital Signs Has the patient been seen at the hospital within the last three years: Yes Total Score: 85 Level Of Care: New/Established - Level 3 Electronic Signature(s) Signed: 01/15/2023 5:16:53 PM By: Dellie Catholic RN Entered By: Dellie Catholic on 01/15/2023 17:12:56 -------------------------------------------------------------------------------- Encounter Discharge Information Details Patient Name: Date of Service: [name] 01/15/2023 11:00 A M Medical Record Number: [id number] Patient Account Number: [id number] Date of Birth/Sex: Treating RN: Nov 03, 1941 (82 y.o. [name] Primary Care Arissa Fagin: Terrill Mohr Other  Clinician: Referring Lilliana Turner: Treating Efrata Brunner/Extender: Renaldo Harrison in Treatment: 11 Encounter Discharge Information Items Discharge Condition: Stable Ambulatory Status: Cane Discharge Destination: Home Transportation: Private Auto Accompanied By: friend Schedule Follow-up Appointment: Yes Clinical Summary of Care: Patient Declined Electronic Signature(s) Signed: 01/15/2023 5:16:53 PM By: Dellie Catholic RN Entered By: Dellie Catholic on 01/15/2023 17:13:38 -------------------------------------------------------------------------------- Lower Extremity Assessment Details Patient Name: Date of Service: [name] 01/15/2023 11:00 A M Medical Record Number: [id number] Patient Account Number: [id number] Date of Birth/Sex: Treating RN: [birth year] (82 y.o. [name] Primary Care Anav Lammert: Terrill Mohr Other Clinician: Referring Adell Koval: Treating Vendela Troung/Extender: Serita Kyle Weeks in Treatment: 11 Edema Assessment Assessed: [Left: No] Patrice Paradise: No] [Left: Edema] [Right: :] Calf Left: Right: Point of Measurement: 36 cm From Medial Instep 44 cm 37 cm Ankle Left: Right: Point of Measurement: 10 cm From Medial Instep 26 cm 24 cm Vascular Assessment Offner, Karlena ([id number]) [[id number] Page 4 of 8] Pulses: Dorsalis Pedis Palpable: [Right:Yes] Electronic Signature(s) Signed: 01/15/2023 5:16:53 PM By: Dellie Catholic RN Entered By: Dellie Catholic on 01/15/2023 11:35:50 -------------------------------------------------------------------------------- Multi Wound Chart Details Patient Name: Date of Service: [name] 01/15/2023 11:00 A M Medical Record Number: [id number] Patient Account Number: [id number] Date of Birth/Sex: Treating RN: 10/30/1941 (82 y.o. F) Primary Care Darin Arndt: Terrill Mohr Other Clinician: Referring Jarek Longton: Treating Adiva Boettner/Extender:  Serita Kyle Weeks in Treatment: 11 Vital Signs Height(in): 64 Pulse(bpm): 88 Weight(lbs): 180 Blood Pressure(mmHg): 163/86 Body Mass Index(BMI): 30.9 Temperature(F): 97.8 Respiratory Rate(breaths/min): 16 [1:Photos:] [N/A:N/A] Right, Anterior Lower Leg N/A N/A Wound Location: Gradually Appeared N/A N/A Wounding Event: Venous Leg Ulcer N/A N/A Primary Etiology: Asthma, Hypertension, Osteoarthritis N/A N/A Comorbid History: 09/30/2022 N/A N/A Date Acquired: 11 N/A N/A Weeks of Treatment: Healed - Epithelialized N/A N/A Wound Status: No N/A N/A Wound Recurrence: Yes N/A N/A Clustered  Wound: 0x0x0 N/A N/A Measurements L x W x D (cm) 0 N/A N/A A (cm) : rea 0 N/A N/A Volume (cm) : 100.00% N/A N/A % Reduction in A rea: 100.00% N/A N/A % Reduction in Volume: Full Thickness Without Exposed N/A N/A Classification: Support Structures None Present N/A N/A Exudate Amount: Distinct, outline attached N/A N/A Wound Margin: None Present (0%) N/A N/A Granulation Amount: None Present (0%) N/A N/A Necrotic Amount: Fascia: No N/A N/A Exposed Structures: Fat Layer (Subcutaneous Tissue): No Tendon: No Muscle: No Joint: No Bone: No Large (67-100%) N/A N/A Epithelialization: Scarring: Yes N/A N/A Periwound Skin Texture: Excoriation: No Maceration: No N/A N/A Periwound Skin Moisture: Dry/Scaly: No Hemosiderin Staining: Yes N/A N/A Periwound Skin Color: No Abnormality N/A N/A Temperature: [place], Symphony ([id number] Page 5 of 8 Treatment Notes Electronic Signature(s) Signed: 01/15/2023 12:13:53 PM By: Fredirick Maudlin MD FACS Entered By: Fredirick Maudlin on 01/15/2023 12:13:53 -------------------------------------------------------------------------------- Multi-Disciplinary Care Plan Details Patient Name: Date of Service: [name] 01/15/2023 11:00 A M Medical Record Number: [id number] Patient  Account Number: [id number] Date of Birth/Sex: Treating RN: [birth year] (82 y.o. [name] Primary Care Makaiyah Schweiger: Terrill Mohr Other Clinician: Referring Madelynn Malson: Treating Maudy Yonan/Extender: Serita Kyle Weeks in Treatment: 11 Active Inactive Electronic Signature(s) Signed: 01/15/2023 5:16:53 PM By: Dellie Catholic RN Entered By: Dellie Catholic on 01/15/2023 17:13:54 -------------------------------------------------------------------------------- Pain Assessment Details Patient Name: Date of Service: [name] 01/15/2023 11:00 A M Medical Record Number: [id number] Patient Account Number: [id number] Date of Birth/Sex: Treating RN: 1941/11/07 (82 y.o. [name] Primary Care Carlisha Wisler: Terrill Mohr Other Clinician: Referring Regie Bunner: Treating Deepika Decatur/Extender: Serita Kyle Weeks in Treatment: 11 Active Problems Location of Pain Severity and Description of Pain Patient Has Paino Yes Site Locations Pain Location: Generalized Pain With Dressing Change: Yes Duration of the Pain. Constant / Intermittento Constant Rate the pain. Current Pain Level: 5 Worst Pain Level: 10 Least Pain Level: 4 Tolerable Pain Level: 6 Character of Pain Describe the Pain: Difficult to Pinpoint Sackmann, [name] ([id number]) [id number].pdf Page 6 of 8 Pain Management and Medication Current Pain Management: Medication: No Cold Application: No Rest: No Massage: No Activity: No T.E.N.S.: No Heat Application: No Leg drop or elevation: No Is the Current Pain Management Adequate: Adequate How does your wound impact your activities of daily livingo Sleep: No Bathing: No Appetite: No Relationship With Others: No Bladder Continence: No Emotions: No Bowel Continence: No Work: No Toileting: No Drive: No Dressing: No Hobbies: No Electronic Signature(s) Signed: 01/15/2023 5:16:53 PM By: Dellie Catholic  RN Entered By: Dellie Catholic on 01/15/2023 11:35:38 -------------------------------------------------------------------------------- Patient/Caregiver Education Details Patient Name: Date of Service: [name] 2/22/2024andnbsp11:00 A M Medical Record Number: [id number] Patient Account Number: [id number] Date of Birth/Gender: Treating RN: Sep 30, 1941 (82 y.o. [name] Primary Care Physician: Terrill Mohr Other Clinician: Referring Physician: Treating Physician/Extender: Renaldo Harrison in Treatment: 11 Education Assessment Education Provided To: Patient Education Topics Provided Wound/Skin Impairment: Methods: Explain/Verbal Responses: Return demonstration correctly Electronic Signature(s) Signed: 01/15/2023 5:16:53 PM By: Dellie Catholic RN Entered By: Dellie Catholic on 01/15/2023 17:11:34 -------------------------------------------------------------------------------- Wound Assessment Details Patient Name: Date of Service: [name] 01/15/2023 11:00 A M Medical Record Number: [id number] Patient Account Number: [id number] Date of Birth/Sex: Treating RN: Apr 27, 1941 (82 y.o. [name] Primary Care Acie Custis: Terrill Mohr Other Clinician: Referring Salvador Bigbee: Treating Anatole Apollo/Extender: Serita Kyle Weeks in Treatment: 11 Wound Status Wound Number: 1  Primary Etiology: Venous Leg Ulcer Garno, Louana ([id number] Page 7 of 8 Wound Location: Right, Anterior Lower Leg Wound Status: Healed - Epithelialized Wounding Event: Gradually Appeared Comorbid History: Asthma, Hypertension, Osteoarthritis Date Acquired: 09/30/2022 Weeks Of Treatment: 11 Clustered Wound: Yes Photos Wound Measurements Length: (cm) Width: (cm) Depth: (cm) Area: (cm) Volume: (cm) 0 % Reduction in Area: 100% 0 % Reduction in Volume: 100% 0 Epithelialization: Large (67-100%) 0  Tunneling: No 0 Undermining: No Wound Description Classification: Full Thickness Without Exposed Support Structures Wound Margin: Distinct, outline attached Exudate Amount: None Present Foul Odor After Cleansing: No Slough/Fibrino No Wound Bed Granulation Amount: None Present (0%) Exposed Structure Necrotic Amount: None Present (0%) Fascia Exposed: No Fat Layer (Subcutaneous Tissue) Exposed: No Tendon Exposed: No Muscle Exposed: No Joint Exposed: No Bone Exposed: No Periwound Skin Texture Texture Color No Abnormalities Noted: Yes No Abnormalities Noted: Yes Moisture Temperature / Pain No Abnormalities Noted: Yes Temperature: No Abnormality Electronic Signature(s) Signed: 01/15/2023 5:16:53 PM By: Dellie Catholic RN Entered By: Dellie Catholic on 01/15/2023 11:36:31 -------------------------------------------------------------------------------- Vitals Details Patient Name: Date of Service: [name] 01/15/2023 11:00 A M Medical Record Number: [id number] Patient Account Number: [id number] Date of Birth/Sex: Treating RN: [birth year] (82 y.o. [name] Primary Care Leshawn Straka: Terrill Mohr Other Clinician: Referring Merced Hanners: Treating Ameerah Huffstetler/Extender: Serita Kyle Weeks in Treatment: 11 Vital Signs Time Taken: 11:18 Temperature (F): 97.8 Height (in): 64 Pulse (bpm): 88 Weight (lbs): 180 Respiratory Rate (breaths/min): 16 Aho, Louanne ([id number]) [id number] Page 8 of 8 Body Mass Index (BMI): 30.9 Blood Pressure (mmHg): 163/86 Reference Range: 80 - 120 mg / dl Electronic Signature(s) Signed: 01/15/2023 5:16:53 PM By: Dellie Catholic RN Entered By: Dellie Catholic on 01/15/2023 11:30:07

## 2023-01-22 ENCOUNTER — Ambulatory Visit (HOSPITAL_BASED_OUTPATIENT_CLINIC_OR_DEPARTMENT_OTHER): Payer: Medicare Other | Admitting: General Surgery

## 2023-06-01 IMAGING — CR DG ANKLE COMPLETE 3+V*L*
3 series · 3 of 3 positions shown · non-contrast
Comparison: Tibia/fibular radiographs 06/24/2021

CLINICAL DATA: Wound.

EXAM:
LEFT ANKLE COMPLETE - 3+ VIEW

[x ankle lat left]
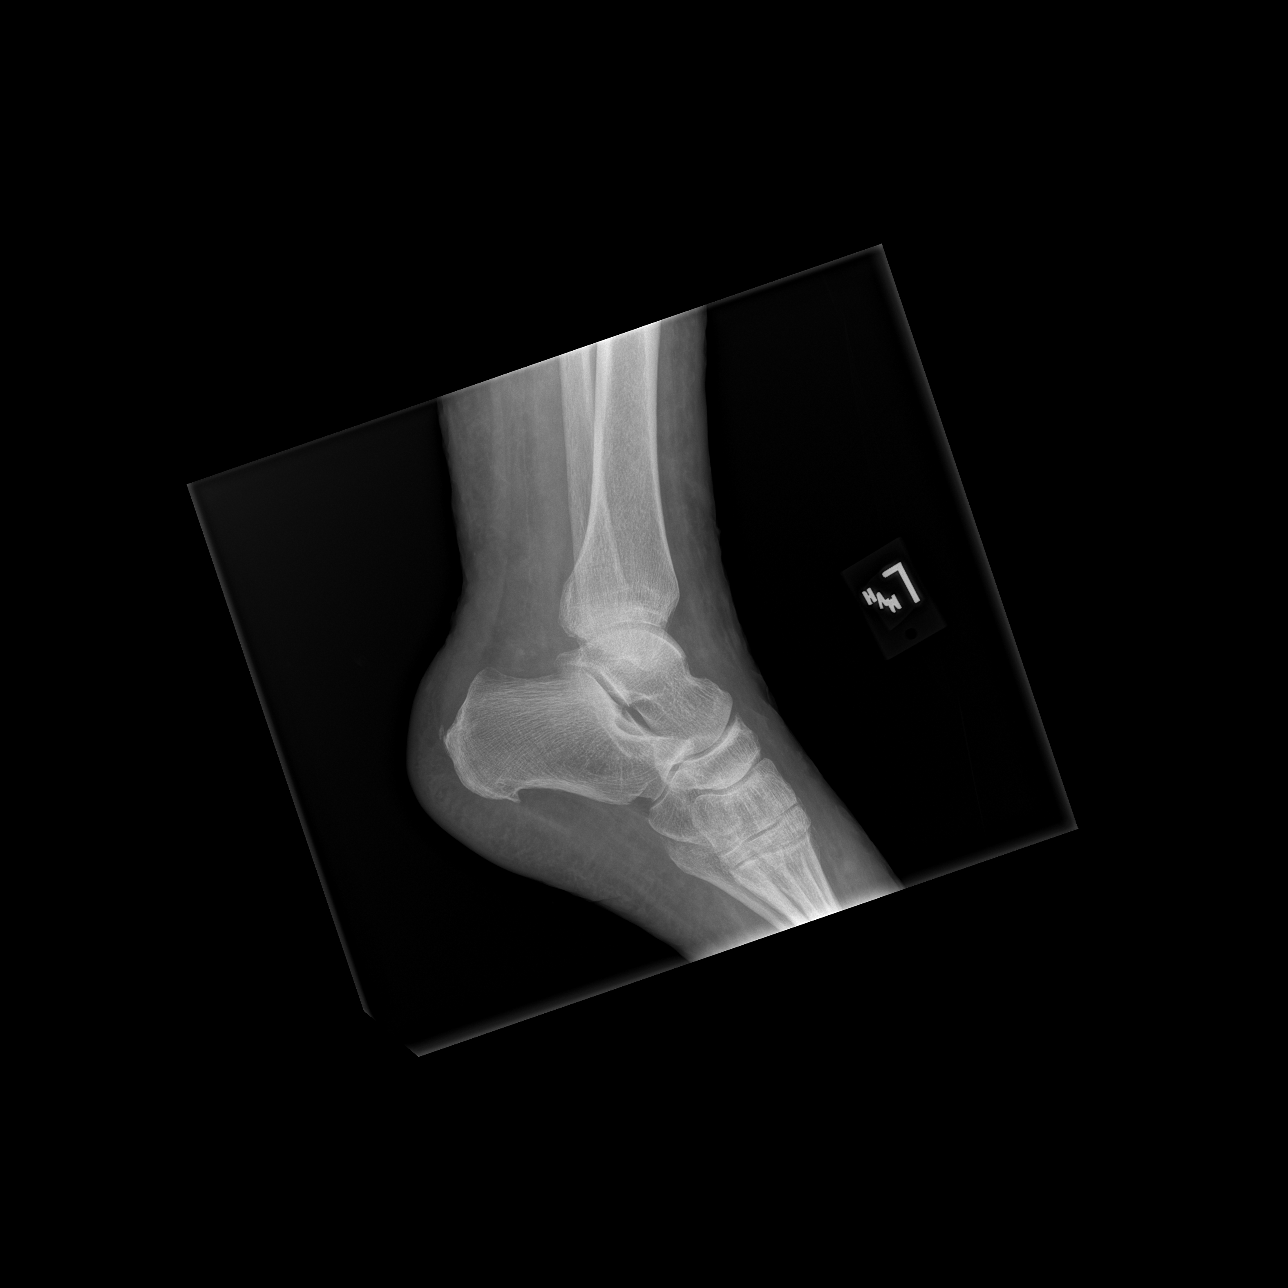

[x ankle ap left]
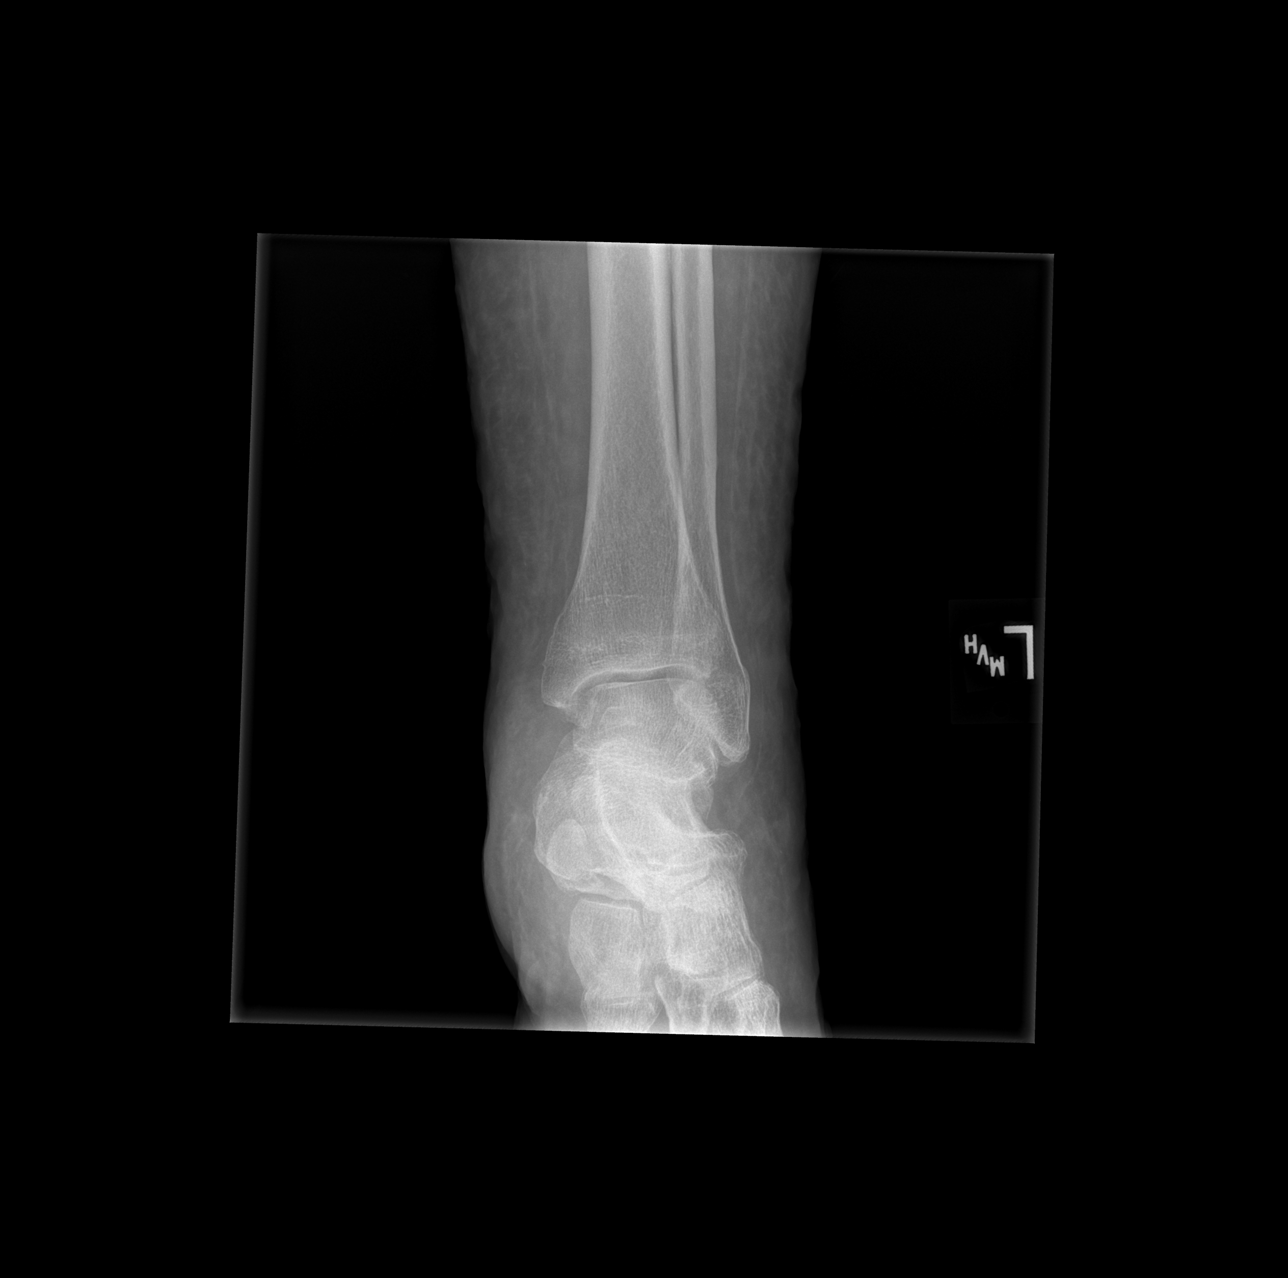

[x ankle obl left]
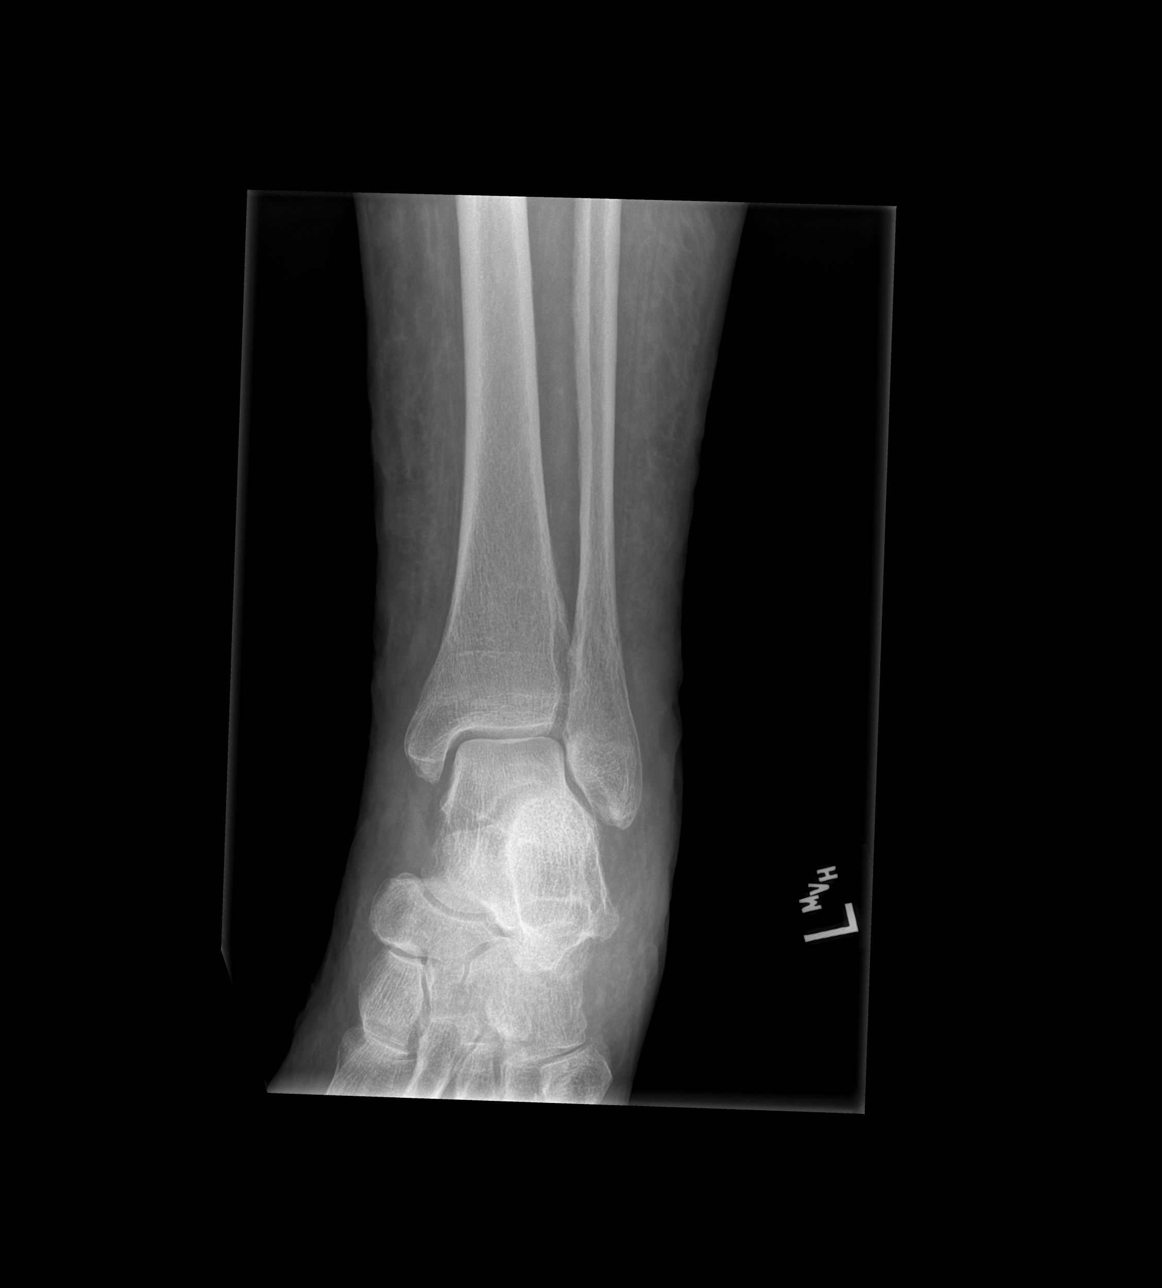

[3 of 3 positions shown; findings below may reference images not displayed]

FINDINGS: No erosion, periosteal reaction, or bone destruction. Normal ankle
alignment. Preserved ankle mortise. No fracture. Small plantar
calcaneal spur and Achilles tendon enthesophyte. Generalized soft
tissue edema. No radiopaque foreign body or soft tissue gas.
IMPRESSION: 1. Generalized soft tissue edema. No radiographic findings of
osteomyelitis.
2. Small plantar calcaneal spur and Achilles tendon enthesophyte.

## 2023-06-01 IMAGING — CR DG FOOT COMPLETE 3+V*L*
3 series · 3 of 3 positions shown · non-contrast
Comparison: None Available.

CLINICAL DATA: rule out osteo.  Pre-existing left foot wound.

EXAM:
LEFT FOOT - COMPLETE 3+ VIEW

[x ankle ap left]
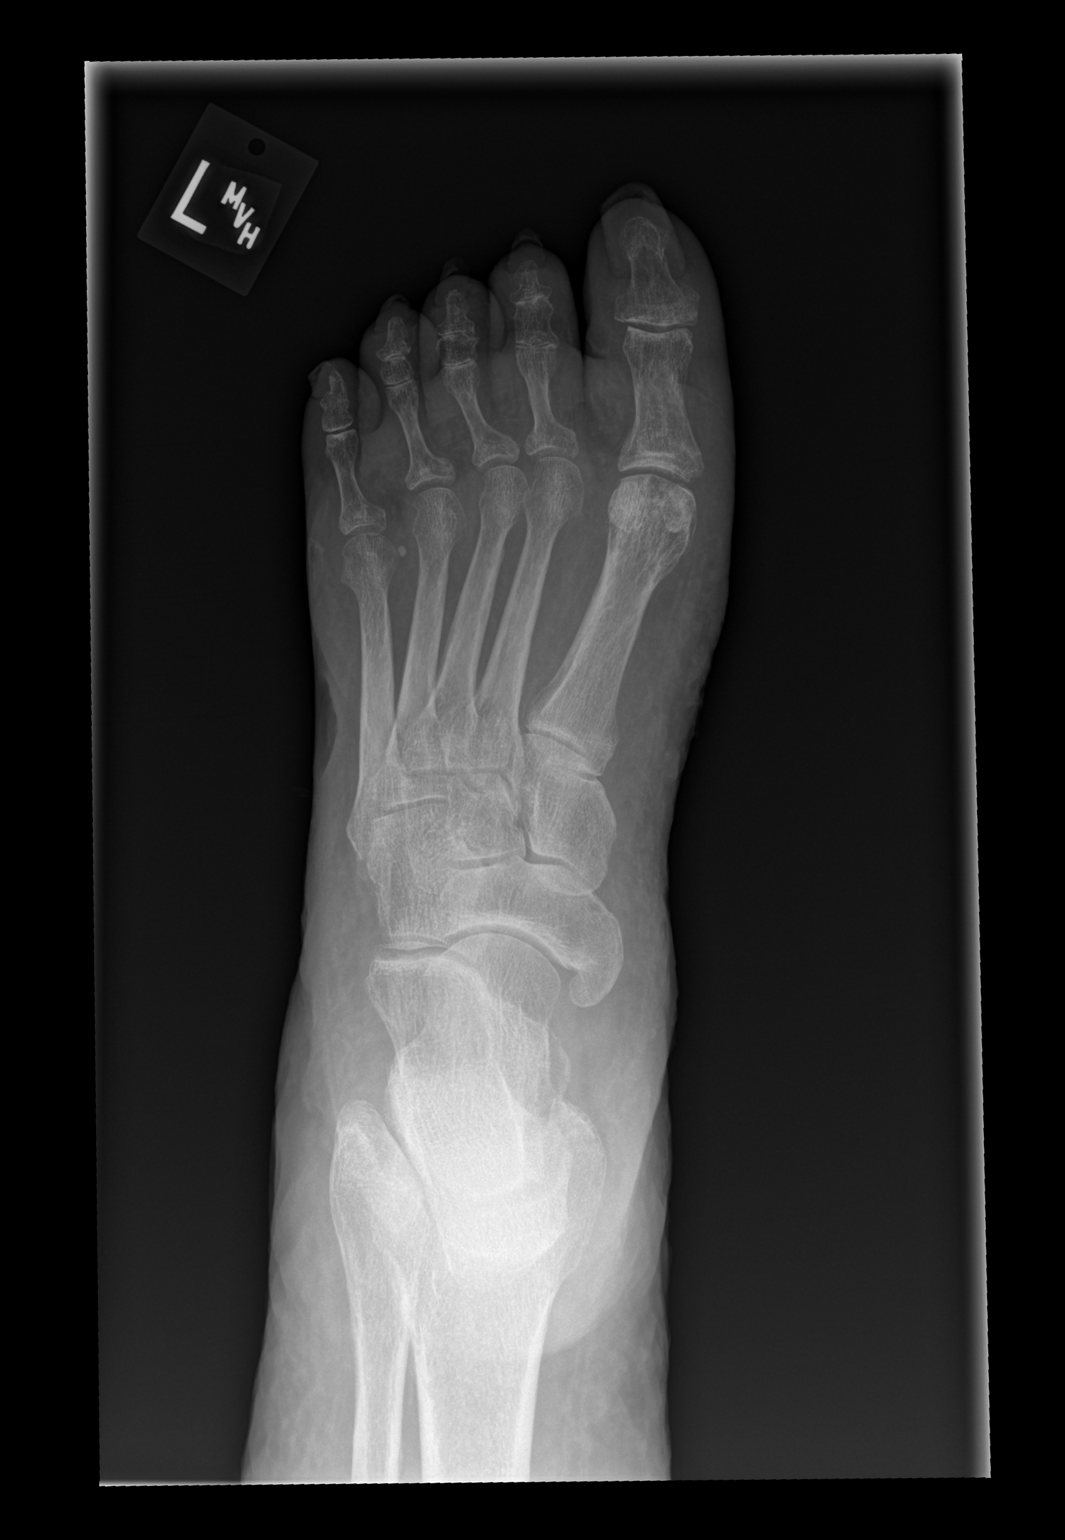

[x ankle obl left]
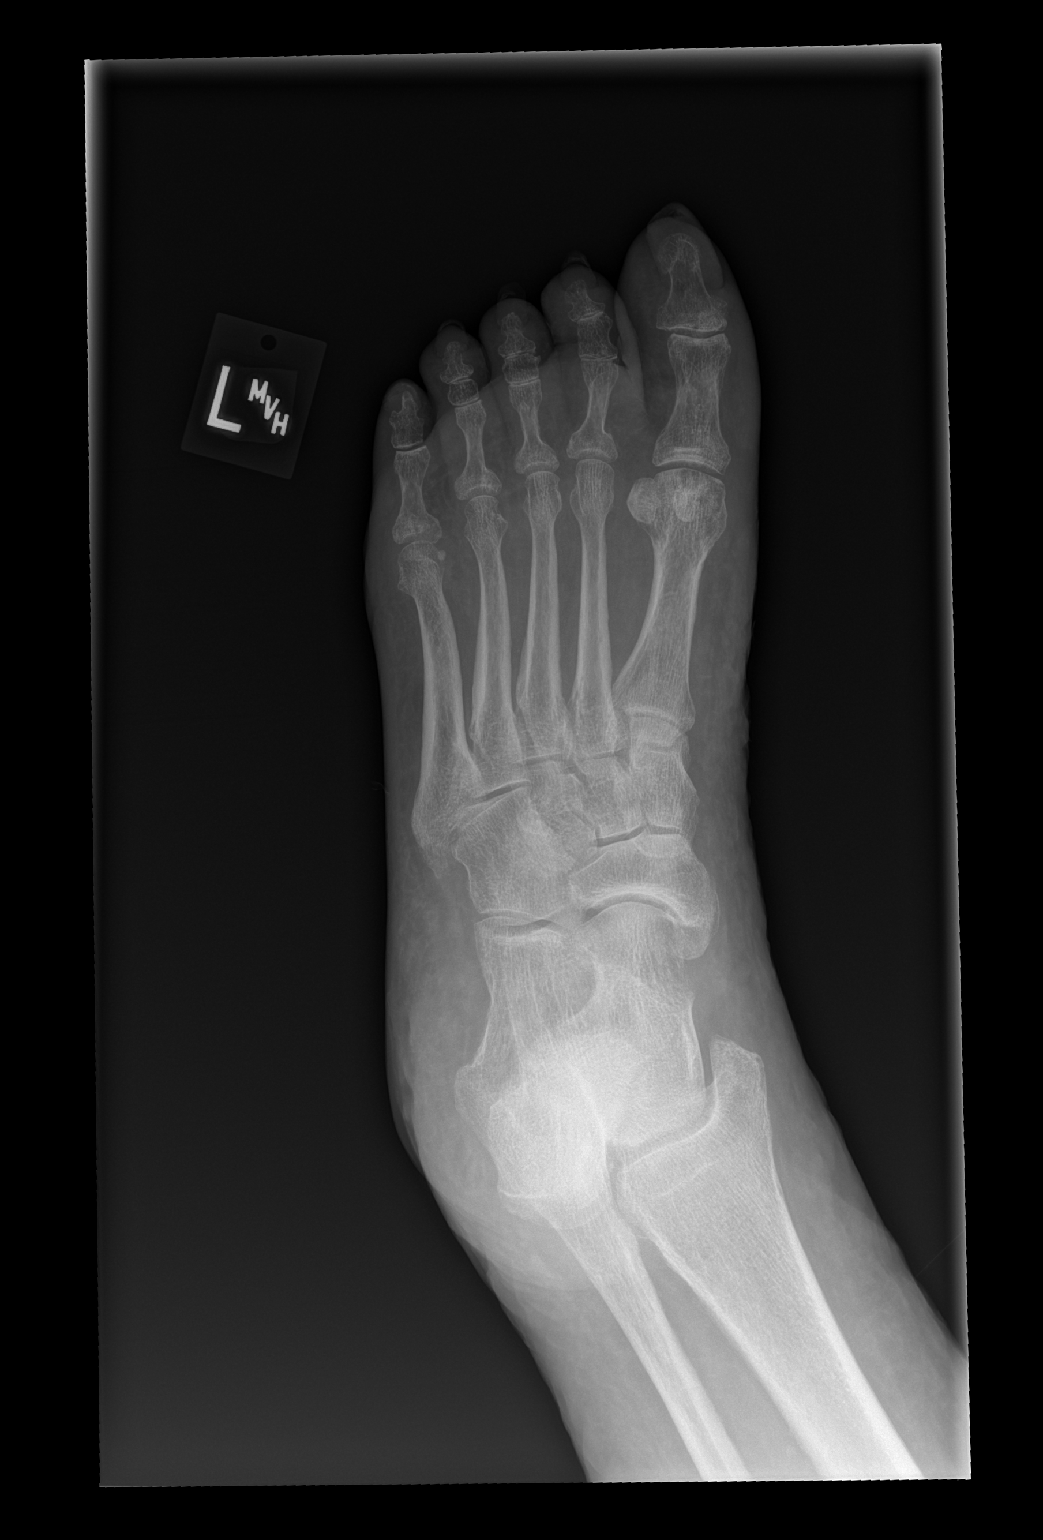

[x foot lat left]
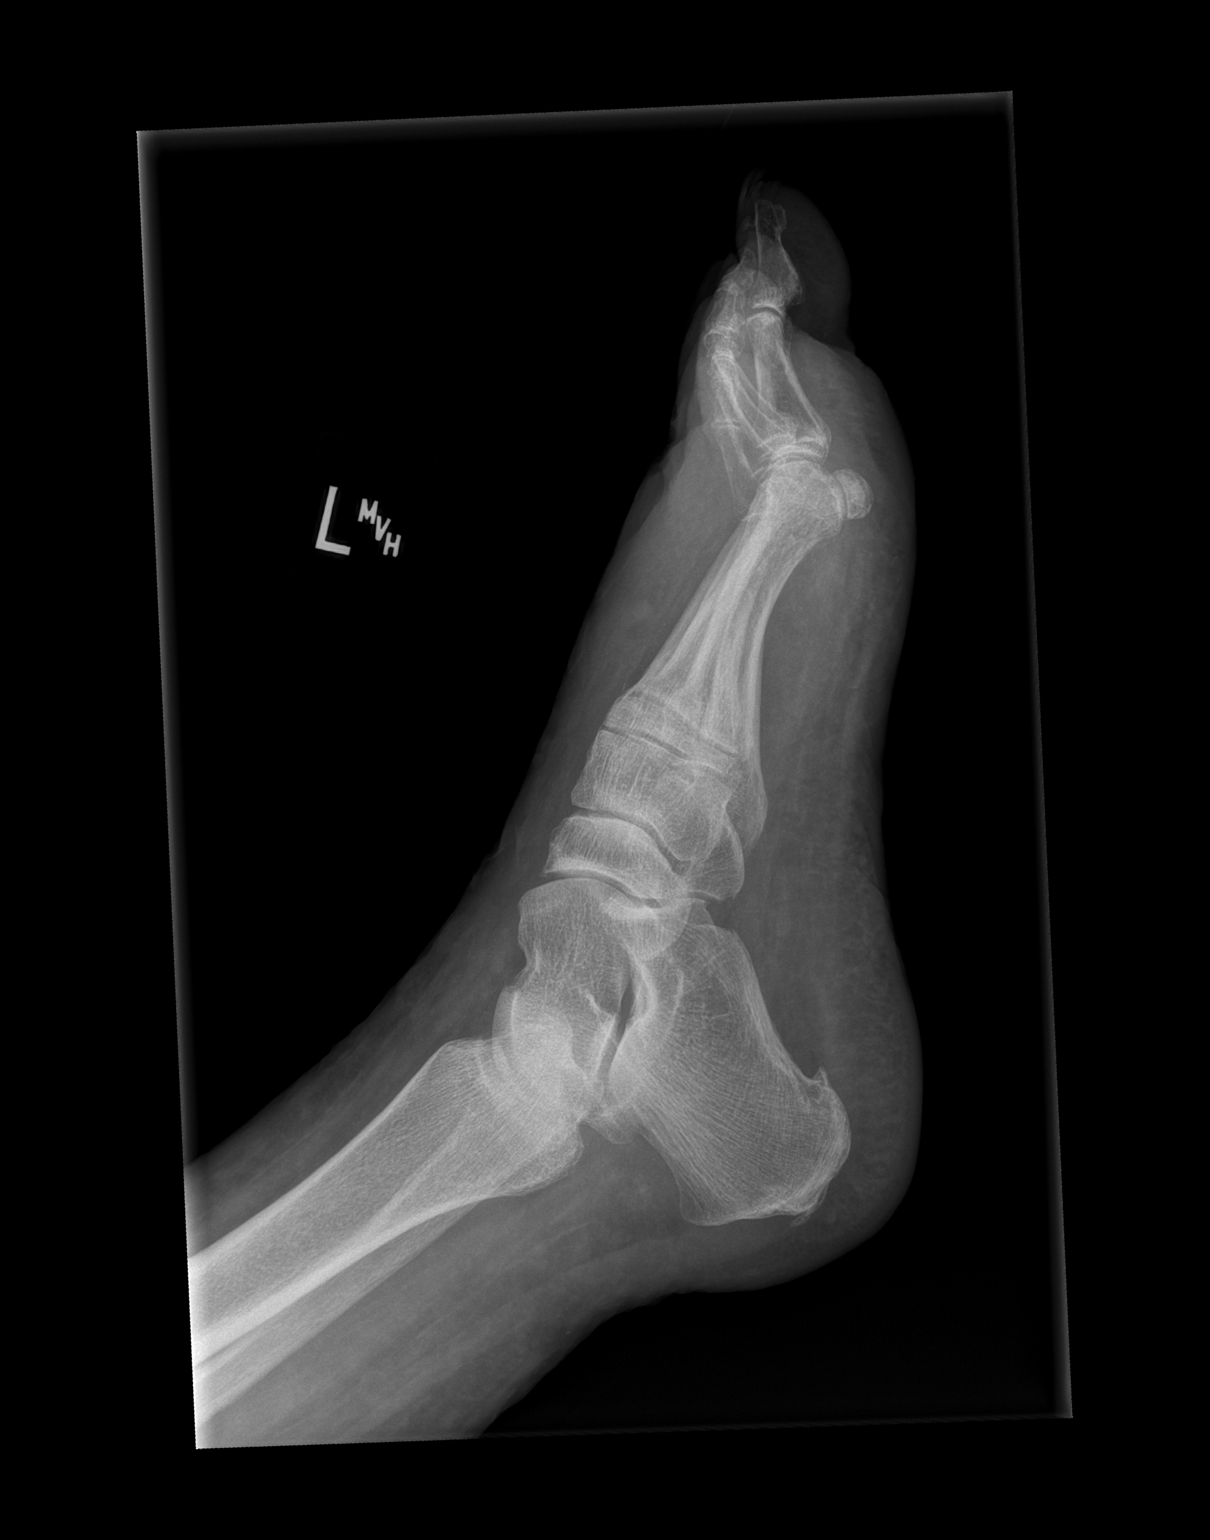

[3 of 3 positions shown; findings below may reference images not displayed]

FINDINGS: No cortical erosion or destruction. There is no evidence of fracture
or dislocation. Degenerative changes of the distal interphalangeal
joints. Diffuse mild subcutaneus soft tissue edema.
IMPRESSION: 1. No radiographic findings suggest osteomyelitis. If high clinical
suspicion, please consider MRI (with intravenous contrast if GFR
greater than 30).
2.  No acute displaced fracture or dislocation.

## 2024-08-02 ENCOUNTER — Encounter: Payer: Self-pay | Admitting: Family Medicine

## 2024-08-02 ENCOUNTER — Ambulatory Visit (INDEPENDENT_AMBULATORY_CARE_PROVIDER_SITE_OTHER): Admitting: Family Medicine

## 2024-08-02 VITALS — BP 132/82 | HR 87 | Temp 98.6°F | Ht 64.0 in | Wt 172.0 lb

## 2024-08-02 DIAGNOSIS — M5416 Radiculopathy, lumbar region: Secondary | ICD-10-CM | POA: Diagnosis not present

## 2024-08-02 DIAGNOSIS — J4489 Other specified chronic obstructive pulmonary disease: Secondary | ICD-10-CM

## 2024-08-02 DIAGNOSIS — K219 Gastro-esophageal reflux disease without esophagitis: Secondary | ICD-10-CM

## 2024-08-02 DIAGNOSIS — F329 Major depressive disorder, single episode, unspecified: Secondary | ICD-10-CM

## 2024-08-02 DIAGNOSIS — Z79899 Other long term (current) drug therapy: Secondary | ICD-10-CM | POA: Diagnosis not present

## 2024-08-02 DIAGNOSIS — Z0001 Encounter for general adult medical examination with abnormal findings: Secondary | ICD-10-CM | POA: Diagnosis not present

## 2024-08-02 DIAGNOSIS — Z0000 Encounter for general adult medical examination without abnormal findings: Secondary | ICD-10-CM | POA: Insufficient documentation

## 2024-08-02 NOTE — Assessment & Plan Note (Signed)

## 2024-08-02 NOTE — Assessment & Plan Note (Signed)
 Well controlled on wellbutrin  150mg  daily. No SI/HI.

## 2024-08-02 NOTE — Assessment & Plan Note (Signed)
 Well controlled on Omeprazole 20mg  daily. May trial off. Mag level today. Elevated HOB if needed and avoid lying down 2-3 hours after eating, avoid coffee, alcohol , chocolate, fatty foods, citrus, carbonated beverages, spicy foods, late meals, and smoking. Return to office if symptoms return or worsen and seek medical care for difficulty swallowing, bleeding, anemia, weight loss, or recurrent vomiting.

## 2024-08-02 NOTE — Assessment & Plan Note (Signed)
 Stable, continue Theophylline  400mg  daily. Theophylline  level today.

## 2024-08-02 NOTE — Assessment & Plan Note (Signed)
 Previously eval by Neurology, well controlled on Gabapentin . Will consider PT if symptoms worsen. No red flags on exam.   MRI 08/19/2017 showed: Abnormal MRI lumbar spine (without) demonstrating: 1. At L4-5: disc bulging and facet hypertrophy with severe spinal stenosis and mild biforaminal stenosis  2. At L3-4: disc bulging and facet hypertrophy with moderate-severe spinal stenosis and mild biforaminal stenosis  3. At L2-3: disc bulging and facet hypertrophy with mild spinal stenosis and no foraminal stenosis

## 2024-08-02 NOTE — Progress Notes (Signed)
 New Patient Office Visit  Subjective    Patient ID: [name], female    DOB: December 25, 1940  Age: 83 y.o. MRN: [id number]  CC:  Chief Complaint  Patient presents with   Establish Care    Chronic back pain diagnosed with lumbar radiculopathy     HPI [name] presents to establish care accompanied by a friend who assists with her health care. Oriented to practice routines and expectations. Has been seeing PCP. PMH includes severe venous stasis ulcers, lumbar radiculopathy, CKD 3a, MDD. No concerns today. She has aged out of routine cancer screenings with no history of cancer. Is followed by Optometry for macular degeneration and pseudohlakia. Declines shingles and Tdap today.  GERD well controlled on Omeprazole.   Chronic bronchitis: well controlled on theophylline   MDD: well controlled on Wellbutrin  150mg  daily  CKD 3a: avoiding NSAIDs, push fluids  Lumbar Radiculopathy: stable on Gabapentin , previously eval by Neurology Dr Onita, PT ordered at that time not completed  MRI 08/19/2017 showed: Abnormal MRI lumbar spine (without) demonstrating: 1. At L4-5: disc bulging and facet hypertrophy with severe spinal stenosis and mild biforaminal stenosis  2. At L3-4: disc bulging and facet hypertrophy with moderate-severe spinal stenosis and mild biforaminal stenosis  3. At L2-3: disc bulging and facet hypertrophy with mild spinal stenosis and no foraminal stenosis    Health Maintenance  Topic Date Due   DTaP/Tdap/Td (1 - Tdap) Never done   Zoster Vaccines- Shingrix (1 of 2) Never done   COVID-19 Vaccine (5 - Mixed Product risk 2024-25 season) 08/18/2024 (Originally 07/25/2024)   Influenza Vaccine  10/08/2024 (Originally 06/24/2024)   Pneumococcal Vaccine: 50+ Years  Completed   DEXA SCAN  Completed   HPV VACCINES  Aged Out   Meningococcal B Vaccine  Aged Out     Outpatient Encounter Medications as of 08/02/2024  Medication Sig   buPROPion  (WELLBUTRIN  XL) 150 MG 24 hr tablet  Take 150 mg by mouth daily.   diphenhydrAMINE  (BENADRYL ) 25 mg capsule Take 25 mg by mouth daily as needed for itching or allergies.   gabapentin  (NEURONTIN ) 100 MG capsule Take 2 capsules (200 mg total) by mouth 3 (three) times daily. (Patient taking differently: Take 300 mg by mouth 3 (three) times daily.)   guaiFENesin (MUCINEX) 600 MG 12 hr tablet Take 600 mg by mouth 2 (two) times daily.   Hyprom-Naphaz-Polysorb-Zn Sulf (CLEAR EYES COMPLETE OP) Place 1 drop into both eyes 2 (two) times daily as needed (redness).   multivitamin-iron-minerals-folic acid (THERAPEUTIC-M) TABS tablet Take 1 tablet by mouth daily.   omeprazole (PRILOSEC) 20 MG capsule Take 20 mg by mouth daily as needed (heartburn).   theophylline  (UNIPHYL) 400 MG 24 hr tablet Take 400 mg by mouth daily.   [DISCONTINUED] HYDROmorphone  (DILAUDID ) 1 MG/ML injection Inject 1 mL (1 mg total) into the vein every 6 (six) hours.   [DISCONTINUED] HYDROmorphone  (DILAUDID ) 1 MG/ML injection Inject 0.5-1 mLs (0.5-1 mg total) into the vein every 2 (two) hours as needed for moderate pain or severe pain.   [DISCONTINUED] polyethylene glycol (MIRALAX  / GLYCOLAX ) 17 g packet Take 17 g by mouth daily as needed for mild constipation. (Patient not taking: Reported on 04/28/2022)   [DISCONTINUED] senna-docusate (SENOKOT-S) 8.6-50 MG tablet Take 1 tablet by mouth 2 (two) times daily between meals as needed for mild constipation. (Patient not taking: Reported on 04/28/2022)   [DISCONTINUED] silver  sulfADIAZINE  (SILVADENE ) 1 % cream Apply topically 2 (two) times daily. (Patient taking differently: Apply 1 application. topically  2 (two) times daily.)   [DISCONTINUED] sodium hypochlorite (DAKIN'S 1/4 STRENGTH) 0.125 % SOLN Apply topically daily.   No facility-administered encounter medications on file as of 08/02/2024.    Past Medical History:  Diagnosis Date   Arthritis    Asthma    Cataract    Chronic renal impairment, stage 3 (moderate) (HCC)     Depression    GERD (gastroesophageal reflux disease)    Hearing loss    Hypertension    Neck pain    Numbness    Restless legs    Sciatica     Past Surgical History:  Procedure Laterality Date   arm surgery     due to dog bite   CATARACT EXTRACTION, BILATERAL     EYE SURGERY     OTHER SURGICAL HISTORY     bone graft - jaw    TOOTH EXTRACTION      Family History  Problem Relation Age of Onset   Stroke Mother    Heart disease Mother    Hypertension Father    Heart disease Father    Heart attack Father    Cancer Sister        unsure of type    Social History   Socioeconomic History   Marital status: Widowed    Spouse name: Not on file   Number of children: 2   Years of education: associates degree   Highest education level: Associate degree: academic program  Occupational History   Occupation: Retired  Tobacco Use   Smoking status: Former    Current packs/day: 0.00    Average packs/day: 1 pack/day for 39.0 years (39.0 ttl pk-yrs)    Types: Cigarettes    Quit date: 09/23/1998    Years since quitting: 25.8   Smokeless tobacco: Never  Vaping Use   Vaping status: Never Used  Substance and Sexual Activity   Alcohol  use: Yes    Alcohol /week: 4.0 standard drinks of alcohol     Types: 4 Glasses of wine per week    Comment: 4 drinks per day- irish cream before bed   Drug use: No   Sexual activity: Never  Other Topics Concern   Not on file  Social History Narrative   Lives at home with husband.   Right-handed.   No caffeine use.   Social Drivers of Corporate investment banker Strain: Low Risk  (07/28/2024)   Overall Financial Resource Strain (CARDIA)    Difficulty of Paying Living Expenses: Not hard at all  Food Insecurity: No Food Insecurity (07/28/2024)   Hunger Vital Sign    Worried About Running Out of Food in the Last Year: Never true    Ran Out of Food in the Last Year: Never true  Transportation Needs: No Transportation Needs (07/28/2024)   PRAPARE -  Administrator, Civil Service (Medical): No    Lack of Transportation (Non-Medical): No  Physical Activity: Inactive (07/28/2024)   Exercise Vital Sign    Days of Exercise per Week: 0 days    Minutes of Exercise per Session: Not on file  Stress: No Stress Concern Present (07/28/2024)   [employer] of Occupational Health - Occupational Stress Questionnaire    Feeling of Stress: Not at all  Social Connections: Socially Isolated (07/28/2024)   Social Connection and Isolation Panel    Frequency of Communication with Friends and Family: Never    Frequency of Social Gatherings with Friends and Family: Once a week    Attends Religious  Services: Never    Active Member of Clubs or Organizations: No    Attends Banker Meetings: Not on file    Marital Status: Widowed  Intimate Partner Violence: Not on file    Review of Systems  Constitutional: Negative.   HENT: Negative.    Eyes: Negative.   Respiratory:  Positive for cough.   Cardiovascular: Negative.   Gastrointestinal: Negative.   Genitourinary: Negative.   Musculoskeletal:  Positive for back pain.  Skin: Negative.   Neurological: Negative.   Endo/Heme/Allergies: Negative.   Psychiatric/Behavioral: Negative.    All other systems reviewed and are negative.       Objective    BP 132/82   Pulse 87   Temp 98.6 F (37 C)   Ht 5' 4 (1.626 m)   Wt 172 lb 0.3 oz (78 kg)   SpO2 96%   BMI 29.53 kg/m   Physical Exam Vitals and nursing note reviewed.  Constitutional:      Appearance: Normal appearance. She is obese.  HENT:     Head: Normocephalic and atraumatic.     Right Ear: Tympanic membrane, ear canal and external ear normal.     Left Ear: Tympanic membrane, ear canal and external ear normal.     Nose: Nose normal.     Mouth/Throat:     Mouth: Mucous membranes are moist.     Pharynx: Oropharynx is clear.  Eyes:     Extraocular Movements: Extraocular movements intact.     Conjunctiva/sclera:  Conjunctivae normal.     Pupils: Pupils are equal, round, and reactive to light.  Cardiovascular:     Rate and Rhythm: Normal rate and regular rhythm.     Pulses: Normal pulses.     Heart sounds: Normal heart sounds.  Pulmonary:     Effort: Pulmonary effort is normal.     Breath sounds: Examination of the left-upper field reveals rhonchi. Examination of the left-lower field reveals rhonchi. Rhonchi present.  Abdominal:     General: Bowel sounds are normal.     Palpations: Abdomen is soft.  Musculoskeletal:        General: Normal range of motion.     Cervical back: Normal, normal range of motion and neck supple.     Thoracic back: Normal.     Lumbar back: No spasms or bony tenderness. Normal range of motion.  Skin:    General: Skin is warm and dry.     Capillary Refill: Capillary refill takes less than 2 seconds.  Neurological:     General: No focal deficit present.     Mental Status: She is alert and oriented to person, place, and time. Mental status is at baseline.  Psychiatric:        Mood and Affect: Mood normal.        Behavior: Behavior normal.        Thought Content: Thought content normal.        Judgment: Judgment normal.         Assessment & Plan:   Problem List Items Addressed This Visit     Lumbar radiculopathy   Previously eval by Neurology, well controlled on Gabapentin . Will consider PT if symptoms worsen. No red flags on exam.   MRI 08/19/2017 showed: Abnormal MRI lumbar spine (without) demonstrating: 1. At L4-5: disc bulging and facet hypertrophy with severe spinal stenosis and mild biforaminal stenosis  2. At L3-4: disc bulging and facet hypertrophy with moderate-severe spinal stenosis and mild biforaminal stenosis  3. At L2-3: disc bulging and facet hypertrophy with mild spinal stenosis and no foraminal stenosis       Chronic major depressive disorder   Well controlled on wellbutrin  150mg  daily. No SI/HI.       GERD (gastroesophageal reflux  disease)   Well controlled on Omeprazole 20mg  daily. May trial off. Mag level today. Elevated HOB if needed and avoid lying down 2-3 hours after eating, avoid coffee, alcohol , chocolate, fatty foods, citrus, carbonated beverages, spicy foods, late meals, and smoking. Return to office if symptoms return or worsen and seek medical care for difficulty swallowing, bleeding, anemia, weight loss, or recurrent vomiting.        Chronic obstructive asthma (HCC)   Stable, continue Theophylline  400mg  daily. Theophylline  level today.       Relevant Medications   guaiFENesin (MUCINEX) 600 MG 12 hr tablet   Physical exam, annual - Primary   Today your medical history was reviewed and routine physical exam with labs was performed. Recommend 150 minutes of moderate intensity exercise weekly and consuming a well-balanced diet. Advised to stop smoking if a smoker, avoid smoking if a non-smoker, limit alcohol  consumption to 1 drink per day for women and 2 drinks per day for men, and avoid illicit drug use. Counseled in mental health awareness and when to seek medical care. Vaccine maintenance discussed. Appropriate health maintenance items reviewed. Return to office in 1 year for annual physical exam.       Relevant Orders   CBC with Differential/Platelet   Comprehensive metabolic panel with GFR   Lipid panel   TSH   Hemoglobin A1c   Vitamin D , 25-OH,Total,IA(Refl)   Theophylline  level   Magnesium   Other Visit Diagnoses       Medication management       Relevant Orders   Vitamin D , 25-OH,Total,IA(Refl)   Theophylline  level   Magnesium       Return in about 6 months (around 01/30/2025) for chronic follow-up with labs 1 week prior.   Jeoffrey GORMAN Barrio, FNP

## 2024-08-02 NOTE — Patient Instructions (Signed)
 It was great to meet you today and I'm excited to have you join the [facility] practice. I hope you had a positive experience today! If you feel so inclined, please feel free to recommend our practice to friends and family. Jeoffrey Barrio, FNP-C

## 2024-08-03 ENCOUNTER — Ambulatory Visit: Payer: Self-pay | Admitting: Family Medicine

## 2024-08-03 LAB — COMPREHENSIVE METABOLIC PANEL WITH GFR
AG Ratio: 1.8 (calc) (ref 1.0–2.5)
ALT: 8 U/L (ref 6–29)
AST: 15 U/L (ref 10–35)
Albumin: 4.2 g/dL (ref 3.6–5.1)
Alkaline phosphatase (APISO): 51 U/L (ref 37–153)
BUN: 18 mg/dL (ref 7–25)
CO2: 29 mmol/L (ref 20–32)
Calcium: 9.9 mg/dL (ref 8.6–10.4)
Chloride: 102 mmol/L (ref 98–110)
Creat: 0.87 mg/dL (ref 0.60–0.95)
Globulin: 2.4 g/dL (ref 1.9–3.7)
Glucose, Bld: 95 mg/dL (ref 65–99)
Potassium: 4.6 mmol/L (ref 3.5–5.3)
Sodium: 139 mmol/L (ref 135–146)
Total Bilirubin: 0.5 mg/dL (ref 0.2–1.2)
Total Protein: 6.6 g/dL (ref 6.1–8.1)
eGFR: 66 mL/min/1.73m2 (ref >=60)

## 2024-08-03 LAB — CBC WITH DIFFERENTIAL/PLATELET
Absolute Lymphocytes: 1433 {cells}/uL (ref 850–3900)
Absolute Monocytes: 742 {cells}/uL (ref 200–950)
Basophils Absolute: 50 {cells}/uL (ref 0–200)
Basophils Relative: 0.7 %
Eosinophils Absolute: 202 {cells}/uL (ref 15–500)
Eosinophils Relative: 2.8 %
HCT: 43.3 % (ref 35.0–45.0)
Hemoglobin: 14.6 g/dL (ref 11.7–15.5)
MCH: 32.4 pg (ref 27.0–33.0)
MCHC: 33.7 g/dL (ref 32.0–36.0)
MCV: 96.2 fL (ref 80.0–100.0)
MPV: 10 fL (ref 7.5–12.5)
Monocytes Relative: 10.3 %
Neutro Abs: 4774 {cells}/uL (ref 1500–7800)
Neutrophils Relative %: 66.3 %
Platelets: 262 Thousand/uL (ref 140–400)
RBC: 4.5 Million/uL (ref 3.80–5.10)
RDW: 12.2 % (ref 11.0–15.0)
Total Lymphocyte: 19.9 %
WBC: 7.2 Thousand/uL (ref 3.8–10.8)

## 2024-08-03 LAB — TSH: TSH: 3.28 m[IU]/L (ref 0.40–4.50)

## 2024-08-03 LAB — LIPID PANEL
Cholesterol: 161 mg/dL (ref <200)
HDL: 67 mg/dL (ref >=50)
LDL Cholesterol (Calc): 75 mg/dL
Non-HDL Cholesterol (Calc): 94 mg/dL (ref <130)
Total CHOL/HDL Ratio: 2.4 (calc) (ref <5.0)
Triglycerides: 109 mg/dL (ref <150)

## 2024-08-03 LAB — TIQ-MISC: QUESTION:: 92918

## 2024-08-03 LAB — TEST AUTHORIZATION

## 2024-08-03 LAB — THEOPHYLLINE LEVEL: Theophylline Lvl: 7.4 mg/L — ABNORMAL LOW (ref 10.0–20.0)

## 2024-08-03 LAB — HEMOGLOBIN A1C
Hgb A1c MFr Bld: 5.4 % (ref <5.7)
Mean Plasma Glucose: 108 mg/dL
eAG (mmol/L): 6 mmol/L

## 2024-08-03 LAB — VITAMIN D 25 HYDROXY (VIT D DEFICIENCY, FRACTURES): Vit D, 25-Hydroxy: 37 ng/mL (ref 30–100)

## 2024-08-03 LAB — MAGNESIUM: Magnesium: 2 mg/dL (ref 1.5–2.5)

## 2024-11-03 ENCOUNTER — Encounter

## 2024-12-01 ENCOUNTER — Encounter

## 2024-12-01 ENCOUNTER — Other Ambulatory Visit: Payer: Self-pay | Admitting: Family Medicine

## 2024-12-01 NOTE — Telephone Encounter (Unsigned)
 Copied from CRM #[id number]. Topic: Clinical - Medication Refill >> Dec 01, 2024  2:11 PM Willma R wrote: Medication: theophylline  (UNIPHYL) 400 MG 24 hr tablet *prescribed by her previous pcp  Has the patient contacted their pharmacy? Yes  This is the patient's preferred pharmacy:  [id number] - [place], [place] - [address] [place] [address] Phone: [phone] Fax: [phone]  Is this the correct pharmacy for this prescription? Yes  Has the prescription been filled recently? No  Is the patient out of the medication? Yes  Has the patient been seen for an appointment in the last year OR does the patient have an upcoming appointment? Yes  Can we respond through MyChart? Yes  Agent: Please be advised that Rx refills may take up to 3 business days. We ask that you follow-up with your pharmacy.

## 2024-12-02 NOTE — Telephone Encounter (Signed)
 Requested medication (s) are due for refill today: routing for review  Requested medication (s) are on the active medication list: no  Last refill:  07/30/17  Future visit scheduled: yes  Notes to clinic:  historical medication     Requested Prescriptions  Pending Prescriptions Disp Refills   theophylline  (UNIPHYL) 400 MG 24 hr tablet      Sig: Take 1 tablet (400 mg total) by mouth daily.     Pulmonology:  Theophyllines Failed - 12/02/2024 11:35 AM      Failed - Theophylline  (serum) in normal range and within 360 days    Theophylline  Lvl  Date Value Ref Range Status  08/02/2024 7.4 (L) 10.0 - 20.0 mg/L Final         Passed - Valid encounter within last 12 months    Recent Outpatient Visits           4 months ago Physical exam, annual   [facility] Kayla Jeoffrey RAMAN, FNP

## 2025-02-01 ENCOUNTER — Ambulatory Visit: Admitting: Family Medicine

## 2025-02-01 ENCOUNTER — Ambulatory Visit
